# Patient Record
Sex: Female | Born: 1965 | Race: White | Hispanic: No | Marital: Single | Smoking: Current every day smoker
Health system: Southern US, Community
[De-identification: ages and names within clinical notes are randomized; demographics above are authoritative.]

## PROBLEM LIST (undated history)

## (undated) DIAGNOSIS — F419 Anxiety disorder, unspecified: Secondary | ICD-10-CM

## (undated) DIAGNOSIS — F32A Depression, unspecified: Secondary | ICD-10-CM

## (undated) DIAGNOSIS — E282 Polycystic ovarian syndrome: Secondary | ICD-10-CM

## (undated) DIAGNOSIS — G629 Polyneuropathy, unspecified: Secondary | ICD-10-CM

## (undated) DIAGNOSIS — M1990 Unspecified osteoarthritis, unspecified site: Secondary | ICD-10-CM

## (undated) DIAGNOSIS — R42 Dizziness and giddiness: Secondary | ICD-10-CM

## (undated) DIAGNOSIS — G43909 Migraine, unspecified, not intractable, without status migrainosus: Secondary | ICD-10-CM

## (undated) DIAGNOSIS — F988 Other specified behavioral and emotional disorders with onset usually occurring in childhood and adolescence: Secondary | ICD-10-CM

## (undated) DIAGNOSIS — E785 Hyperlipidemia, unspecified: Secondary | ICD-10-CM

## (undated) DIAGNOSIS — F329 Major depressive disorder, single episode, unspecified: Secondary | ICD-10-CM

## (undated) DIAGNOSIS — E119 Type 2 diabetes mellitus without complications: Secondary | ICD-10-CM

## (undated) DIAGNOSIS — H93A9 Pulsatile tinnitus, unspecified ear: Secondary | ICD-10-CM

## (undated) HISTORY — PX: WISDOM TOOTH EXTRACTION: SHX21

## (undated) HISTORY — DX: Pulsatile tinnitus, unspecified ear: H93.A9

## (undated) HISTORY — DX: Polycystic ovarian syndrome: E28.2

## (undated) HISTORY — DX: Hyperlipidemia, unspecified: E78.5

## (undated) HISTORY — DX: Dizziness and giddiness: R42

## (undated) HISTORY — DX: Major depressive disorder, single episode, unspecified: F32.9

## (undated) HISTORY — PX: BUNIONECTOMY: SHX129

## (undated) HISTORY — DX: Polyneuropathy, unspecified: G62.9

## (undated) HISTORY — DX: Unspecified osteoarthritis, unspecified site: M19.90

## (undated) HISTORY — DX: Depression, unspecified: F32.A

## (undated) HISTORY — DX: Anxiety disorder, unspecified: F41.9

## (undated) HISTORY — DX: Migraine, unspecified, not intractable, without status migrainosus: G43.909

## (undated) HISTORY — PX: OTHER SURGICAL HISTORY: SHX169

## (undated) HISTORY — DX: Type 2 diabetes mellitus without complications: E11.9

## (undated) HISTORY — DX: Other specified behavioral and emotional disorders with onset usually occurring in childhood and adolescence: F98.8

---

## 1997-08-13 ENCOUNTER — Other Ambulatory Visit: Admission: RE | Admit: 1997-08-13 | Discharge: 1997-08-13 | Payer: Self-pay | Admitting: Obstetrics and Gynecology

## 2000-02-18 ENCOUNTER — Encounter: Admission: RE | Admit: 2000-02-18 | Discharge: 2000-03-16 | Payer: Self-pay | Admitting: Family Medicine

## 2000-04-21 ENCOUNTER — Encounter: Admission: RE | Admit: 2000-04-21 | Discharge: 2000-07-20 | Payer: Self-pay | Admitting: Family Medicine

## 2001-02-28 ENCOUNTER — Encounter: Admission: RE | Admit: 2001-02-28 | Discharge: 2001-05-29 | Payer: Self-pay | Admitting: Family Medicine

## 2001-11-02 ENCOUNTER — Encounter: Admission: RE | Admit: 2001-11-02 | Discharge: 2002-01-31 | Payer: Self-pay | Admitting: Family Medicine

## 2003-02-12 ENCOUNTER — Encounter: Payer: Self-pay | Admitting: Obstetrics and Gynecology

## 2003-02-12 ENCOUNTER — Ambulatory Visit (HOSPITAL_COMMUNITY): Admission: RE | Admit: 2003-02-12 | Discharge: 2003-02-12 | Payer: Self-pay | Admitting: Obstetrics and Gynecology

## 2003-03-14 ENCOUNTER — Encounter: Admission: RE | Admit: 2003-03-14 | Discharge: 2003-06-12 | Payer: Self-pay | Admitting: Family Medicine

## 2003-09-14 ENCOUNTER — Emergency Department (HOSPITAL_COMMUNITY): Admission: EM | Admit: 2003-09-14 | Discharge: 2003-09-14 | Payer: Self-pay | Admitting: Family Medicine

## 2004-02-28 ENCOUNTER — Encounter: Admission: RE | Admit: 2004-02-28 | Discharge: 2004-04-30 | Payer: Self-pay | Admitting: Family Medicine

## 2004-04-21 ENCOUNTER — Ambulatory Visit (HOSPITAL_COMMUNITY): Admission: RE | Admit: 2004-04-21 | Discharge: 2004-04-21 | Payer: Self-pay | Admitting: Obstetrics and Gynecology

## 2005-02-12 ENCOUNTER — Encounter: Admission: RE | Admit: 2005-02-12 | Discharge: 2005-02-12 | Payer: Self-pay | Admitting: Family Medicine

## 2005-06-09 ENCOUNTER — Other Ambulatory Visit: Admission: RE | Admit: 2005-06-09 | Discharge: 2005-06-09 | Payer: Self-pay | Admitting: Obstetrics and Gynecology

## 2006-08-12 ENCOUNTER — Encounter: Admission: RE | Admit: 2006-08-12 | Discharge: 2006-08-12 | Payer: Self-pay | Admitting: Family Medicine

## 2007-01-05 ENCOUNTER — Other Ambulatory Visit: Admission: RE | Admit: 2007-01-05 | Discharge: 2007-01-05 | Payer: Self-pay | Admitting: Obstetrics and Gynecology

## 2007-01-26 ENCOUNTER — Encounter: Admission: RE | Admit: 2007-01-26 | Discharge: 2007-01-26 | Payer: Self-pay | Admitting: Obstetrics and Gynecology

## 2007-04-06 ENCOUNTER — Encounter: Admission: RE | Admit: 2007-04-06 | Discharge: 2007-05-18 | Payer: Self-pay | Admitting: Orthopaedic Surgery

## 2007-09-06 ENCOUNTER — Encounter: Admission: RE | Admit: 2007-09-06 | Discharge: 2007-09-06 | Payer: Self-pay | Admitting: Family Medicine

## 2008-01-27 ENCOUNTER — Encounter: Admission: RE | Admit: 2008-01-27 | Discharge: 2008-01-27 | Payer: Self-pay | Admitting: Family Medicine

## 2008-01-27 ENCOUNTER — Other Ambulatory Visit: Admission: RE | Admit: 2008-01-27 | Discharge: 2008-01-27 | Payer: Self-pay | Admitting: Obstetrics and Gynecology

## 2008-09-04 ENCOUNTER — Ambulatory Visit (HOSPITAL_COMMUNITY): Admission: RE | Admit: 2008-09-04 | Discharge: 2008-09-04 | Payer: Self-pay | Admitting: Family Medicine

## 2009-04-08 ENCOUNTER — Encounter: Admission: RE | Admit: 2009-04-08 | Discharge: 2009-04-08 | Payer: Self-pay | Admitting: Family Medicine

## 2009-05-03 ENCOUNTER — Other Ambulatory Visit: Admission: RE | Admit: 2009-05-03 | Discharge: 2009-05-03 | Payer: Self-pay | Admitting: Obstetrics and Gynecology

## 2009-10-19 ENCOUNTER — Encounter
Admission: RE | Admit: 2009-10-19 | Discharge: 2009-10-19 | Payer: Self-pay | Admitting: Physical Medicine and Rehabilitation

## 2009-10-23 ENCOUNTER — Encounter
Admission: RE | Admit: 2009-10-23 | Discharge: 2010-01-08 | Payer: Self-pay | Source: Home / Self Care | Admitting: Physical Medicine and Rehabilitation

## 2010-03-11 ENCOUNTER — Emergency Department (HOSPITAL_COMMUNITY): Admission: EM | Admit: 2010-03-11 | Discharge: 2010-03-11 | Payer: Self-pay | Admitting: Emergency Medicine

## 2010-06-08 ENCOUNTER — Encounter: Payer: Self-pay | Admitting: Family Medicine

## 2010-06-10 ENCOUNTER — Other Ambulatory Visit
Admission: RE | Admit: 2010-06-10 | Discharge: 2010-06-10 | Payer: Self-pay | Source: Home / Self Care | Admitting: Obstetrics and Gynecology

## 2010-06-10 ENCOUNTER — Other Ambulatory Visit: Payer: Self-pay | Admitting: Obstetrics and Gynecology

## 2010-06-10 ENCOUNTER — Encounter
Admission: RE | Admit: 2010-06-10 | Discharge: 2010-06-10 | Payer: Self-pay | Source: Home / Self Care | Attending: Family Medicine | Admitting: Family Medicine

## 2010-07-30 LAB — URINALYSIS, ROUTINE W REFLEX MICROSCOPIC
Bilirubin Urine: NEGATIVE
Glucose, UA: NEGATIVE mg/dL
Hgb urine dipstick: NEGATIVE
Ketones, ur: NEGATIVE mg/dL
Nitrite: NEGATIVE
Protein, ur: NEGATIVE mg/dL
Specific Gravity, Urine: 1.01 (ref 1.005–1.030)
Urobilinogen, UA: 0.2 mg/dL (ref 0.0–1.0)
pH: 5.5 (ref 5.0–8.0)

## 2010-07-30 LAB — DIFFERENTIAL
Basophils Absolute: 0.1 10*3/uL (ref 0.0–0.1)
Basophils Relative: 1 % (ref 0–1)
Eosinophils Absolute: 0.4 10*3/uL (ref 0.0–0.7)
Eosinophils Relative: 3 % (ref 0–5)
Lymphocytes Relative: 25 % (ref 12–46)
Lymphs Abs: 3.4 10*3/uL (ref 0.7–4.0)
Monocytes Absolute: 0.9 10*3/uL (ref 0.1–1.0)
Monocytes Relative: 7 % (ref 3–12)
Neutro Abs: 9.2 10*3/uL — ABNORMAL HIGH (ref 1.7–7.7)
Neutrophils Relative %: 66 % (ref 43–77)

## 2010-07-30 LAB — LIPASE, BLOOD: Lipase: 21 U/L (ref 11–59)

## 2010-07-30 LAB — COMPREHENSIVE METABOLIC PANEL
ALT: 20 U/L (ref 0–35)
AST: 25 U/L (ref 0–37)
Albumin: 3.3 g/dL — ABNORMAL LOW (ref 3.5–5.2)
Alkaline Phosphatase: 79 U/L (ref 39–117)
BUN: 7 mg/dL (ref 6–23)
CO2: 24 mEq/L (ref 19–32)
Calcium: 9.3 mg/dL (ref 8.4–10.5)
Chloride: 103 mEq/L (ref 96–112)
Creatinine, Ser: 0.64 mg/dL (ref 0.4–1.2)
GFR calc Af Amer: >60 mL/min (ref >60)
GFR calc non Af Amer: >60 mL/min (ref >60)
Glucose, Bld: 103 mg/dL — ABNORMAL HIGH (ref 70–99)
Potassium: 4.3 mEq/L (ref 3.5–5.1)
Sodium: 135 mEq/L (ref 135–145)
Total Bilirubin: 0.9 mg/dL (ref 0.3–1.2)
Total Protein: 7 g/dL (ref 6.0–8.3)

## 2010-07-30 LAB — GLUCOSE, CAPILLARY: Glucose-Capillary: 106 mg/dL — ABNORMAL HIGH (ref 70–99)

## 2010-07-30 LAB — CBC
HCT: 37.1 % (ref 36.0–46.0)
Hemoglobin: 12.3 g/dL (ref 12.0–15.0)
MCH: 26.8 pg (ref 26.0–34.0)
MCHC: 33.2 g/dL (ref 30.0–36.0)
MCV: 80.8 fL (ref 78.0–100.0)
Platelets: 344 10*3/uL (ref 150–400)
RBC: 4.59 MIL/uL (ref 3.87–5.11)
RDW: 12.9 % (ref 11.5–15.5)
WBC: 14 10*3/uL — ABNORMAL HIGH (ref 4.0–10.5)

## 2010-07-30 LAB — URINE MICROSCOPIC-ADD ON

## 2010-07-30 LAB — POCT PREGNANCY, URINE: Preg Test, Ur: NEGATIVE

## 2011-09-30 ENCOUNTER — Other Ambulatory Visit: Payer: Self-pay | Admitting: Family Medicine

## 2011-09-30 DIAGNOSIS — Z1231 Encounter for screening mammogram for malignant neoplasm of breast: Secondary | ICD-10-CM

## 2011-10-20 ENCOUNTER — Ambulatory Visit: Payer: Self-pay

## 2011-11-04 ENCOUNTER — Other Ambulatory Visit (HOSPITAL_COMMUNITY)
Admission: RE | Admit: 2011-11-04 | Discharge: 2011-11-04 | Disposition: A | Discharge status: Home / Self Care | Payer: Medicaid Other | Source: Ambulatory Visit | Attending: Obstetrics and Gynecology | Admitting: Obstetrics and Gynecology

## 2011-11-04 ENCOUNTER — Ambulatory Visit
Admission: RE | Admit: 2011-11-04 | Discharge: 2011-11-04 | Disposition: A | Discharge status: Home / Self Care | Payer: Medicaid Other | Source: Ambulatory Visit | Attending: Family Medicine | Admitting: Family Medicine

## 2011-11-04 ENCOUNTER — Other Ambulatory Visit: Payer: Self-pay | Admitting: Obstetrics and Gynecology

## 2011-11-04 DIAGNOSIS — Z01419 Encounter for gynecological examination (general) (routine) without abnormal findings: Secondary | ICD-10-CM | POA: Insufficient documentation

## 2011-11-04 DIAGNOSIS — Z1231 Encounter for screening mammogram for malignant neoplasm of breast: Secondary | ICD-10-CM

## 2011-11-04 DIAGNOSIS — Z1159 Encounter for screening for other viral diseases: Secondary | ICD-10-CM | POA: Insufficient documentation

## 2012-04-06 ENCOUNTER — Ambulatory Visit: Payer: Medicaid Other | Admitting: Physical Therapy

## 2012-10-03 ENCOUNTER — Other Ambulatory Visit: Payer: Self-pay

## 2012-10-03 DIAGNOSIS — Z1231 Encounter for screening mammogram for malignant neoplasm of breast: Secondary | ICD-10-CM

## 2012-11-07 ENCOUNTER — Ambulatory Visit: Payer: Medicaid Other

## 2012-11-16 ENCOUNTER — Other Ambulatory Visit: Payer: Self-pay | Admitting: Family Medicine

## 2012-12-06 ENCOUNTER — Other Ambulatory Visit (HOSPITAL_COMMUNITY)
Admission: RE | Admit: 2012-12-06 | Discharge: 2012-12-06 | Disposition: A | Discharge status: Home / Self Care | Payer: Medicaid Other | Source: Ambulatory Visit | Attending: Obstetrics and Gynecology | Admitting: Obstetrics and Gynecology

## 2012-12-06 ENCOUNTER — Ambulatory Visit
Admission: RE | Admit: 2012-12-06 | Discharge: 2012-12-06 | Disposition: A | Discharge status: Home / Self Care | Payer: Medicaid Other | Source: Ambulatory Visit

## 2012-12-06 ENCOUNTER — Other Ambulatory Visit: Payer: Self-pay | Admitting: Nurse Practitioner

## 2012-12-06 DIAGNOSIS — Z01419 Encounter for gynecological examination (general) (routine) without abnormal findings: Secondary | ICD-10-CM | POA: Insufficient documentation

## 2012-12-06 DIAGNOSIS — Z1231 Encounter for screening mammogram for malignant neoplasm of breast: Secondary | ICD-10-CM

## 2013-05-24 ENCOUNTER — Encounter: Payer: Self-pay | Admitting: *Deleted

## 2013-05-24 ENCOUNTER — Encounter: Payer: Medicaid Other | Attending: Internal Medicine | Admitting: *Deleted

## 2013-05-24 VITALS — Ht 68.0 in | Wt 378.0 lb

## 2013-05-24 DIAGNOSIS — E119 Type 2 diabetes mellitus without complications: Secondary | ICD-10-CM | POA: Insufficient documentation

## 2013-05-24 DIAGNOSIS — E282 Polycystic ovarian syndrome: Secondary | ICD-10-CM | POA: Insufficient documentation

## 2013-05-24 DIAGNOSIS — E785 Hyperlipidemia, unspecified: Secondary | ICD-10-CM | POA: Insufficient documentation

## 2013-05-24 DIAGNOSIS — F509 Eating disorder, unspecified: Secondary | ICD-10-CM

## 2013-05-24 DIAGNOSIS — E669 Obesity, unspecified: Secondary | ICD-10-CM | POA: Insufficient documentation

## 2013-05-24 DIAGNOSIS — Z713 Dietary counseling and surveillance: Secondary | ICD-10-CM | POA: Insufficient documentation

## 2013-05-24 NOTE — Progress Notes (Signed)
Patient was seen on 05/24/13 for nutrition counseling pertaining to disordered eating  Primary care MD: [name], Sadie Haber at [employer] Therapist: Cassandria Santee, [facility] for about a month Any other medical team members: Dr. Jacelyn Grip, ortho; Christena Deem, OB/GYN  Assessment Weight: 378lb  Height: 68in Expected body weight Percent expected body weight  Eating history: 1997 and 1999 children born and raised both alone.  Dated someone in 2000 and they broke up, but the kids got attached.  So she decided not to date anymore.  Food became her boyfriend.  Started talking to a guy online for years and he is her sounding board.  Son graduated hs and went off to [employer] and daughter is graduating so Camira wants to focus on herself now.  She still loves to eat, but she wants to live and wants to take care of herself.  Working on being kind to herself.  Her relationship with her son is strained.  He flunked out of college this semester and he is foundering.  She thinks he is bipolar.  There is a young woman staying with Chasta who is really appreciative of Kristy and kind.  This is a nice change for Mykael because her own children are not appreciative  Length of time: Started referring to food as her "boyfriend" is 63.  She has always been overweight and always been self-conscious about her weight.  However, she relationship with food got unhealthy when she used it to replace adult companionship.  Her online friend is her sounding board for health and weight Previous treatments: worked with RD here at [facility] prior to 2003 and that was a great relationship.  She got healthier options for her favorite foods. Saw another RD who wanted her to deal with the emotions and Jamee wasn't ready for that  Goals for RD meetings: identifying what foods cause what reactions and what emotions trigger eating those foods.  Building healthy relationships with food.     Patient had to leave early.  She will follow up 05/26/13

## 2013-05-26 ENCOUNTER — Encounter (INDEPENDENT_AMBULATORY_CARE_PROVIDER_SITE_OTHER): Payer: Self-pay

## 2013-05-26 ENCOUNTER — Encounter: Payer: Medicaid Other | Admitting: *Deleted

## 2013-05-26 DIAGNOSIS — M545 Low back pain, unspecified: Secondary | ICD-10-CM | POA: Insufficient documentation

## 2013-05-26 DIAGNOSIS — E282 Polycystic ovarian syndrome: Secondary | ICD-10-CM

## 2013-05-26 DIAGNOSIS — E119 Type 2 diabetes mellitus without complications: Secondary | ICD-10-CM

## 2013-05-26 DIAGNOSIS — F32A Depression, unspecified: Secondary | ICD-10-CM | POA: Insufficient documentation

## 2013-05-26 DIAGNOSIS — F329 Major depressive disorder, single episode, unspecified: Secondary | ICD-10-CM | POA: Insufficient documentation

## 2013-05-26 DIAGNOSIS — IMO0002 Reserved for concepts with insufficient information to code with codable children: Secondary | ICD-10-CM | POA: Insufficient documentation

## 2013-05-26 DIAGNOSIS — E785 Hyperlipidemia, unspecified: Secondary | ICD-10-CM | POA: Insufficient documentation

## 2013-05-26 DIAGNOSIS — E1165 Type 2 diabetes mellitus with hyperglycemia: Secondary | ICD-10-CM | POA: Insufficient documentation

## 2013-05-26 DIAGNOSIS — F509 Eating disorder, unspecified: Secondary | ICD-10-CM

## 2013-05-26 DIAGNOSIS — G43909 Migraine, unspecified, not intractable, without status migrainosus: Secondary | ICD-10-CM | POA: Insufficient documentation

## 2013-05-26 NOTE — Patient Instructions (Signed)
Talk with Dr. Inda Merlin about referral to Dr. [name] Aim for 3 meals each day at 4-5 hour intervals.  Try not to go more than 5 hours without eating If you binge, still get the next meal in (it can be small)

## 2013-05-26 NOTE — Progress Notes (Signed)
Patient was seen on 05/26/13 for nutrition counseling pertaining to disordered eating  Primary care MD: [name], Sadie Haber at [place]  Therapist: Cassandria Santee, [facility] for about a month  Any other medical team members: Dr. Jacelyn Grip, ortho; Christena Deem, OB/GYN   Assessment  Weight: 378lb  Height: 68in  Expected body weight  Percent expected body weight   Eating history: 1997 and 1999 children born and raised both alone. Dated someone in 2000 and they broke up, but the kids got attached. So she decided not to date anymore. Food became her boyfriend. Started talking to a guy online for years and he is her sounding board. Son graduated hs and went off to [employer] and daughter is graduating so Aleda wants to focus on herself now. She still loves to eat, but she wants to live and wants to take care of herself. Working on being kind to herself. Her relationship with her son is strained. He flunked out of college this semester and he is foundering. She thinks he is bipolar. There is a young woman staying with Jennesis who is really appreciative of Lance and kind. This is a nice change for Latonyia because her own children are not appreciative  Length of time: Started referring to food as her "boyfriend" is 30. She has always been overweight and always been self-conscious about her weight. However, she relationship with food got unhealthy when she used it to replace adult companionship. Her online friend is her sounding board for health and weight   Previous treatments: worked with RD here at [facility] prior to 2003 and that was a great relationship. She got healthier options for her favorite foods. Saw another RD who wanted her to deal with the emotions and Rosey wasn't ready for that  Goals for RD meetings: identifying what foods cause what reactions and what emotions trigger eating those foods. Building healthy relationships with food. Help with cooking and help managing chronic disease states.     Weight history:  Highest  weight: currently 378 lb   Lowest weight: 230 lb in her early 33s Most consistent weight: 230-290 in the 1990s then had kids and hovered around 300.  She quit smoking and then gained.  She is stable now What would you like to weigh:199 lb How has weight changed in the past year: stable this past year  Medical Information:  Changes in hair, skin, nails since ED started: hair feels thinner, but she has always had thin hair.  Dx PCOS 2003-2004 Chewing/swallowing difficulties: none Relux or heartburn: sometimes.  Peanut butter or lemon are triggers.  Goes untreated.  Sometimes eats celery for reflex Trouble with teeth: none LMP without the use of hormones: taking OCP for hormone replacement   Constipation, diarrhea: sometimes diarrhea.  Pasta and milk cause diarrhea she's noticed Has troubles with urination (incontenence).  Symptoms worsen with her periods.  She knows that some nerve issues can affect her bladder  Mental health diagnosis: hasn't been diagnosed.  But I suspect BED   Dietary assessment  Before kelsey it was not eating all day then eating a lot of food in evening. Then go to sleep.  Then woke back up and ate a bunch of food again.  Is currently trying to eat more structure.   Safe foods include: cheese, pasta Avoided foods include:none  24 hour recall: hasn't has anything so far today.  Kelsey recommended eating in the morning to prevent binges  Yesterday.  This is more normal since seeing kesley 11  am: Coffee with oatmeal Muffin 4: leftover panini 7: soup 12 am: grilled chicken sandwich Is trying to drink a lot of water Likes green tea.  Drinks a lot of this artificially sweetened  A normal binge is pasta, cheese, sub sandwich  She prefers vegetables steamed without cheese sauce.  She also doesn't like fruit with her meats.   Compensatory behaviors         Binge: daily.  Since starting with Merleen Nicely, she is binging less.  She is choosing not to and feels more full.    Nutrition Diagnosis: NB-1.5 Disordered eating pattern As related to binge eating disorder.  As evidenced by dietary recall.  Intervention/Goals: Discussed physiology of carbohydrate metabolism and how it is affected by PCOS.  Discussed hormonal imbalances associated with PCOS and how those imbalances present themselves with hirsutism, body acne, menstrual irregularity, obesity, and poor glycemic control.  Dicussed possible increased risk for CVD and the importance of nutrition management for overall health.   Goals: Talk with Dr. Inda Merlin about referral to Dr. [name] Aim for 3 meals each day at 4-5 hour intervals.  Try not to go more than 5 hours without eating If you binge, still get the next meal in (it can be small)   Monitoring and Evaluation: Patient will follow up in 1-2 weeks.

## 2013-06-07 ENCOUNTER — Encounter: Payer: Medicaid Other | Admitting: *Deleted

## 2013-06-07 ENCOUNTER — Ambulatory Visit: Payer: Medicaid Other | Admitting: *Deleted

## 2013-06-07 DIAGNOSIS — E119 Type 2 diabetes mellitus without complications: Secondary | ICD-10-CM

## 2013-06-07 DIAGNOSIS — E785 Hyperlipidemia, unspecified: Secondary | ICD-10-CM

## 2013-06-07 DIAGNOSIS — E669 Obesity, unspecified: Secondary | ICD-10-CM

## 2013-06-07 DIAGNOSIS — E282 Polycystic ovarian syndrome: Secondary | ICD-10-CM

## 2013-06-07 DIAGNOSIS — F509 Eating disorder, unspecified: Secondary | ICD-10-CM

## 2013-06-07 NOTE — Progress Notes (Signed)
Patient was seen on 06/07/13 for nutrition counseling pertaining to disordered eating  Primary care MD: Wayne Sever at [place]  Therapist: Cassandria Santee, [facility] for about a month  Any other medical team members: Dr. Jacelyn Grip, ortho; Christena Deem, OB/GYN; Philemon Kingdom, endo  Assessment  Weight: 378lb  Height: 68in   Gargi has been trying to eat more on a schedule (every 3-5 hours).  She has been trying to be more mindful of her internal hunger and fullness cues and honor them.  She still binges, but in smaller quantities.  She did well with mindfulness about a week and this past week has been hard because her children have been causing stress and taking Hollace away from her goals.  She has 4 other people living with her and she sleeps in the living room.  She has no private time or space.  She sleeps poorly, which affects her energy level, mood, glucose, hormones...etc and etc.  She knows she needs to improve her sleep, but she feels kinda hopeless.  Her overactive bladder causes issues during the day and night.  She has not set up Recovery Record yet.  It seems she isn't able to make time to eat mindfully during the day, then her kids come home and stress her out, then she binges at night. In order to reduce the binging, we must get more calories in during the day, and develop coping skills for her stress in the evenings.       Eating history: 1997 and 1999 children born and raised both alone. Dated someone in 2000 and they broke up, but the kids got attached. So she decided not to date anymore. Food became her boyfriend. Started talking to a guy online for years and he is her sounding board. Son graduated hs and went off to [employer] and daughter is graduating so Abbygayle wants to focus on herself now. She still loves to eat, but she wants to live and wants to take care of herself. Working on being kind to herself. Her relationship with her son is strained. He flunked out of college this semester and he  is foundering. She thinks he is bipolar. There is a young woman staying with Katrese who is really appreciative of Macaila and kind. This is a nice change for Estella because her own children are not appreciative  Length of time: Started referring to food as her "boyfriend" is 35. She has always been overweight and always been self-conscious about her weight. However, she relationship with food got unhealthy when she used it to replace adult companionship. Her online friend is her sounding board for health and weight   Previous treatments: worked with RD here at [facility] prior to 2003 and that was a great relationship. She got healthier options for her favorite foods. Saw another RD who wanted her to deal with the emotions and Zariana wasn't ready for that  Goals for RD meetings: identifying what foods cause what reactions and what emotions trigger eating those foods. Building healthy relationships with food. Help with cooking and help managing chronic disease states.     Weight history:  Highest weight: currently 378 lb   Lowest weight: 230 lb in her early 51s Most consistent weight: 230-290 in the 1990s then had kids and hovered around 300.  She quit smoking and then gained.  She is stable now What would you like to weigh:199 lb How has weight changed in the past year: stable this past year  Medical Information:  Changes in hair, skin, nails since ED started: hair feels thinner, but she has always had thin hair.  Dx PCOS 2003-2004 Chewing/swallowing difficulties: none Relux or heartburn: sometimes.  Peanut butter or lemon are triggers.  Goes untreated.  Sometimes eats celery for reflex Trouble with teeth: none LMP without the use of hormones: taking OCP for hormone replacement   Constipation, diarrhea: sometimes diarrhea.  Pasta and milk cause diarrhea she's noticed Has troubles with urination (incontenence).  Symptoms worsen with her periods.  She knows that some nerve issues can affect her  bladder  Mental health diagnosis: hasn't been diagnosed.  But I suspect BED   Dietary assessment  Before kelsey it was not eating all day then eating a lot of food in evening. Then go to sleep.  Then woke back up and ate a bunch of food again.  Is currently trying to eat more structure.   Safe foods include: cheese, pasta Avoided foods include:none  24 hour recall: hasn't has anything so far today.  Kelsey recommended eating in the morning to prevent binges  Yesterday.  This is more normal since seeing kesley 11 am: Coffee with oatmeal Muffin 4: leftover panini 7: soup 12 am: grilled chicken sandwich Is trying to drink a lot of water Likes green tea.  Drinks a lot of this artificially sweetened  A normal binge is pasta, cheese, sub sandwich  She prefers vegetables steamed without cheese sauce.  She also doesn't like fruit with her meats.   Compensatory behaviors         Binge: daily.  Since starting with Merleen Nicely, she is binging less.  She is choosing not to and feels more full.   Nutrition Diagnosis: NB-1.5 Disordered eating pattern As related to binge eating disorder.  As evidenced by dietary recall.  Intervention/Goals: Discussed effects of different foods on her energy level.  Recommended she monitor the pain in her abdomen.  Suggested OTC sleep aides   Goals: Take time for self every day, even just 20 minutes Aim for 3 meals each day at 4-5 hour intervals.  Try not to go more than 5 hours without eating If you binge, still get the next meal in (it can be small)   Monitoring and Evaluation: Patient will follow up in 1-2 weeks.

## 2013-06-09 ENCOUNTER — Ambulatory Visit (INDEPENDENT_AMBULATORY_CARE_PROVIDER_SITE_OTHER): Payer: Medicaid Other | Admitting: Internal Medicine

## 2013-06-09 ENCOUNTER — Encounter: Payer: Self-pay | Admitting: Internal Medicine

## 2013-06-09 VITALS — BP 128/68 | HR 114 | Temp 98.5°F | Resp 12 | Ht 67.5 in | Wt 379.7 lb

## 2013-06-09 DIAGNOSIS — E119 Type 2 diabetes mellitus without complications: Secondary | ICD-10-CM

## 2013-06-09 LAB — HEMOGLOBIN A1C: HEMOGLOBIN A1C: 7.7 % — AB (ref 4.6–6.5)

## 2013-06-09 MED ORDER — NORETHINDRONE 0.35 MG PO TABS: 1.0000 | ORAL_TABLET | Freq: Every day | ORAL | Status: DC

## 2013-06-09 NOTE — Progress Notes (Signed)
Patient ID:  COHICK, female   DOB: [birth year], 48 y.o.   MRN: [id number]  HPI: Alice Simpson is a 48 y.o.-year-old female, referred by her PCP, Dr. Inda Merlin, for management of DM2, non-insulin-dependent, uncontrolled, without complications and also for PCOS.  Patient has been diagnosed with diabetes in ~2007; she has not been on insulin before.  Last hemoglobin A1c was: 7.1% in 01/25/2013.  She had steroid inj in back: last in 02/2013.  Pt is on a regimen of: - Metformin XR 500 mg in am and 1500 mg at dinner - Januvia 100 mg - Glipizide XL10 mg   Pt checks her sugars 4x a week and they are: - am: 130-160 (212 this am: sick, late snack) - 2h after b'fast: n/c - before lunch: n/c - 2h after lunch: n/c - before dinner: n/c - 2h after dinner: n/c  - bedtime: n/c  No lows. Lowest sugar was 110; she has hypoglycemia awareness at 70.  Highest sugar was 300s after steroid inj in back.  She is seen by Ozzie Hoyle (nutrition) and also by Cassandria Santee (food therapist). Working on Medical illustrator portions. - Breakfast: oatmeal - Lunch: ? - Dinner: meat, vegetables, starch - Snacks: 4-5 x day  - no CKD, last BUN/creatinine: 11/0.61 (01/2013). She is on Losartan. - no lipid results available for review. She is on Zocor. - last eye exam was in 09/2012. No DR.  - + numbness and tingling in her  L 2nd toe.   Pt has FH of DM in mother, father.  She was dx with PCOS in 2007. She is on Ortho-micronor (norethindrone) and has withdrawal bleedings every month. She is on Metformin. She is close to menopause.  ROS: Constitutional: + weight gain, + fatigue, + hot flushes, + poor sleep, + nocturia Eyes: no blurry vision, no xerophthalmia ENT: no sore throat, no nodules palpated in throat, no dysphagia/odynophagia, no hoarseness Cardiovascular: no CP/SOB/palpitations/leg swelling Respiratory: no cough/SOB Gastrointestinal: no N/V/+ D/no C Musculoskeletal: + both: muscle/joint aches Skin:  no rashes, + hair loss Neurological: no tremors/numbness/tingling/dizziness, + HA Psychiatric: + depression/+ anxiety  Past Medical History  Diagnosis Date  . Diabetes mellitus without complication   . Hyperlipidemia   . Depression   . Anxiety   . Migraine   . Osteoarthritis   . Neuropathy   . PCOS (polycystic ovarian syndrome)    Past Surgical History  Procedure Laterality Date  . Cesarean section    . Wisdom    . Wisdom tooth extraction    . Bunionectomy     History   Social History  . Marital Status: Single    Spouse Name: N/A    Number of Children: 2   Occupational History  . unemployed   Social History Main Topics  . Smoking status: Former Smoker    Types: Cigarettes    Quit date: 2007  . Smokeless tobacco: Never Used  . Alcohol Use: No  . Drug Use: No   Current Outpatient Prescriptions on File Prior to Visit  Medication Sig Dispense Refill  . ALPRAZolam (XANAX) 0.5 MG tablet Take 0.5 mg by mouth as needed.       Marland Kitchen buPROPion (WELLBUTRIN XL) 150 MG 24 hr tablet 150 mg.      . FLUoxetine (PROZAC) 40 MG capsule Take 40 mg by mouth daily.       . Gabapentin, PHN, 300 MG TABS Increase as instructed up to 1 tablet (300 mg total) by mouth 3 times daily.      Marland Kitchen  glipiZIDE (GLUCOTROL) 10 MG tablet Take 10 mg by mouth daily before breakfast.       . losartan (COZAAR) 25 MG tablet Take 25 mg by mouth daily.       . Multiple Vitamin (MULTI-VITAMINS) TABS 1 tablet.      . simvastatin (ZOCOR) 40 MG tablet Take 40 mg by mouth daily at 6 PM.       . traZODone (DESYREL) 50 MG tablet Take 50 mg by mouth at bedtime.       Marland Kitchen HYDROcodone-acetaminophen (VICODIN) 5-500 MG per tablet Take 1 tablet by mouth every 6 (six) hours as needed.       . Meclizine HCl (TRAVEL SICKNESS) 25 MG CHEW 25 mg.      . metaxalone (SKELAXIN) 800 MG tablet Take 800 mg by mouth as needed.       . metFORMIN (GLUCOPHAGE) 500 MG tablet 500 mg 4 (four) times daily.       . ondansetron (ZOFRAN) 4 MG tablet  Take 4 mg by mouth as needed.        No current facility-administered medications on file prior to visit.   Allergies  Allergen Reactions  . Penicillin G Hives    hives  . Prochlorperazine Itching   Family History  Problem Relation Age of Onset  . Diabetes Other   . Depression Other   . Anxiety disorder Other    PE: BP 128/68  Pulse 114  Temp(Src) 98.5 F (36.9 C) (Oral)  Resp 12  Ht 5' 7.5" (1.715 m)  Wt 379 lb 11.2 oz (172.231 kg)  BMI 58.56 kg/m2  SpO2 97%  LMP 05/21/2013 Wt Readings from Last 3 Encounters:  06/09/13 379 lb 11.2 oz (172.231 kg)  05/24/13 378 lb (171.46 kg)   Constitutional: obese, in NAD Eyes: PERRLA, EOMI, no exophthalmos ENT: moist mucous membranes, no thyromegaly, no cervical lymphadenopathy Cardiovascular: RRR, No MRG, + periankle edema Respiratory: CTA B Gastrointestinal: abdomen soft, NT, ND, BS+ Musculoskeletal: no deformities, strength intact in all 4 Skin: moist, warm, no rashes Neurological: no tremor with outstretched hands, DTR normal in all 4  ASSESSMENT: 1. DM2, non-insulin-dependent, uncontrolled, without complications  2. PCOS - on Norethindrone - on metformin  PLAN:  1. Patient with long-standing, recently more uncontrolled diabetes, on oral antidiabetic regimen, which became insufficient - We discussed about options for treatment, and I suggested to:  Patient Instructions  Please continue current regimen for diabetes, but start document the sugars throughout the day as discussed.  Please return in 1.5 months with your sugar log.  Please stop at the lab.  - Strongly advised her to start checking sugars at different times of the day - check 1-2 times a day, rotating checks - given sugar log and advised how to fill it and to bring it at next appt  - given foot care handout and explained the principles  - given instructions for hypoglycemia management "15-15 rule"  - advised for yearly eye exams - she is up to date - Return  to clinic in 1.5 mo with sugar log   2. PCOS I discussed with the patient about the fact that the PCOS is a misnomer, a patient does not necessarily have to have polycystic ovaries to be diagnosed with the disorder. This is of sum of several conditions, including:  weight gain  insulin resistance (and therefore a higher risk of developing diabetes)  acne  hirsutism  irregular menstrual cycles  decreased fertility. - We also discussed about the  fact that the treatment is usually targeted to addressing the problem that concerns the patient the most: acne/hirsutism, weight gain, or fertility, but there is no single treatment for PCOS.  - The first-line therapy are oral contraceptives (she is on Orthomicronor). If weight concerns, + metformin (which she is already on, for her DM); she does not have major concerns about acne/hirsutism and she is not interested in fertility.  - We discussed that the features of PCOS dissipate around time of menopause, and she is not far from this - does better on the OCP without the estrogen; advised to stop OCP at 48 y/o  Office Visit on 06/09/2013  Component Date Value Range Status  . Hemoglobin A1C 06/09/2013 7.7* 4.6 - 6.5 % Final   Glycemic Control Guidelines for People with Diabetes:Non Diabetic:  <6%Goal of Therapy: <7%Additional Action Suggested:  >8%    HbA1c higher, but she started to make healthy changes in her diet. I will not change regimen for now, but will wait to see her sugar log in 1.5 mo when she comes back. I think we need to change Januvia to Victoza then, depending on the sugars.

## 2013-06-09 NOTE — Patient Instructions (Signed)
Please continue current regimen for diabetes, but start document the sugars throughout the day as discussed.  Please return in 1.5 months with your sugar log.  Please stop at the lab.   PATIENT INSTRUCTIONS FOR TYPE 2 DIABETES:  **Please join MyChart!** - see attached instructions about how to join   DIET AND EXERCISE Diet and exercise is an important part of diabetic treatment.  We recommended aerobic exercise in the form of brisk walking (working between 40-60% of maximal aerobic capacity, similar to brisk walking) for 150 minutes per week (such as 30 minutes five days per week) along with 3 times per week performing 'resistance' training (using various gauge rubber tubes with handles) 5-10 exercises involving the major muscle groups (upper body, lower body and core) performing 10-15 repetitions (or near fatigue) each exercise. Start at half the above goal but build slowly to reach the above goals. If limited by weight, joint pain, or disability, we recommend daily walking in a swimming pool with water up to waist to reduce pressure from joints while allow for adequate exercise.    BLOOD GLUCOSES Monitoring your blood glucoses is important for continued management of your diabetes. Please check your blood glucoses 2-4 times a day: fasting, before meals and at bedtime (you can rotate these measurements - e.g. one day check before the 3 meals, the next day check before 2 of the meals and before bedtime, etc.   HYPOGLYCEMIA (low blood sugar) Hypoglycemia is usually a reaction to not eating, exercising, or taking too much insulin/ other diabetes drugs.  Symptoms include tremors, sweating, hunger, confusion, headache, etc. Treat IMMEDIATELY with 15 grams of Carbs:   4 glucose tablets    cup regular juice/soda   2 tablespoons raisins   4 teaspoons sugar   1 tablespoon honey Recheck blood glucose in 15 mins and repeat above if still symptomatic/blood glucose <100. Please contact our office at  [phone] if you have questions about how to next handle your insulin.  RECOMMENDATIONS TO REDUCE YOUR RISK OF DIABETIC COMPLICATIONS: * Take your prescribed MEDICATION(S). * Follow a DIABETIC diet: Complex carbs, fiber rich foods, heart healthy fish twice weekly, (monounsaturated and polyunsaturated) fats * AVOID saturated/trans fats, high fat foods, >2,300 mg salt per day. * EXERCISE at least 5 times a week for 30 minutes or preferably daily.  * DO NOT SMOKE OR DRINK more than 1 drink a day. * Check your FEET every day. Do not wear tightfitting shoes. Contact us if you develop an ulcer * See your EYE doctor once a year or more if needed * Get a FLU shot once a year * Get a PNEUMONIA vaccine once before and once after age 20 years  GOALS:  * Your Hemoglobin A1c of <7%  * fasting sugars need to be <130 * after meals sugars need to be <180 (2h after you start eating) * Your Systolic BP should be 932 or lower  * Your Diastolic BP should be 80 or lower  * Your HDL (Good Cholesterol) should be 40 or higher  * Your LDL (Bad Cholesterol) should be 100 or lower  * Your Triglycerides should be 150 or lower  * Your Urine microalbumin (kidney function) should be <30 * Your Body Mass Index should be 25 or lower   We will be glad to help you achieve these goals. Our telephone number is: [phone].

## 2013-06-15 ENCOUNTER — Ambulatory Visit: Payer: Medicaid Other | Admitting: *Deleted

## 2013-06-22 ENCOUNTER — Ambulatory Visit: Payer: Medicaid Other | Admitting: *Deleted

## 2013-06-29 ENCOUNTER — Encounter: Payer: Medicaid Other | Attending: Internal Medicine | Admitting: *Deleted

## 2013-06-29 DIAGNOSIS — E119 Type 2 diabetes mellitus without complications: Secondary | ICD-10-CM | POA: Insufficient documentation

## 2013-06-29 DIAGNOSIS — Z713 Dietary counseling and surveillance: Secondary | ICD-10-CM | POA: Insufficient documentation

## 2013-06-29 DIAGNOSIS — E669 Obesity, unspecified: Secondary | ICD-10-CM

## 2013-06-29 DIAGNOSIS — E282 Polycystic ovarian syndrome: Secondary | ICD-10-CM | POA: Insufficient documentation

## 2013-06-29 DIAGNOSIS — F509 Eating disorder, unspecified: Secondary | ICD-10-CM | POA: Insufficient documentation

## 2013-06-29 DIAGNOSIS — E785 Hyperlipidemia, unspecified: Secondary | ICD-10-CM | POA: Insufficient documentation

## 2013-06-29 NOTE — Progress Notes (Signed)
Patient was seen on 06/29/13 for nutrition counseling pertaining to disordered eating  Primary care MD: [name] Therapist: Cassandria Santee Any other medical team members: Philemon Kingdom, endocrinology  Assessment: 3 days of overeating, but didn't feel stuffed.  Dealing with feelings of betrayal of son form argument and didn't realize it.   Her glucose readings have been elevated.  If she uses smaller plate she eats less. Being able to figure out her emotions were enabled her to stop overeating because she knew what was going on.  She wants to be healthier. Short-term goal of regularly scheduled meals.  She has bought some healthier cereal and snack options  B: instant oatmeal or cream of wheat with fruit (applesauce) and dairy.  Yogurt or cottage cheese and coffee with sugar and cream.  Eats most days 4 out of 7 days  Weight 379 lb Height 67 in Expected body weight:160 Percent expected body weight: 237%  Mental health diagnosis: BED  Dietary assessment: A typical day consists of 2-3 meals and sometimes snacks  Safe foods include: all Avoided foods include:none  24 hour recall:  Ate footlong sub 3 days in a row   Compensatory behaviors: none currently           Binged 3 days in a row last week, but that was not normal. She's been binging less    Estimated energy needs: 2400 kcal 270 g CHO 180 g pro 67 g fat  Nutrition Diagnosis: NB-1.5 Disordered eating pattern As related to binge eating disorder. As evidenced by dietary recall.   Intervention/Goals: Aim for 4 carb choices/meal plus 1-3 protein exchanges /meal.   Monitor blood glucose more regularly per Dr. Chrissie Noa instructions   Meal plan:    3 meals    0 snacks To provide 2400 kcal    270 g CHO    180 g pro   67 g fat    Monitoring and Evaluation: Patient will follow up in 2 weeks.

## 2013-07-13 ENCOUNTER — Ambulatory Visit: Payer: Medicaid Other | Admitting: *Deleted

## 2013-07-18 ENCOUNTER — Ambulatory Visit: Payer: Medicaid Other | Admitting: Internal Medicine

## 2013-08-01 ENCOUNTER — Encounter: Payer: Self-pay | Admitting: Internal Medicine

## 2013-08-01 ENCOUNTER — Ambulatory Visit (INDEPENDENT_AMBULATORY_CARE_PROVIDER_SITE_OTHER): Payer: Medicaid Other | Admitting: Internal Medicine

## 2013-08-01 VITALS — BP 128/64 | HR 127 | Temp 99.1°F | Resp 16 | Wt 370.0 lb

## 2013-08-01 DIAGNOSIS — E119 Type 2 diabetes mellitus without complications: Secondary | ICD-10-CM

## 2013-08-01 MED ORDER — GLIPIZIDE 10 MG PO TABS: 10.0000 mg | ORAL_TABLET | Freq: Every day | ORAL | Status: DC

## 2013-08-01 MED ORDER — METFORMIN HCL ER 500 MG PO TB24: 1000.0000 mg | ORAL_TABLET | Freq: Two times a day (BID) | ORAL | Status: DC

## 2013-08-01 MED ORDER — SITAGLIPTIN PHOSPHATE 100 MG PO TABS: 100.0000 mg | ORAL_TABLET | Freq: Every day | ORAL | Status: DC

## 2013-08-01 NOTE — Progress Notes (Signed)
Patient ID: Alice Simpson, female   DOB: 07-11-1965, 48 y.o.   MRN: [id number]  HPI: Alice Simpson is a 48 y.o.-year-old female, initially referred by her PCP, Dr. Inda Merlin, for management of DM2, non-insulin-dependent, dx ~ 2007, uncontrolled, without complications. She also has PCOS, which was not addressed at this visit. Last visit 2 mo ago.  Last hemoglobin A1c was:  Lab Results  Component Value Date   HGBA1C 7.7* 06/09/2013  HbA1c 7.1% in 01/25/2013. She had steroid inj in back: last in 02/2013.  Pt is on a regimen of: - Metformin XR 500 mg in am and 1500 mg at dinner - Januvia 100 mg - Glipizide XL10 mg   Pt checks her sugars 1-3x a day and they are: - am: 130-160 (212 this am: sick, late snack) >> 109-201 (130-170) - 2h after b'fast: n/c - before lunch: n/c >> 129, 171 - 2h after lunch: n/c >> 145 - before dinner: n/c >> 102-135 (227) - 2h after dinner: n/c >> 113, 118, 167, 179 - bedtime: n/c >> 136, 151, 152, 174 - nighttime: 131, 204 No lows. Lowest sugar was 105; she has hypoglycemia awareness at 70.  Highest sugar was 227.   She is seen by Ozzie Hoyle (nutrition) and also by Cassandria Santee (food therapist). Working on Medical illustrator portions and improving diet. Her high CBGs are usually when she overeats or eats concentrated sweets. She lost 9 lbs since last visit!  - no CKD, last BUN/creatinine: 11/0.61 (01/2013). She is on Losartan. - no lipid results available for review. She is on Zocor. - last eye exam was in 09/2012. No DR.  - + numbness and tingling in her  L 2nd toe.   She was dx with PCOS in 2007. She is on Ortho-micronor (norethindrone) and has withdrawal bleedings every month. She is on Metformin. She is close to menopause.  ROS: Constitutional: + weight loss, + fatigue, + hot flushes, + poor sleep, + nocturia Eyes: no blurry vision, no xerophthalmia ENT: no sore throat, no nodules palpated in throat, no dysphagia/odynophagia, no  hoarseness Cardiovascular: no CP/SOB/palpitations/leg swelling Respiratory: no cough/SOB Gastrointestinal: no N/V/+ D/no C Musculoskeletal: + both: muscle/joint aches Skin: no rashes Neurological: no tremors/numbness/tingling/dizziness  I reviewed pt's medications, allergies, PMH, social hx, family hx and no changes required, except as mentioned above.  PE: BP 128/64  Pulse 127  Temp(Src) 99.1 F (37.3 C) (Oral)  Resp 16  Wt 370 lb (167.831 kg)  SpO2 96% Wt Readings from Last 3 Encounters:  08/01/13 370 lb (167.831 kg)  06/09/13 379 lb 11.2 oz (172.231 kg)  05/24/13 378 lb (171.46 kg)   Constitutional: obese - gynoid distribution, in NAD Eyes: PERRLA, EOMI, no exophthalmos ENT: moist mucous membranes, no thyromegaly, no cervical lymphadenopathy Cardiovascular: RRR, No MRG, + periankle edema Respiratory: CTA B Gastrointestinal: abdomen soft, NT, ND, BS+ Musculoskeletal: no deformities, strength intact in all 4 Skin: moist, warm, no rashes  ASSESSMENT: 1. DM2, non-insulin-dependent, uncontrolled, without complications  2. PCOS - on Norethindrone - on metformin  PLAN:  1. Patient with long-standing, recently more uncontrolled diabetes, on oral antidiabetic regimen. She is instituting healthy changes in her diet and lost 9 lbs since last visit. Her sugars are not all at goal, but she is doing a great job working with nutrition.  - We discussed about options for treatment, and I suggested to stay on the same regimen for now. She is still learning what she can and cannot eat and how foods  influence sugars.   Patient Instructions  Keep up the great work! Continue current regimen. Please come back for a follow-up appointment in 3 months with your sugar meds. - continue checking sugars at different times of the day - check 1-2 times a day, rotating checks - advised for yearly eye exams - she is up to date - Return to clinic in 3 mo with sugar log

## 2013-08-01 NOTE — Patient Instructions (Signed)
Keep up the great work! Continue current regimen. Please come back for a follow-up appointment in 3 months with your sugar meds.

## 2013-08-02 DIAGNOSIS — E282 Polycystic ovarian syndrome: Secondary | ICD-10-CM | POA: Insufficient documentation

## 2013-08-10 ENCOUNTER — Encounter: Payer: Medicaid Other | Attending: Internal Medicine | Admitting: *Deleted

## 2013-08-10 DIAGNOSIS — E669 Obesity, unspecified: Secondary | ICD-10-CM | POA: Insufficient documentation

## 2013-08-10 DIAGNOSIS — F509 Eating disorder, unspecified: Secondary | ICD-10-CM

## 2013-08-10 DIAGNOSIS — Z713 Dietary counseling and surveillance: Secondary | ICD-10-CM | POA: Insufficient documentation

## 2013-08-10 DIAGNOSIS — E785 Hyperlipidemia, unspecified: Secondary | ICD-10-CM

## 2013-08-10 DIAGNOSIS — E282 Polycystic ovarian syndrome: Secondary | ICD-10-CM | POA: Insufficient documentation

## 2013-08-10 DIAGNOSIS — E119 Type 2 diabetes mellitus without complications: Secondary | ICD-10-CM | POA: Insufficient documentation

## 2013-08-10 NOTE — Progress Notes (Signed)
Patient was seen on 08/10/13 for nutrition counseling pertaining to disordered eating  Primary care MD: [name] Therapist: Cassandria Santee Any other medical team members: Philemon Kingdom, endocrinology  Assessment: Has been to see Dr. [name] and Kristin Bruins reports that her glucose readings have improved.  Shawndra also reports losing 8 pounds in between endo visits.  She has been trying to eat healthier and teaching her daughter to be healthier. She has been anxious about her daughter's oral surgery and she was stress eating.  So far the surgery has gone fine.  She continues to follow a disordered eating pattern of skipping meals (sometime unintentionally because she forgets to eat or she's not at home).  She is very anxious about honoring her body through nutrition.  She got physically anxious in session when we talked about setting regularly scheduled meals.    Weight 370 lb Height 67 in Expected body weight:160 Percent expected body weight: 231%  Mental health diagnosis: BED  Dietary assessment: A typical day consists of 2-3 meals and sometimes snacks  Safe foods include: all Avoided foods include:none   Compensatory behaviors: none currently           She's been binging less   Estimated energy needs: 2400 kcal 270 g CHO 180 g pro 67 g fat  Nutrition Diagnosis: NB-1.5 Disordered eating pattern As related to binge eating disorder. As evidenced by dietary recall.   Intervention/Goals: Aim for 4 carb choices/meal plus 1-3 protein exchanges /meal.   Monitor blood glucose more regularly per Dr. Chrissie Noa instructions   Meal plan:    3 meals    0 snacks To provide 2400 kcal    270 g CHO    180 g pro   67 g fat  Goals: Set alarm on phone to go off: 9:30 am; 1:30 pm, and 7 pm When it goes off EAT  Keep snack in purse/car in case you're not at home: peanut butter cracker or protein bar  If at home sit at the table without distractions: no tv, book, phone, magazine, computer,  etc  Monitoring and Evaluation: Patient will follow up in 2 weeks.

## 2013-08-10 NOTE — Patient Instructions (Signed)
mindfullness bell is the app for that  Set alarm on phone to go off: 9:30 am; 1:30 pm, and 7 pm When it goes off EAT  Keep snack in purse/car in case you're not at home: peanut butter cracker or protein bar  If at home sit at the table without distractions: no tv, book, phone, magazine, computer, etc  Eat whatever you want as long as it's 2 cups worth

## 2013-08-24 ENCOUNTER — Encounter: Payer: Medicaid Other | Attending: Internal Medicine | Admitting: *Deleted

## 2013-08-24 ENCOUNTER — Telehealth: Payer: Self-pay | Admitting: *Deleted

## 2013-08-24 DIAGNOSIS — E785 Hyperlipidemia, unspecified: Secondary | ICD-10-CM

## 2013-08-24 DIAGNOSIS — E282 Polycystic ovarian syndrome: Secondary | ICD-10-CM | POA: Insufficient documentation

## 2013-08-24 DIAGNOSIS — F509 Eating disorder, unspecified: Secondary | ICD-10-CM

## 2013-08-24 DIAGNOSIS — Z713 Dietary counseling and surveillance: Secondary | ICD-10-CM | POA: Insufficient documentation

## 2013-08-24 DIAGNOSIS — E669 Obesity, unspecified: Secondary | ICD-10-CM | POA: Insufficient documentation

## 2013-08-24 DIAGNOSIS — E119 Type 2 diabetes mellitus without complications: Secondary | ICD-10-CM | POA: Insufficient documentation

## 2013-08-24 NOTE — Progress Notes (Signed)
Patient was seen on 08/24/13 for nutrition counseling pertaining to disordered eating  Primary care MD: [name] Therapist: Cassandria Santee Any other medical team members: Philemon Kingdom, endocrinology  Assessment: has been logging glucose, meals, and hunger/fullness.  Finds it helpful.  Saw kelsey earlier.  was very anxious this weekend.  dealign with her daughter and the girl who lives with her.  Didn't eat through anxiety!!! Let herself feel.    She needs a purpose.  She has been getting up and eating regularly scheduled meals.  She has been busy taking care of 2 young girls and has been more active with them.  Her glucose readings are much improved and she feels better.  She has better energy and she has less pain.  She is "trying to lose for this Luvenia Heller" but she knows she needs to get healthy for herself.     Weight 370 lb Height 67 in Expected body weight:160 Percent expected body weight: 231%  Mental health diagnosis: BED  Dietary assessment: A typical day consists of 2-3 meals and sometimes snacks  Safe foods include: all Avoided foods include:none   Compensatory behaviors: none currently           She's been binging less   Estimated energy needs: 2400 kcal 270 g CHO 180 g pro 67 g fat  Nutrition Diagnosis: NB-1.5 Disordered eating pattern As related to binge eating disorder. As evidenced by dietary recall.   Intervention/Goals: Aim for 4 carb choices/meal plus 1-3 protein exchanges /meal.   Monitor blood glucose more regularly per Dr. Chrissie Noa instructions   Meal plan:    3 meals    0 snacks To provide 2400 kcal    270 g CHO    180 g pro   67 g fat  Continue previous Goals: Set alarm on phone to go off: 9:30 am; 1:30 pm, and 7 pm When it goes off EAT  Keep snack in purse/car in case you're not at home: peanut butter cracker or protein bar  If at home sit at the table without distractions: no tv, book, phone, magazine, computer, etc  Monitoring and  Evaluation: Patient will follow up in 2 weeks.

## 2013-09-07 ENCOUNTER — Ambulatory Visit: Payer: Medicaid Other | Attending: Physical Medicine and Rehabilitation

## 2013-09-07 ENCOUNTER — Encounter: Payer: Medicaid Other | Admitting: *Deleted

## 2013-09-07 VITALS — Wt 364.0 lb

## 2013-09-07 DIAGNOSIS — R5381 Other malaise: Secondary | ICD-10-CM | POA: Insufficient documentation

## 2013-09-07 DIAGNOSIS — E119 Type 2 diabetes mellitus without complications: Secondary | ICD-10-CM

## 2013-09-07 DIAGNOSIS — E785 Hyperlipidemia, unspecified: Secondary | ICD-10-CM

## 2013-09-07 DIAGNOSIS — M545 Low back pain, unspecified: Secondary | ICD-10-CM | POA: Insufficient documentation

## 2013-09-07 DIAGNOSIS — IMO0001 Reserved for inherently not codable concepts without codable children: Secondary | ICD-10-CM | POA: Insufficient documentation

## 2013-09-07 DIAGNOSIS — F509 Eating disorder, unspecified: Secondary | ICD-10-CM

## 2013-09-07 DIAGNOSIS — E669 Obesity, unspecified: Secondary | ICD-10-CM

## 2013-09-07 DIAGNOSIS — E282 Polycystic ovarian syndrome: Secondary | ICD-10-CM

## 2013-09-07 NOTE — Progress Notes (Signed)
Patient was seen on 09/07/13 for nutrition counseling pertaining to disordered eating  Primary care MD: [name] Therapist: Cassandria Santee Any other medical team members: Philemon Kingdom, endocrinology  Assessment: Her glucose readings have been much better!!!!  She is changing her birth control temporarily Her son isn't graduating soon, but she still wants to go see her guy. She was really lonely this weekend, but food didn't help.  She smoked a cigarette and drank a little.  She talked to [employer] for 4 hours.   Starting feeling hungry in the am.  Has been more tired lately and sometimes ignores her hunger cues in the morning.  She hasn't cooked dinner in several weeks.  Today she ate breakfast and has chicken in the crockpot.  She's eating consistent meals much better now and she's lost weight and her glucose readings are down.  She feels much better.  Binge free for several weeks!  Breakfast: belvita or breakfast crackers with cottage cheese Lunch: sandwich with more meat.  Going to PT today and will start doing exercises at home   She needs a purpose.  She has been getting up and eating regularly scheduled meals.  She has been busy taking care of 2 young girls and has been more active with them.  Her glucose readings are much improved and she feels better.  She has better energy and she has less pain.  She is "trying to lose for this Luvenia Heller" but she knows she needs to get healthy for herself.     Weight 364 lb Height 67 in Expected body weight:160 Percent expected body weight: 228%  Mental health diagnosis: BED   Estimated energy needs: 2400 kcal 270 g CHO 180 g pro 67 g fat  Nutrition Diagnosis: NB-1.5 Disordered eating pattern As related to binge eating disorder. As evidenced by dietary recall.   Intervention/Goals: Aim for 4 carb choices/meal plus 1-3 protein exchanges /meal.   Monitor blood glucose more regularly per Dr. Chrissie Noa instructions   Meal plan:    3  meals    0 snacks To provide 2000 kcal    225 g CHO    150 g pro   56 g fat  Continue previous Goals: Set alarm on phone to go off: 9:30 am; 1:30 pm, and 7 pm When it goes off EAT  Keep snack in purse/car in case you're not at home: peanut butter cracker or protein bar  If at home sit at the table without distractions: no tv, book, phone, magazine, computer, etc  Monitoring and Evaluation: Patient will follow up in 2 weeks.   I know what I feel; I'm kind to my feelings; I can self-heal

## 2013-09-15 ENCOUNTER — Encounter: Payer: Medicaid Other | Attending: Internal Medicine | Admitting: *Deleted

## 2013-09-15 VITALS — Ht 68.0 in | Wt 364.0 lb

## 2013-09-15 DIAGNOSIS — E282 Polycystic ovarian syndrome: Secondary | ICD-10-CM | POA: Insufficient documentation

## 2013-09-15 DIAGNOSIS — E119 Type 2 diabetes mellitus without complications: Secondary | ICD-10-CM | POA: Insufficient documentation

## 2013-09-15 DIAGNOSIS — E669 Obesity, unspecified: Secondary | ICD-10-CM

## 2013-09-15 DIAGNOSIS — Z713 Dietary counseling and surveillance: Secondary | ICD-10-CM | POA: Insufficient documentation

## 2013-09-15 DIAGNOSIS — E785 Hyperlipidemia, unspecified: Secondary | ICD-10-CM | POA: Insufficient documentation

## 2013-09-15 DIAGNOSIS — F509 Eating disorder, unspecified: Secondary | ICD-10-CM

## 2013-09-15 NOTE — Patient Instructions (Signed)
Check out [email or web address]

## 2013-09-18 NOTE — Progress Notes (Signed)
Assessment: talked mostly about Alice Simpson and her social life.  She had a rough couple of days, but is back on her meal plan now  Diagnosis: Excessive energy intake as related to history of binging as evidenced by BMI>30  Intervention: continue 3 meals each day: protein, starch, fruit or vegetable.  Look into [email or web address] for increased socialization  Monitoring: 1-2 weeks

## 2013-09-29 ENCOUNTER — Encounter: Payer: Medicaid Other | Admitting: *Deleted

## 2013-09-29 VITALS — Wt 367.0 lb

## 2013-09-29 DIAGNOSIS — F509 Eating disorder, unspecified: Secondary | ICD-10-CM

## 2013-09-29 DIAGNOSIS — E785 Hyperlipidemia, unspecified: Secondary | ICD-10-CM

## 2013-09-29 DIAGNOSIS — E282 Polycystic ovarian syndrome: Secondary | ICD-10-CM

## 2013-09-29 DIAGNOSIS — E119 Type 2 diabetes mellitus without complications: Secondary | ICD-10-CM

## 2013-09-29 NOTE — Progress Notes (Signed)
Assessment:  Went to meetups, but didn't like any of the groups.  [place] is on shut down due to weirdos Started new birth control pills and they didn't do well- affected her mood and her eating and she was miserable.  She stopped taking them.  Talked about the men in her life Alice Simpson) and how she deserve better and how he's served his purpose, but now she needs to move forward and have healthy, meaningful relationships.    Ran out of time to talk about foods, but she did mention she went to [facility] and got more produce and is trying new things.    Nutrition Diagnosis: Excessive energy intake as related to history of binging as evidenced by BMI>30   Intervention:  [employer] yoga or water aerobics; farmers' market.  Meal plan card (3 carb choices/meal)  Monitoring: will follow up in 1 week

## 2013-10-06 ENCOUNTER — Encounter: Payer: Medicaid Other | Admitting: *Deleted

## 2013-10-06 VITALS — Wt 365.0 lb

## 2013-10-06 DIAGNOSIS — E669 Obesity, unspecified: Secondary | ICD-10-CM

## 2013-10-06 DIAGNOSIS — F509 Eating disorder, unspecified: Secondary | ICD-10-CM

## 2013-10-06 DIAGNOSIS — E119 Type 2 diabetes mellitus without complications: Secondary | ICD-10-CM

## 2013-10-06 DIAGNOSIS — E282 Polycystic ovarian syndrome: Secondary | ICD-10-CM

## 2013-10-06 DIAGNOSIS — E785 Hyperlipidemia, unspecified: Secondary | ICD-10-CM

## 2013-10-06 NOTE — Progress Notes (Signed)
Assessment:   B: granola bar or Belvita crackers.  Yesterday 3 eggs and 3 sausage and glass of OJ L: tomato sandwich with miracle whip S: chocolate ice cream with pretzel sticks.  1/2 doughnut and banana  D: [place] breast tenderloins with peas S: 2 jello cups and applesauce and apple Diet tea or water  Hasn't been checking her glucose as much lately.  A couple of weeks ago they were in the 130s  Diagnosis: Excessive energy intake as related to history of binging as evidenced by BMI>30   Intervention: Increase protein and calories during the day to avoid late night binges B: 2 eggs, 2 sausage, 1 whole fruit L: meat sandwich with yogurt S: peanut butter crackers D: meat and vegetables  Monitoring: 2 weeks

## 2013-10-20 ENCOUNTER — Encounter: Payer: Medicaid Other | Attending: Internal Medicine | Admitting: *Deleted

## 2013-10-20 VITALS — Wt 365.8 lb

## 2013-10-20 DIAGNOSIS — E669 Obesity, unspecified: Secondary | ICD-10-CM

## 2013-10-20 DIAGNOSIS — E119 Type 2 diabetes mellitus without complications: Secondary | ICD-10-CM

## 2013-10-20 DIAGNOSIS — Z713 Dietary counseling and surveillance: Secondary | ICD-10-CM | POA: Insufficient documentation

## 2013-10-20 DIAGNOSIS — E785 Hyperlipidemia, unspecified: Secondary | ICD-10-CM | POA: Insufficient documentation

## 2013-10-20 DIAGNOSIS — E282 Polycystic ovarian syndrome: Secondary | ICD-10-CM

## 2013-10-20 DIAGNOSIS — F509 Eating disorder, unspecified: Secondary | ICD-10-CM

## 2013-10-20 NOTE — Progress Notes (Signed)
Assessment:   Has been snacking on cheese and nuts.  Likes ham and swiss roll ups.  Hasn't been checking her glucose as much lately.  Her eating hasn't gotten back on schedule since returning to town    Diagnosis: Excessive energy intake as related to history of binging as evidenced by BMI>30   Intervention: Diabetes Support Group (this coming Monday ) Second Monday of each month from 6-7 pm.  However the doors lock at 6 so try to get here early.   Come here.    Let's try to get back in a normal routine: B: Greek yogurt and hard boiled egg.  Meat and cheese roll up with fruit.  Peanut butter toast.  Scrambled eggs with fruit.  Really any kind of eggs and fruit L: meat and cheese rollup with salad.  Big salad with grilled chicken.  Or sandwich would be ok too D: meat and veggies Snack on cheese, nuts, yogurt, veggies, cottage cheese  Used low-carb snack handout and try arm-chair exercises.  Aim for daily movement  for 20 minutes  Monitoring: 2 weeks

## 2013-11-02 ENCOUNTER — Encounter: Payer: Self-pay | Admitting: Internal Medicine

## 2013-11-02 ENCOUNTER — Ambulatory Visit (INDEPENDENT_AMBULATORY_CARE_PROVIDER_SITE_OTHER): Payer: Medicaid Other | Admitting: Internal Medicine

## 2013-11-02 VITALS — BP 120/74 | HR 132 | Temp 98.0°F | Resp 16 | Wt 363.0 lb

## 2013-11-02 DIAGNOSIS — IMO0001 Reserved for inherently not codable concepts without codable children: Secondary | ICD-10-CM

## 2013-11-02 DIAGNOSIS — E1165 Type 2 diabetes mellitus with hyperglycemia: Principal | ICD-10-CM

## 2013-11-02 LAB — HEMOGLOBIN A1C: HEMOGLOBIN A1C: 7.6 % — AB (ref 4.6–6.5)

## 2013-11-02 MED ORDER — METFORMIN HCL ER 500 MG PO TB24: 2000.0000 mg | ORAL_TABLET | Freq: Every day | ORAL | Status: DC

## 2013-11-02 NOTE — Patient Instructions (Addendum)
-   continue Metformin XR 500 mg in am and 1500 mg at dinner  - continue Januvia 100 mg  - continue Glipizide XL10 mg   Please stop at the lab.  Please come back for a follow-up appointment in 3 months with the sugar log.

## 2013-11-02 NOTE — Progress Notes (Signed)
Patient ID: Alice Simpson, female   DOB: [birth year], 48 y.o.   MRN: [id number]  HPI: Alice Simpson is a 48 y.o.-year-old female, initially referred by her PCP, Dr. Inda Merlin, for management of DM2, non-insulin-dependent, dx ~ 2007, uncontrolled, without complications and PCOS. Last visit 3 mo ago.  Last hemoglobin A1c was:  Lab Results  Component Value Date   HGBA1C 7.7* 06/09/2013  HbA1c 7.1% in 01/25/2013. She had steroid inj in back: last in 02/2013.  Pt is on a regimen of: - Metformin XR 500 mg in am and 1500 mg at dinner - Januvia 100 mg - Glipizide XL10 mg   Pt checks her sugars 2x a day and they are (no log): - am: 130-160 (212 this am: sick, late snack) >> 109-201 (130-170) >> 130-140 lately - 2h after b'fast: n/c >> 1h after: 160-165 - before lunch: n/c >> 129, 171  >> n/c - 2h after lunch: n/c >> 145 >> n/c - before dinner: n/c >> 102-135 (227) >> n/c - 2h after dinner: n/c >> 113, 118, 167, 179 >> n/c - bedtime: n/c >> 136, 151, 152, 174 <<140  - nighttime: 131, 204 No lows. Lowest sugar was 90; she has hypoglycemia awareness at 70.  Highest sugar was 180.Marland Kitchen   She is seen by Alice Simpson (nutrition) and also by Alice Simpson (food therapist). Working on Medical illustrator portions and improving diet. Her high CBGs are usually when she overeats or eats concentrated sweets.   She drinks a diet tea ([place]) >> tries to stop.   - no CKD, last BUN/creatinine: 12/0.63 (08/08/2013). She is on Losartan. - Lipids (08/08/2013): 185/151/69/86 She is on Zocor. - last eye exam was in 09/2012. No DR. Has appt in July.  - + numbness and tingling in her  L 2nd toe.   She was dx with PCOS in 2007. She is on Ortho-micronor (norethindrone) and has withdrawal bleedings every month.  Tried another OCP >> did not feel well on it  ROS: Constitutional: no weight loss/gain, + fatigue, + hot flushes, + poor sleep, + nocturia Eyes: no blurry vision, no xerophthalmia ENT: no sore throat, no  nodules palpated in throat, no dysphagia/odynophagia, no hoarseness Cardiovascular: no CP/SOB/palpitations/+ leg swelling Respiratory: no cough/SOB Gastrointestinal: no N/V/D/C Musculoskeletal: + both: muscle/joint aches Skin: no rashes Neurological: no tremors/numbness/tingling/dizziness  I reviewed pt's medications, allergies, PMH, social hx, family hx and no changes required, except as mentioned above.  PE: BP 120/74  Pulse 132  Temp(Src) 98 F (36.7 C) (Oral)  Resp 16  Wt 363 lb (164.656 kg)  SpO2 98% Wt Readings from Last 3 Encounters:  11/02/13 363 lb (164.656 kg)  10/20/13 365 lb 12.8 oz (165.926 kg)  10/06/13 365 lb (165.563 kg)   Constitutional: obese - gynoid distribution, in NAD Eyes: PERRLA, EOMI, no exophthalmos ENT: moist mucous membranes, no thyromegaly, no cervical lymphadenopathy Cardiovascular: RRR, No MRG, + periankle edema Respiratory: CTA B Gastrointestinal: abdomen soft, NT, ND, BS+ Musculoskeletal: no deformities, strength intact in all 4 Skin: moist, warm, no rashes  ASSESSMENT: 1. DM2, non-insulin-dependent, uncontrolled, without complications  2. PCOS - on Norethindrone - on metformin  PLAN:  1. Patient with long-standing, recently more uncontrolled diabetes, on oral antidiabetic regimen. She is instituting healthy changes in her diet and works with laura Reavis in the Nutrition Dept on this. Last month she has been traveling and did not follow the diet >> sugars higher, but she will start back. - We discussed about options  for treatment, and I suggested to stay on the same regimen for now. She is still learning what she can and cannot eat and how foods influence sugars.   Patient Instructions  - continue Metformin XR 500 mg in am and 1500 mg at dinner  - continue Januvia 100 mg  - continue Glipizide XL10 mg  Please stop at the lab. Please come back for a follow-up appointment in 3 months with the sugar log. - continue checking sugars at  different times of the day - check 2 times a day, rotating checks - advised for yearly eye exams - she needs one >> next mo - check a HbA1c - Return to clinic in 3 mo with sugar log   2. PCOS - tried a different OCP, but returned to [facility] now - continue Metformin 2000 mg daily - continue dietary changes  Office Visit on 11/02/2013  Component Date Value Ref Range Status  . Hemoglobin A1C 11/02/2013 7.6* 4.6 - 6.5 % Final   Glycemic Control Guidelines for People with Diabetes:Non Diabetic:  <6%Goal of Therapy: <7%Additional Action Suggested:  >8%    HbA1c stable.

## 2013-11-03 ENCOUNTER — Ambulatory Visit: Payer: Medicaid Other | Admitting: *Deleted

## 2013-11-17 ENCOUNTER — Encounter: Payer: Medicaid Other | Attending: Family Medicine | Admitting: *Deleted

## 2013-11-17 VITALS — Wt 362.0 lb

## 2013-11-17 DIAGNOSIS — Z713 Dietary counseling and surveillance: Secondary | ICD-10-CM | POA: Insufficient documentation

## 2013-11-17 DIAGNOSIS — E669 Obesity, unspecified: Secondary | ICD-10-CM

## 2013-11-17 DIAGNOSIS — E1165 Type 2 diabetes mellitus with hyperglycemia: Secondary | ICD-10-CM

## 2013-11-17 DIAGNOSIS — IMO0001 Reserved for inherently not codable concepts without codable children: Secondary | ICD-10-CM

## 2013-11-17 DIAGNOSIS — F509 Eating disorder, unspecified: Secondary | ICD-10-CM | POA: Insufficient documentation

## 2013-11-17 DIAGNOSIS — E282 Polycystic ovarian syndrome: Secondary | ICD-10-CM

## 2013-11-17 NOTE — Progress Notes (Signed)
Assessment: smoked 1 cigarette this weekend and now feels sick.   Hasn't been eating regularly since getting back from [place] a month ago.  Her fasting blood glucose readings vary from 145-224 mg/dl.  Her diet is still low in fruits and veggies and higher than I would like in processed carbohydrates.  She's struggling with her love life, social life, finances, etc. And still hasn't quite made herself a priority.  typical dietary recall Yogurt or juice and bar Ham sandwich or rolled in cheese without  cheese-filled pretzel balls  Nutrition Diagnosis: Excessive energy intake as related to history of binging as evidenced by BMI>30   Nutrition intervention: Aim for 3 meals each day and avoid meal skipping Limit carbs to 30 g per meal and snack.  Always have protein with meals and snacks Increase non-starchy vegetables  B: eggs or [place] sausage or [place] bacon or greek yogurt or peanut butter or nuts or cheese or sandwich meat     With fruit or 2 slices toast or 1/2 belvita packet or english muffin or  L: sandwich piled with veggies and fruit.  Or meat and cheese roll ups with fruit and side salad. Or soup with cheese stick   D: meat, veggies, small starch  Used snack handout  Each protein and veggies first, then eat your carbs so your sugar doesn't spike  Look into PCOS meetup group for socialization  Monitoring: will follow up in 2 weeks

## 2013-11-17 NOTE — Progress Notes (Signed)
[facility]AN[facility]  BODY COMP RESULTS  11/17/13   BMI (kg/m^2) 55   Fat Mass (lbs) 206.5   Fat Free Mass (lbs) 155.5   Total Body Water (lbs) 114  BMR (kcal) 2300

## 2013-11-17 NOTE — Patient Instructions (Signed)
Aim for 3 meals each day and avoid meal skipping Limit carbs to 30 g per meal and snack.  Always have protein with meals and snacks Increase non-starchy vegetables  B: eggs or [place] sausage or [place] bacon or greek yogurt or peanut butter or nuts or cheese or sandwich meat     With fruit or 2 slices toast or 1/2 belvita packet or english muffin or  L: sandwich piled with veggies and fruit.  Or meat and cheese roll ups with fruit and side salad. Or soup with cheese stick   D: meat, veggies, small starch  Used snack handout  Each protein and veggies first, then eat your carbs so your sugar doesn't spike  Look into PCOS meetup group for socialization

## 2013-12-01 ENCOUNTER — Ambulatory Visit: Payer: Medicaid Other | Admitting: *Deleted

## 2013-12-01 ENCOUNTER — Encounter: Payer: Medicaid Other | Admitting: *Deleted

## 2013-12-01 VITALS — Wt 371.0 lb

## 2013-12-01 DIAGNOSIS — F509 Eating disorder, unspecified: Secondary | ICD-10-CM

## 2013-12-01 DIAGNOSIS — E1165 Type 2 diabetes mellitus with hyperglycemia: Secondary | ICD-10-CM

## 2013-12-01 DIAGNOSIS — E282 Polycystic ovarian syndrome: Secondary | ICD-10-CM | POA: Diagnosis not present

## 2013-12-01 DIAGNOSIS — IMO0001 Reserved for inherently not codable concepts without codable children: Secondary | ICD-10-CM

## 2013-12-01 DIAGNOSIS — E669 Obesity, unspecified: Secondary | ICD-10-CM

## 2013-12-01 NOTE — Progress Notes (Signed)
Assessment:  Alice Simpson is here for follow up nutrition counseling pertaining to her Binge Eating Disorder, a well as diabetes and PCOS. She has been trying to keep a log of her glucose readings.  Her fasting glucose values range from 148 mg/dl to 261 mg mg/dl.  Her normal fasting is between 150-170.  She has 2 readings above 200 mg/dl this past 2 weeks and she wasn't sure why.  One night she did eat more carbohydrates late and she attributes that to her high fasting glucose the next day. One night she wasn't sure what happened:  She ate chicken and then immediately fell asleep for 9 hours.  I hypothesized that her blood sugars actually dropped during the night and due to the Dawn Phenomenon, glucagon caused an increase in glucose release from her liver.  I instructed her to keep regular records of her blood sugars so we can tell if this is a pattern or a random outlier.    She feels like she been eating more carbohydrates this week and she has gained some weight as a result.  She also admits to binging 2-3 times. She noticed that when she stuffs herself with food she feels numb, she isn't sexually or emotionally vulnerable.  She is under a considerable amount of stress.  Her son, [name], will most likely be discharged from the army.  He has been diagnosed with ADHD and mood disorder for several years, but he didn't tell the army.  If he is discharged, he will most likely move back home with Bettyjean and that would not be ideal for either mother or son.  Her "foster " daughter, Niger gets her liscense soon and her biological daughter, Heath Lark for school in a month and Jovita was really looking forward to being without kids.  She doesn't want to have jesse back.   Mayetta is also having some housing struggles and she is uncertain of where she might have to live in the future.  She also wants to finish her college degree.  She has 8 credit hours left to finish.  She has a lot going on financially and college is not  really in her budget.  She is also concerned because she can't physically fit in the desks at [employer], nor can she physically walk form the parking lot to the classroom.    On top of all this she Is still processing her relationship with Luvenia Heller.  She likes having him in her life and she likes having him in her corner.  She is pushing aside relationships with other men, using Luvenia Heller as her security blanket.   Dietary recall: Cottage cheese, cashews, pie, milk, waffle cone, cherry limeade, 1/2 and 1/2 tea from [employer], protein bars, hot dogs, pretzels, cereal    Nutrition Diagnosis: NB-1.5 Disordered eating pattern As related to binge eating disorder. As evidenced by dietary recall.   Nutrition Intervention:  Discussed MyPlate recommendations for meal planning: increasing non-starchy vegetables.  Ronnica tries to limit carbohydrates and consume adequate protein, but her fruit and vegetable consumption are very low.  Asked her to focus on increasing her vegetables each day.  Also discussed importance of physical activity.  She is limited due to back pain.   Water exercises would be ideal, but she has limited access to a pool.  Suggested chair exercises at the bare minimum.  Handouts given: Armchair exercises MyPlate for Diabetes  Monitoring/Evaluation:  Patient will follow up in 2 weeks

## 2013-12-01 NOTE — Patient Instructions (Signed)
Goals:  Eat 3 meals/day, Avoid meal skipping   Increase protein rich foods  Follow "Plate Method" for portion control  Limit carbohydrate1-2 servings/meal   Choose more whole grains, lean protein, low-fat dairy, and fruits/non-starchy vegetables.   Aim for >10 min of physical activity daily  Limit sugar-sweetened beverages and concentrated sweets

## 2013-12-07 ENCOUNTER — Other Ambulatory Visit: Payer: Self-pay | Admitting: Obstetrics and Gynecology

## 2013-12-07 ENCOUNTER — Other Ambulatory Visit (HOSPITAL_COMMUNITY)
Admission: RE | Admit: 2013-12-07 | Discharge: 2013-12-07 | Disposition: A | Discharge status: Home / Self Care | Payer: Medicaid Other | Source: Ambulatory Visit | Attending: Obstetrics and Gynecology | Admitting: Obstetrics and Gynecology

## 2013-12-07 DIAGNOSIS — Z01419 Encounter for gynecological examination (general) (routine) without abnormal findings: Secondary | ICD-10-CM | POA: Diagnosis present

## 2013-12-11 LAB — CYTOLOGY - PAP

## 2013-12-12 ENCOUNTER — Encounter: Payer: Medicaid Other | Admitting: *Deleted

## 2013-12-12 DIAGNOSIS — E1165 Type 2 diabetes mellitus with hyperglycemia: Secondary | ICD-10-CM

## 2013-12-12 DIAGNOSIS — E282 Polycystic ovarian syndrome: Secondary | ICD-10-CM | POA: Diagnosis not present

## 2013-12-12 DIAGNOSIS — IMO0001 Reserved for inherently not codable concepts without codable children: Secondary | ICD-10-CM

## 2013-12-12 DIAGNOSIS — F509 Eating disorder, unspecified: Secondary | ICD-10-CM

## 2013-12-12 DIAGNOSIS — E785 Hyperlipidemia, unspecified: Secondary | ICD-10-CM

## 2013-12-12 DIAGNOSIS — E669 Obesity, unspecified: Secondary | ICD-10-CM

## 2013-12-12 NOTE — Progress Notes (Signed)
Assessment:  Alice Simpson is here for follow up nutrition counseling pertaining to Binge Eating Disorder, morbid obesity, type 2 diabetes, and hyperlipidemia.  Alice Simpson reports binging more often.  (she thinks 3-4 days).  She calls these episodes binges, but really she just ate too much.  She ate a foot-long sub.  She was hungry for half of it, but the rest would have gotten soggy in the fridge so she ate it all.  Her eating is disordered and erratic, but she has made considerable progress with reducing the number of binges.  Since January of this year she has lost almost 15 pounds.  She's disappointed in her progress, but I am quite pleased.  She is not able to exercise.  She has considerable back, hip, knee, and leg pain.  She also has diabetic neuropathy.  We have discussed the need for physical activity, but she feels she just can not.  I have given her information on armchair exercises.      She is under a considerable amount of stress.  Her son, Alice Simpson, will most likely be discharged from the army.  He has been diagnosed with ADHD and mood disorder for several years, but he didn't tell the army.  If he is discharged, he will most likely move back home with Alice Simpson and that would not be ideal for either mother or son.  Her "foster " daughter, Alice Simpson gets her liscense soon and her biological daughter, Alice Simpson for school in a month and Alice Simpson was really looking forward to being without kids.  She doesn't want to have Alice Simpson back.   Alice Simpson is also having some housing struggles and she is uncertain of where she might have to live in the future.  She also wants to finish her college degree.  She has 8 credit hours left to finish.  She has a lot going on financially and college is not really in her budget.  She is also concerned because she can't physically fit in the desks at [employer], nor can she physically walk from the parking lot to the classroom.    On top of all this she Is still processing her relationship with  Alice Simpson.  She likes having him in her life and she likes having him in her corner.  She is pushing aside relationships with other men, using Alice Simpson as her security blanket.   Dietary recall: Cottage cheese, cashews, pie, milk, waffle cone, cherry limeade, 1/2 and 1/2 tea from [employer], protein bars, hot dogs, pretzels, cereal   Nutrition Diagnosis: NB-1.5 Disordered eating pattern As related to binge eating disorder. As evidenced by dietary recall.   Nutrition Intervention:  We talked a great deal about the possibility of bariatric surgery.   Alice Simpson is concerned her insurance won't cover surgery.  I told her I would speak with Alice Simpson, manager, about insurance coverage.  I talked with Alice Simpson about the implications of surgery and what it could and could not do to improve her health and quality of life.  The underlying issue is a dependance on food to self-medicate/numb her feelings.  That won't go away with surgery so we still need to be in treatment for her BED. However, the surgery could greatly improve her physical health.  We will continue to discuss this in the future.  In the mean time, there are dietary choices she could easily fix like limiting her sugary beverages: tea, Sonic Slushie, etc.  She agreed to limit her sugary beverages   Monitoring/Evaluation:  Patient will follow up in 2 weeks

## 2013-12-22 ENCOUNTER — Telehealth (INDEPENDENT_AMBULATORY_CARE_PROVIDER_SITE_OTHER): Payer: Self-pay

## 2013-12-22 NOTE — Telephone Encounter (Signed)
Called and spoke to patient with Consultation appointment scheduled for 01/24/14 @ 1:30 pm w/Dr. Hassell Done.  Patient attending seminar 12/27/13.  Appointment scheduled per Dr. Carlye Grippe request.

## 2013-12-29 ENCOUNTER — Encounter: Payer: Medicaid Other | Attending: Internal Medicine | Admitting: *Deleted

## 2013-12-29 DIAGNOSIS — E282 Polycystic ovarian syndrome: Secondary | ICD-10-CM | POA: Diagnosis not present

## 2013-12-29 DIAGNOSIS — Z713 Dietary counseling and surveillance: Secondary | ICD-10-CM | POA: Diagnosis not present

## 2013-12-29 DIAGNOSIS — E119 Type 2 diabetes mellitus without complications: Secondary | ICD-10-CM | POA: Insufficient documentation

## 2013-12-29 DIAGNOSIS — IMO0001 Reserved for inherently not codable concepts without codable children: Secondary | ICD-10-CM

## 2013-12-29 DIAGNOSIS — E1165 Type 2 diabetes mellitus with hyperglycemia: Secondary | ICD-10-CM

## 2013-12-29 DIAGNOSIS — F509 Eating disorder, unspecified: Secondary | ICD-10-CM | POA: Diagnosis not present

## 2013-12-29 DIAGNOSIS — E785 Hyperlipidemia, unspecified: Secondary | ICD-10-CM | POA: Insufficient documentation

## 2013-12-29 DIAGNOSIS — E669 Obesity, unspecified: Secondary | ICD-10-CM | POA: Insufficient documentation

## 2013-12-29 NOTE — Patient Instructions (Signed)
Goals:  Eat 3 meals/day, Avoid meal skipping   Increase protein rich foods  Follow "Plate Method" for portion control  Limit carbohydrate1-2 servings/meal   Choose more whole grains, lean protein, low-fat dairy, and fruits/non-starchy vegetables.   Aim for >30 min of physical activity daily  Limit sugar-sweetened beverages and concentrated sweets   

## 2013-12-29 NOTE — Progress Notes (Signed)
Assessment:  Patient was seen on 12/29/13 for follow up nutrition counseling pertaining to her disordered eating, uncontrolled diabetes, and desire to lose weight.  Saw orthopedist and found out that she needs a total hip replacement.  She doesn't think they will do the surgery unless she loses weight She has been to the bariatric surgery seminar to move forward on that process.  She has an appointment with Dr. Kaylyn Lim on 01/25/14 for an initial consultation.  Her PCP, Dr. Inda Merlin, is in favor of bariatric surgery.  She realizes that if she has the surgery she will have "to break up with food,"  She wants to attend one of the bariatric surgery support groups to see how people are dealing with the changes in their lives  She is transitioning to Alice Simpson at [employer] for Psychotherapy as Alice Simpson is changing careers   Dietary assessment:  She feels like she has been eating better this past week and is disappointed that she has actually gained weight per the scale.   Cut back on her sugary beverages: less tea, less coke slushies,  Has been drinking more water Had potatoes and ice cream last night, but is trying to cut back on starches in general Has had boneless, skinless chicken with peas; ribs with broccoli She says she has a starch with dinner maybe once a week.  Might might have bread if she's eating a sandwich, but she isn't cooking starches much anymore.  However she realizes that she does have starches with breakfast and occasionally has root beer float so she hasn't cut back as much as she thought  Switched to lactaid milk with added calcium  Recall: B: skipped L: 6 tacos from [employer] D: chicken with peas, potatoes S: strawberry shortcake  Nutrition Diagnosis: NB-1.5 Disordered eating pattern As related to binge eating disorder. As evidenced by dietary recall.  Intervention:  Nutrition counseling provided.  Pointed out "hidden" carbs in her diet and reiterated importance of  regularly scheduled meals to avoid over-consumption later on.  Also stressed importance of daily movement, whatever she can do  Try glucosamine for your knees  Eat 3 meals/day, Avoid meal skipping   Increase protein rich foods- each meal and snack  Limit carbohydrate1-2 servings/meal   Choose more whole grains, lean protein, low-fat dairy, and fruits/non-starchy vegetables.   Aim for physical activity daily- whatever you can do, chair exercises, pushing shopping cart, etc  Limit sugar-sweetened beverages and concentrated sweets   Monitoring:  Patient will follow up in 2 weeks.

## 2014-01-11 ENCOUNTER — Other Ambulatory Visit: Payer: Self-pay

## 2014-01-11 DIAGNOSIS — Z1231 Encounter for screening mammogram for malignant neoplasm of breast: Secondary | ICD-10-CM

## 2014-01-12 ENCOUNTER — Ambulatory Visit: Payer: Medicaid Other | Admitting: *Deleted

## 2014-01-19 ENCOUNTER — Ambulatory Visit: Payer: Medicaid Other | Admitting: *Deleted

## 2014-01-24 ENCOUNTER — Ambulatory Visit (INDEPENDENT_AMBULATORY_CARE_PROVIDER_SITE_OTHER): Payer: Self-pay | Admitting: Surgery

## 2014-01-25 ENCOUNTER — Ambulatory Visit
Admission: RE | Admit: 2014-01-25 | Discharge: 2014-01-25 | Disposition: A | Discharge status: Home / Self Care | Payer: Medicaid Other | Source: Ambulatory Visit

## 2014-01-25 ENCOUNTER — Ambulatory Visit (INDEPENDENT_AMBULATORY_CARE_PROVIDER_SITE_OTHER): Payer: Self-pay | Admitting: Surgery

## 2014-01-25 ENCOUNTER — Other Ambulatory Visit (INDEPENDENT_AMBULATORY_CARE_PROVIDER_SITE_OTHER): Payer: Self-pay

## 2014-01-25 DIAGNOSIS — E785 Hyperlipidemia, unspecified: Secondary | ICD-10-CM

## 2014-01-25 DIAGNOSIS — E119 Type 2 diabetes mellitus without complications: Secondary | ICD-10-CM

## 2014-01-25 DIAGNOSIS — I10 Essential (primary) hypertension: Secondary | ICD-10-CM

## 2014-01-25 DIAGNOSIS — Z1231 Encounter for screening mammogram for malignant neoplasm of breast: Secondary | ICD-10-CM

## 2014-02-01 ENCOUNTER — Encounter: Payer: Medicaid Other | Attending: Internal Medicine | Admitting: *Deleted

## 2014-02-01 VITALS — Wt 365.0 lb

## 2014-02-01 DIAGNOSIS — Z713 Dietary counseling and surveillance: Secondary | ICD-10-CM | POA: Diagnosis not present

## 2014-02-01 DIAGNOSIS — E282 Polycystic ovarian syndrome: Secondary | ICD-10-CM | POA: Insufficient documentation

## 2014-02-01 DIAGNOSIS — E785 Hyperlipidemia, unspecified: Secondary | ICD-10-CM | POA: Insufficient documentation

## 2014-02-01 DIAGNOSIS — E119 Type 2 diabetes mellitus without complications: Secondary | ICD-10-CM | POA: Insufficient documentation

## 2014-02-01 DIAGNOSIS — F509 Eating disorder, unspecified: Secondary | ICD-10-CM | POA: Diagnosis not present

## 2014-02-01 DIAGNOSIS — E669 Obesity, unspecified: Secondary | ICD-10-CM | POA: Diagnosis not present

## 2014-02-01 NOTE — Progress Notes (Signed)
Appointment start time: 1400  Appointment end time: 1500   Assessment: Alice Simpson is here for follow up nutrition counseling pertaining to her binge eating disorder and subsequent health problems: obesity and diabetes.  She had her consultation with Dr. Hassell Done for bariatric surgery last week and she has an appointment set up with [name], RD for bariatric nutrition assessment.  She has asked her current therapist, [name], to write her psych evaluation, but has not heard back yet.  She would like to continue working with this RD for management of her BED, in addition to working with the bariatric RD team as she prepares for surgery.    She brought a log with her today of her dietary recall and blood glucose readings for the past week.  Her fasting glucose values are all elevated.  Her post prandial readings are WNL for the most part.  This is in spite of continued poor dietary habits.  She has an appointment with Dr. [name], endocrinology, in a few week.  It is possible that medication adjustments could be made.  Her glucose readings were in the 300s and 400s a few weeks ago when she was taking steroid injections for the pain in her hip.  Her glucose ranges are not that high anymore and stay in the mid to high 100s  Her diet log reveals erratic meals and snacks.  We have been trying to implement 3 structured meals/day, but that hasn't happened.  She states she is not hungry and forget to eat until late in the day.  She tries to choose protein-rich foods, but when she is out and about running errands and all of a sudden gets hungry, she stops at the convenience store and gets sugary beverages and processed foods.  Her financial situation is worsening and her food stamp benefits have decreased.  Her Medicaid is set to run out next year and she isn't sure what she's going to do.  She is thinking about applying for jobs.   TANITA  BODY COMP RESULTS  02/01/14   BMI (kg/m^2) 55.7   Fat Mass (lbs) 208   Fat Free Mass (lbs) 158.5   Total Body Water (lbs) 116    24 hr Dietary recall: 12:30 pm- chicken tortilla, milk 2:30 pm- coke 10:30 pm- chicken tortilla, [place]  Nutrition diagnosis: Disordered eating pattern as related to meal skipping and history of binge eating.  As evidenced by binge eating disorder and dietary recall  Intervention: Discussed cost-effective shopping tips like buying frozen vegetables instead of fresh, cheap proteins like peanut butter, eggs, and tuna.  Recommended planning meals and snacks ahead of time so she wont' have to go to convenience stores.  Strongly recommended not buying the sugary beverages throughout the day to save money and carbs.  Suggested cooking at home as much as possible and not buying convenience items out.  Reinforced importance of eating 3 meals/day and to avoid meal skipping.  Stressed protein   Monitoring:  Patient will follow up with this RD in 1 week

## 2014-02-02 ENCOUNTER — Ambulatory Visit: Payer: Medicaid Other | Admitting: Internal Medicine

## 2014-02-09 ENCOUNTER — Encounter: Payer: Medicaid Other | Admitting: *Deleted

## 2014-02-09 VITALS — Wt 363.0 lb

## 2014-02-09 DIAGNOSIS — E282 Polycystic ovarian syndrome: Secondary | ICD-10-CM

## 2014-02-09 DIAGNOSIS — F509 Eating disorder, unspecified: Secondary | ICD-10-CM

## 2014-02-09 DIAGNOSIS — E1165 Type 2 diabetes mellitus with hyperglycemia: Secondary | ICD-10-CM

## 2014-02-09 DIAGNOSIS — E119 Type 2 diabetes mellitus without complications: Secondary | ICD-10-CM | POA: Diagnosis not present

## 2014-02-09 DIAGNOSIS — E785 Hyperlipidemia, unspecified: Secondary | ICD-10-CM

## 2014-02-09 DIAGNOSIS — IMO0001 Reserved for inherently not codable concepts without codable children: Secondary | ICD-10-CM

## 2014-02-09 NOTE — Progress Notes (Signed)
Appointment start time: 0900  Appointment end time: 1000   Assessment: Alice Simpson is here for follow up nutrition counseling pertaining to her binge eating disorder and subsequent health problems: obesity and diabetes.  She continues her preparation for bariatric surgery: she has an appointment set up with [name], RD for bariatric nutrition assessment.  She has asked her current therapist, [name], to write her psych evaluation, but [name] doesn't feel comfortable with that.  [name] is asking who else can write one for her.  She would like to continue working with this RD for management of her BED, in addition to working with the bariatric RD team as she prepares for surgery.    She brought a log with her today of her dietary recall and blood glucose readings for the past week.  Her fasting glucose values are mostly elevated.  Her post prandial readings are WNL for the most part.  This is in part of continued poor dietary habits: she continues to follow a disordered meal patterna nd skip or delays meals, then eats late into the ngiht.  SHe is trying to have more protein, but she does eat a lot of carbohydrates/sweets and she eats them late.  Her sleep is poor.  She doesn't like to take prescription sleep aids and Zquil leaves her feeling groggy.   She has an appointment with Dr. [name], endocrinology, in a few weeks.  It is possible that medication adjustments could be made.  Her glucose readings were in the 300s and 400s a few weeks ago when she was taking steroid injections for the pain in her hip.  Her glucose ranges are not that high anymore and stay in the mid to high 100s  Her diet log reveals erratic meals and snacks.  We have been trying to implement 3 structured meals/day, but that hasn't happened.  She states she is not hungry and forget to eat until late in the day.  She tries to choose protein-rich foods, but when she is out and about running errands and all of a sudden gets hungry, she  stops at the convenience store and gets sugary beverages and processed foods.  Her financial situation is worsening and her food stamp benefits have decreased.  Her Medicaid is set to run out next year and she isn't sure what she's going to do.  She is thinking about applying for jobs.   TANITA  BODY COMP RESULTS  02/01/14   BMI (kg/m^2) 55.7   Fat Mass (lbs) 208   Fat Free Mass (lbs) 158.5   Total Body Water (lbs) 116    24 hr Dietary recall: 12:30 pm: salad, corn kernels, water 8:30 pm: small cheesesteak, stromboli, sugary-free koolaid  Nutrition diagnosis: Disordered eating pattern as related to meal skipping and history of binge eating.  As evidenced by binge eating disorder and dietary recall  Intervention: Reinforced importance of eating 3 meals/day and to avoid meal skipping: suggested setting alarm on her phone to go off and remind her to eat every 4 hours.  She has downloaded some apps to help her with logging her food and glucose.   Referred to [name] for her psych evaluation for her surgery.   Encouraged movement wherever possible: stretching, twisting, arm raises, etc.    Monitoring:  Patient will follow up with [name], RD in 2 weeks and will follow up with this RD prn

## 2014-02-13 ENCOUNTER — Ambulatory Visit (HOSPITAL_COMMUNITY)
Admission: RE | Admit: 2014-02-13 | Discharge: 2014-02-13 | Disposition: A | Discharge status: Home / Self Care | Payer: Medicaid Other | Source: Ambulatory Visit | Attending: Surgery | Admitting: Surgery

## 2014-02-13 DIAGNOSIS — I10 Essential (primary) hypertension: Secondary | ICD-10-CM

## 2014-02-13 DIAGNOSIS — Z87891 Personal history of nicotine dependence: Secondary | ICD-10-CM | POA: Diagnosis not present

## 2014-02-13 DIAGNOSIS — K769 Liver disease, unspecified: Secondary | ICD-10-CM | POA: Diagnosis not present

## 2014-02-13 DIAGNOSIS — Z6841 Body Mass Index (BMI) 40.0 and over, adult: Secondary | ICD-10-CM | POA: Diagnosis not present

## 2014-02-13 DIAGNOSIS — E785 Hyperlipidemia, unspecified: Secondary | ICD-10-CM

## 2014-02-13 DIAGNOSIS — K7689 Other specified diseases of liver: Secondary | ICD-10-CM | POA: Insufficient documentation

## 2014-02-13 DIAGNOSIS — E119 Type 2 diabetes mellitus without complications: Secondary | ICD-10-CM | POA: Diagnosis not present

## 2014-02-13 DIAGNOSIS — E282 Polycystic ovarian syndrome: Secondary | ICD-10-CM | POA: Diagnosis not present

## 2014-02-16 ENCOUNTER — Ambulatory Visit: Payer: Medicaid Other | Admitting: Internal Medicine

## 2014-02-20 ENCOUNTER — Encounter: Payer: Medicaid Other | Attending: Internal Medicine | Admitting: Dietician

## 2014-02-20 ENCOUNTER — Encounter: Payer: Self-pay | Admitting: Dietician

## 2014-02-20 DIAGNOSIS — Z713 Dietary counseling and surveillance: Secondary | ICD-10-CM | POA: Diagnosis not present

## 2014-02-20 DIAGNOSIS — Z6841 Body Mass Index (BMI) 40.0 and over, adult: Secondary | ICD-10-CM | POA: Diagnosis not present

## 2014-02-20 NOTE — Patient Instructions (Signed)
Follow Pre-Op Goals Try Protein Shakes Call [facility] at [phone] when surgery is scheduled to enroll in Pre-Op Class 

## 2014-02-20 NOTE — Progress Notes (Signed)
  Pre-Op Assessment Visit:  Pre-Operative Sleeve or RYGB Surgery  Medical Nutrition Therapy:  Appt start time: 1400   End time:  1440.  Patient was seen on 02/20/2014 for Pre-Operative RYGB or Sleeve Gastrectomy Nutrition Assessment. Assessment and letter of approval faxed to [facility] Bariatric Surgery Program coordinator on 02/20/2014. Harvey is also a patient of Ozzie Hoyle RD, who is providing nutrition counseling for binge eating disorder.  Preferred Learning Style:   No preference indicated   Learning Readiness:   Ready  Handouts given during visit include:  Pre-Op Goals Bariatric Surgery Protein Shakes  Teaching Method Utilized:  Visual Auditory Hands on  Barriers to learning/adherence to lifestyle change: limited financial resources  Demonstrated degree of understanding via:  Teach Back   Patient to call [facility] to enroll in Pre-Op and Post-Op Nutrition Education when surgery date is scheduled.

## 2014-02-22 ENCOUNTER — Ambulatory Visit (INDEPENDENT_AMBULATORY_CARE_PROVIDER_SITE_OTHER): Payer: Medicaid Other | Admitting: Internal Medicine

## 2014-02-22 ENCOUNTER — Encounter: Payer: Self-pay | Admitting: Internal Medicine

## 2014-02-22 VITALS — BP 122/68 | HR 116 | Temp 98.8°F | Resp 12 | Wt 364.0 lb

## 2014-02-22 DIAGNOSIS — E119 Type 2 diabetes mellitus without complications: Secondary | ICD-10-CM

## 2014-02-22 LAB — HEMOGLOBIN A1C: Hgb A1c MFr Bld: 7.5 % — ABNORMAL HIGH (ref 4.6–6.5)

## 2014-02-22 NOTE — Patient Instructions (Signed)
Please continue: - Metformin XR 500 mg in am and 1500 mg at dinner  - Januvia 100 mg but move this to before dinner - Glipizide XL10 mg in am  Please return in 3 months with your sugar log.   Please stop at the lab.

## 2014-02-22 NOTE — Progress Notes (Signed)
Patient ID: Alice Simpson, female   DOB: [birth year], 48 y.o.   MRN: [id number]  HPI: Alice Simpson is a 48 y.o.-year-old female, initially referred by her PCP, Dr. Inda Merlin, for management of DM2, non-insulin-dependent, dx ~ 2007, uncontrolled, without complications. Last visit 3 mo ago.  She got a cortisone inj. In hip at beginning of 01/2014. She will have a hip replacement. This will happen after she has the GBP - RenY or sleeve gastrectomy (Dr. Hassell Done). The Sx may be done in 05/2014 or before (she is on the waiting list).  Last hemoglobin A1c was:  Lab Results  Component Value Date   HGBA1C 7.6* 11/02/2013   HGBA1C 7.7* 06/09/2013  HbA1c 7.1% in 01/25/2013. She had steroid inj in back: last in 02/2013.  Pt is on a regimen of: - Metformin XR 500 mg in am and 1500 mg at dinner - Januvia 100 mg - Glipizide XL10 mg   Pt checks her sugars 2x a day and they are (no log) higher in am as she overeats at night: - am: 130-160 (212 this am: sick, late snack) >> 109-201 (130-170) >> 130-140 lately >> 144-176, 194 (snacks at night) - 2h after b'fast: n/c >> 1h after: 160-165 >> n/c  - before lunch: n/c >> 129, 171 >> n/c >> 156 - 2h after lunch: n/c >> 145 >> n/c >> 82, 239 after donuts - before dinner: n/c >> 102-135 (227) >> n/c >> 87 - 2h after dinner: n/c >> 113, 118, 167, 179 >> n/c - bedtime: n/c >> 136, 151, 152, 174 <<140  >> 122, 183 - nighttime: 131, 204 >> 141 No lows. Lowest sugar was 82; she has hypoglycemia awareness at 70.  Highest sugar was 194  She is seen by Ozzie Hoyle (nutrition) and also by Cassandria Santee (food therapist). Working on Medical illustrator portions and improving diet. Her high CBGs are usually when she overeats or eats concentrated sweets at night.   - no CKD, last BUN/creatinine: 12/0.63 (08/08/2013). She is on Losartan. - Lipids (08/08/2013): 185/151/69/86 She is on Zocor. - last eye exam was in 12/2013 >> No DR (Dr Prudencio Burly) - + numbness and tingling in her  L  2nd toe.   She was dx with PCOS in 2007. She is on Ortho-micronor (norethindrone) and has withdrawal bleedings every month.  Tried another OCP >> did not feel well on it  ROS: Constitutional: no weight loss/gain, + fatigue, + hot flushes, + poor sleep, + nocturia Eyes: no blurry vision, no xerophthalmia ENT: no sore throat, no nodules palpated in throat, no dysphagia/odynophagia, no hoarseness Cardiovascular: no CP/SOB/palpitations/+ leg swelling Respiratory: no cough/SOB Gastrointestinal: no N/V/D/C Musculoskeletal: + both: muscle/joint aches Skin: no rashes Neurological: no tremors/numbness/tingling/dizziness  I reviewed pt's medications, allergies, PMH, social hx, family hx and no changes required, except as mentioned above.  PE: BP 122/68  Pulse 116  Temp(Src) 98.8 F (37.1 C) (Oral)  Resp 12  Wt 364 lb (165.109 kg)  SpO2 95%  LMP 02/06/2014 Wt Readings from Last 3 Encounters:  02/22/14 364 lb (165.109 kg)  02/20/14 363 lb 8 oz (164.883 kg)  02/09/14 363 lb (164.656 kg)   Constitutional: obese - gynoid distribution, in NAD Eyes: PERRLA, EOMI, no exophthalmos ENT: moist mucous membranes, no thyromegaly, no cervical lymphadenopathy Cardiovascular: RRR, No MRG, + periankle edema Respiratory: CTA B Gastrointestinal: abdomen soft, NT, ND, BS+ Musculoskeletal: no deformities, strength intact in all 4 Skin: moist, warm, no rashes  ASSESSMENT: 1. DM2, non-insulin-dependent, uncontrolled,  without complications  2. PCOS - on Norethindrone - on metformin  PLAN:  1. Patient with long-standing, uncontrolled diabetes, on oral antidiabetic regimen. She is instituting healthier changes in her diet and works with Ozzie Hoyle in the Nutrition Dept on this. She still overeats at night >> sugars high in am. She will have GBP in next 3 months! Patient Instructions  Please continue: - Metformin XR 500 mg in am and 1500 mg at dinner  - Januvia 100 mg but move this to before  dinner - Glipizide XL10 mg in am  Please return in 3 months with your sugar log.   Please stop at the lab.  - continue checking sugars at different times of the day - check 2 times a day, rotating checks - advised for yearly eye exams - she is UTD - check a HbA1c today - refuses flu vaccine - Return to clinic in 3 mo with sugar log   2. Obesity - we discussed about gastric Bypass types and the fact that her DM will improve afterwards - we will likely need to stop Glipizide (and maybe Januvia too) right after the Sx  Office Visit on 02/22/2014  Component Date Value Ref Range Status  . Hemoglobin A1C 02/22/2014 7.5* 4.6 - 6.5 % Final   Glycemic Control Guidelines for People with Diabetes:Non Diabetic:  <6%Goal of Therapy: <7%Additional Action Suggested:  >8%    : HbA1c a little better.

## 2014-03-01 ENCOUNTER — Encounter: Payer: Medicaid Other | Admitting: *Deleted

## 2014-03-01 VITALS — Wt 365.8 lb

## 2014-03-01 DIAGNOSIS — F5081 Binge eating disorder: Secondary | ICD-10-CM

## 2014-03-01 DIAGNOSIS — IMO0002 Reserved for concepts with insufficient information to code with codable children: Secondary | ICD-10-CM

## 2014-03-01 DIAGNOSIS — E1165 Type 2 diabetes mellitus with hyperglycemia: Secondary | ICD-10-CM

## 2014-03-01 DIAGNOSIS — E118 Type 2 diabetes mellitus with unspecified complications: Principal | ICD-10-CM

## 2014-03-01 NOTE — Progress Notes (Signed)
Appointment start time: 1400  Appointment end time: 1500   Assessment: Alice Simpson is here for follow up nutrition counseling pertaining to her binge eating disorder and subsequent health problems: obesity and diabetes.  She continues her preparation for bariatric surgery: she had an appointment with [name], RD for bariatric nutrition assessment.   She has been instructed to cut out caffeine, which she has done; she is to discontinue fluids with meals and chew her food 30 times.  She went on a trip to see family.  On the trip she took zero calorie sport beverages, nuts, protein bars.  While there, they went out for dinner.  She stopped when full and drank sugar-free beverages.  The had a family reunion day and there was a lot of food, but she didn't overdo it too badly and ate smaller portions.  She ate a lot of types of food, but ate smaller portions that she normally would have.  She went out and got soup and salad.  All in all she did pretty well for a potentially extravagant eating expreience  Her son has moved back home and that is a huge source of stress, mentally and financially.   HPI: Her diet log reveals erratic meals and snacks.  We have been trying to implement 3 structured meals/day, but that hasn't happened.  She states she is not hungry and forget to eat until late in the day.  She tries to choose protein-rich foods, but when she is out and about running errands and all of a sudden gets hungry, she stops at the convenience store and gets sugary beverages and processed foods.  Her financial situation is worsening and her food stamp benefits have decreased.  Her Medicaid is set to run out next year and she isn't sure what she's going to do.  She is thinking about applying for jobs.   TANITA  BODY COMP RESULTS  02/01/14   BMI (kg/m^2) 55.7   Fat Mass (lbs) 208   Fat Free Mass (lbs) 158.5   Total Body Water (lbs) 116    24 hr Dietary recall: 12:30 pm: salad, corn kernels, water 8:30  pm: small cheesesteak, stromboli, sugary-free koolaid  Nutrition diagnosis: Disordered eating pattern as related to meal skipping and history of binge eating.  As evidenced by binge eating disorder and dietary recall  Intervention: Reinforced importance of eating 3 meals/day and to avoid meal skipping: suggested setting alarm on her phone to go off and remind her to eat every 3 hours.  She has downloaded some apps to help her with logging her food and glucose.    Goals: Each time you eat have 2-3 oz protein and up to 1 starch (if you eat 2 starches, skip one later) 2 times a day have 1-2 vegetable servings or more 1-2 times a day have 1 fruit serving 1 fat/eating occasion (cheese and nuts/peanut butter count as protein and fat) 1-2 times a day have 1 dairy (greek yogurt counts as protein)  Monitoring:  Patient will follow up with bariatric team prn for surgery and will follow up with this RD in 2 weeks for disordered eating

## 2014-03-01 NOTE — Patient Instructions (Signed)
Whatever time you get up, eat.  Then set alarms to go off every 3 hours thereafter  Each time you eat have 2-3 oz protein and up to 1 starch (if you eat 2 starches, skip one later) 2 times a day have 1-2 vegetable servings or more 1-2 times a day have 1 fruit serving 1 fat/eating occasion (cheese and nuts/peanut butter count as protein and fat) 1-2 times a day have 1 dairy (greek yogurt counts as protein)

## 2014-03-15 ENCOUNTER — Encounter: Payer: Medicaid Other | Admitting: *Deleted

## 2014-03-15 VITALS — Wt 362.0 lb

## 2014-03-15 DIAGNOSIS — F509 Eating disorder, unspecified: Secondary | ICD-10-CM
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
## 2014-03-29 ENCOUNTER — Encounter: Payer: Medicaid Other | Attending: Internal Medicine | Admitting: *Deleted

## 2014-03-29 VITALS — Wt 358.1 lb

## 2014-03-29 DIAGNOSIS — Z6841 Body Mass Index (BMI) 40.0 and over, adult: Secondary | ICD-10-CM | POA: Diagnosis not present

## 2014-03-29 DIAGNOSIS — Z713 Dietary counseling and surveillance: Secondary | ICD-10-CM | POA: Diagnosis not present

## 2014-03-29 DIAGNOSIS — F509 Eating disorder, unspecified: Secondary | ICD-10-CM

## 2014-03-29 NOTE — Progress Notes (Signed)
Appointment start time: 1315  Appointment end time: 1400  Patient was seen on 03/29/14 for nutrition counseling pertaining to disordered eating, PCOS, and diabetes  Assessment: patient has lost some weight since last visit.  Is currently at lowest weight in very long time.  Was in [place] this past weekend and didn't prepare for meals.  She didn't eat very much.  Her stomach has been upset and she has been having diarrhea for a couple days.  She says this has happened before, but she was started on Tramadol for her hip pain on 03/14/14 and she's been having GI issues since. She's also change her diabetes medication to taking at night and doesn't take it with food.  She had a hypoglycemic event this past week.      She still struggles with eating on a regular basis.  She's had little luck setting alarms because her wake/sleep schedule is so erratic.  She is asking questions about juicing...  Nutrition diagnosis: Disordered eating pattern as related to meal skipping and history of binge eating. As evidenced by binge eating disorder and dietary recall  Intervention:  Drink low calorie gatorade while having diarrhea.  Take diabetes medication with food to see if that helps.  Practice self- care by getting pedicure from friend, take care of your body, dress to make yourself feel attractive.  Set up boundaries with online dating   Monitoring:  Patient will follow up in 2 weeks or prn

## 2014-04-17 ENCOUNTER — Ambulatory Visit: Payer: Medicaid Other | Admitting: *Deleted

## 2014-04-30 ENCOUNTER — Encounter (INDEPENDENT_AMBULATORY_CARE_PROVIDER_SITE_OTHER): Payer: Self-pay

## 2014-04-30 ENCOUNTER — Ambulatory Visit (HOSPITAL_COMMUNITY): Payer: Medicaid Other | Admitting: Licensed Clinical Social Worker
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
## 2014-05-01 ENCOUNTER — Encounter: Payer: Medicaid Other | Attending: Internal Medicine | Admitting: *Deleted

## 2014-05-01 VITALS — Wt 351.9 lb

## 2014-05-01 DIAGNOSIS — Z6841 Body Mass Index (BMI) 40.0 and over, adult: Secondary | ICD-10-CM | POA: Insufficient documentation

## 2014-05-01 DIAGNOSIS — Z713 Dietary counseling and surveillance: Secondary | ICD-10-CM | POA: Insufficient documentation

## 2014-05-01 DIAGNOSIS — E282 Polycystic ovarian syndrome: Secondary | ICD-10-CM

## 2014-05-01 DIAGNOSIS — E669 Obesity, unspecified: Secondary | ICD-10-CM
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
## 2014-05-01 NOTE — Patient Instructions (Addendum)
Aim to eat every 3-5 hours This will equate to eating 3-5 times/day Aim to have protein with all eating occasions  protein shake, protein bar, cheese, nuts, eggs, tuna, jerky, meat, peanut butter Limit sugars to 0-9 g/serving No beverages with meals, or 15 minutes before or after Try to increase non-starchy vegetables

## 2014-05-22 ENCOUNTER — Encounter: Payer: Medicaid Other | Attending: Internal Medicine | Admitting: *Deleted

## 2014-05-22 VITALS — Wt 353.4 lb

## 2014-05-22 DIAGNOSIS — Z713 Dietary counseling and surveillance: Secondary | ICD-10-CM | POA: Insufficient documentation

## 2014-05-22 DIAGNOSIS — F509 Eating disorder, unspecified: Secondary | ICD-10-CM

## 2014-05-22 DIAGNOSIS — Z6841 Body Mass Index (BMI) 40.0 and over, adult: Secondary | ICD-10-CM | POA: Insufficient documentation

## 2014-05-22 NOTE — Patient Instructions (Signed)
Cheaper proteins Cheese Eggs- unlimited  Dried beans Hotdogs Canned tuna or chicken Peanut butter Deli meats Cottage cheese sometimes Slim jim/jerky  Continue to chew slowly.  Continue to separate liquids from solids Try to get 3 eating occasions/day Limit caffeine and alcohol  Use some canned vegetables or gallons of water as weights while you wait for exercise

## 2014-05-22 NOTE — Progress Notes (Signed)
Appointment start time: 27  Appointment end time 1700  Assessment:  Alice Simpson is here for follow up nutrition counseling pertaining to her eating disorder (BED), obesity, type 2 diabetes, PCOS.  She is still waiting for bariatric surgery.  She is making progress there, heard from [facility] yesterday, Got some instructions from [employer].  She's been trying to chew more thoroughly in preparation.  She hs appointment wtith Dr. Ardath Sax the 19 for her psych assessment, the final assessment she needs for approval.  There are surgery openings at the end of the month and early February so she hopes to get the surgery soon.  She will have issues post op affording the supplies.  The shakes and the vitamins are not cheap and she can't afford food now.  I've tried to talk to her about this, also she still owes the $100 program fee.  Just because her surgery is covered, doesn't mean she isn't going to have several out of pocket expenses and she isn't good with reality.  She is having issues still with eating on regular schedule.  Her medications make her feel bloated and full She is eating more vegetables and trying to get protein, but she can't afford protein foods   Intervention: recommended cheaper proteins: Cheese Eggs- unlimited  Dried beans Hotdogs Canned tuna or chicken Peanut butter Deli meats Cottage cheese sometimes Slim jim/jerky  Continue to chew slowly.  Continue to separate liquids from solids Try to get 3 eating occasions/day Limit caffeine and alcohol  Use some canned vegetables or gallons of water as weights while you wait for exercise  Monitoring: patient will follow up in 2 weeks

## 2014-05-25 ENCOUNTER — Ambulatory Visit: Payer: Medicaid Other | Admitting: Internal Medicine

## 2014-06-05 ENCOUNTER — Encounter: Payer: Medicaid Other | Admitting: *Deleted

## 2014-06-05 DIAGNOSIS — F509 Eating disorder, unspecified: Secondary | ICD-10-CM

## 2014-06-05 DIAGNOSIS — E119 Type 2 diabetes mellitus without complications: Secondary | ICD-10-CM

## 2014-06-05 NOTE — Progress Notes (Signed)
Appointment start time: 39  Appointment end time 1700  Assessment:  Alice Simpson is here for follow up nutrition counseling pertaining to her eating disorder (BED), obesity, type 2 diabetes, PCOS.   She saw Dr. Ardath Sax.  She responeded above average for "generalized emotional eating, specific emotional eating," and she scored high for "cognitive restraint".  She thinks Dr. Ardath Sax will approve her for surgery.  Her therapist, [name], and I have many concerns with her going ahead with bariatric surgery: her binge-eating disorder is not under control; this assessment form Dr. Ardath Sax makes that clear.  She could have major complications from surgery if she is not able to comply with the diet.  Also, she has very limited financial resources.  She can not afford food currently.  I am very concerned she will not be able to afford the necessary vitamins, protein shakes, and bars that will be required after surgery.  She continues to engage in fantasy thinking and is not able to make plans for the future.  She keeps pushing off applying for assistance and when I ask her, each session, "what are you going to do about your finances?" she keeps making excuses and pushing things off.    An excerpt form an email with her therapist: "She is going to have more difficulty with the procedure/process than what I think she is willing to believe.  I have been urging her, here of late, to consider that she idealizes people and situations based on what she believes she needs/wants while choosing to ignore the warning signs.  My impression is that her eating choices don't really reflect someone able to prioritize ie telling me about ordering a DQ blizzard with hot fudge last evening...Marland KitchenMarland Kitchenhowever here we go anyway...Marland KitchenMarland Kitchen"    she attempted to kick her son out and while he was gone, she was at peace and ate on a schedule.  But when she's even remotely stressed out, she can't manage her emotions in a healthy way.  And she's frequently  stressed out because of her son, and her finances, and her "all or nothing" lifestyle.      Intervention: reiterated food bank/food pantries; gave scholarship application to [facility] so she can use the pool; recommended cheaper proteins: Cheese Eggs- unlimited  Dried beans Hotdogs Canned tuna or chicken Peanut butter Deli meats Cottage cheese sometimes Slim jim/jerky  Continue to chew slowly.  Continue to separate liquids from solids Try to get 3 eating occasions/day Limit caffeine and alcohol  Use some canned vegetables or gallons of water as weights while you wait for exercise  Monitoring: patient will follow up in 2 weeks

## 2014-06-05 NOTE — Patient Instructions (Signed)
Check out those food banks/food pantries for the vegetables as the very least Get your nuts/peanut butter with your oatmeal Hard boil your eggs Beans Cheese Tuna etc

## 2014-06-19 ENCOUNTER — Ambulatory Visit: Payer: Self-pay | Admitting: *Deleted

## 2014-07-26 ENCOUNTER — Encounter: Payer: Medicaid Other | Attending: Internal Medicine | Admitting: *Deleted

## 2014-07-26 VITALS — Wt 363.0 lb

## 2014-07-26 DIAGNOSIS — Z713 Dietary counseling and surveillance: Secondary | ICD-10-CM | POA: Diagnosis not present

## 2014-07-26 DIAGNOSIS — Z6841 Body Mass Index (BMI) 40.0 and over, adult: Secondary | ICD-10-CM | POA: Diagnosis not present

## 2014-07-26 DIAGNOSIS — F509 Eating disorder, unspecified: Secondary | ICD-10-CM

## 2014-07-26 NOTE — Progress Notes (Signed)
Appointment start time: 1500  Appointment end time 1600  Primary care provider: [name] Therapist: [name] Other providers: Philemon Kingdom, endocrinology  Patient was seen on 07/26/14 for nutrition counseling pertaining to disordered eating.  It has been 2 months since her last nutrition visit.  Assessment:  These past 2 months have been very difficult for her.   She engaged in frequent binge behavior.  She continued to struggle with her son, [name], but recently had jesse removed.  He is now banned from the property.  Her finances continue to be a struggle. She's applied for disabilty and for a job, but she is in a holding pattern.  She continues to struggle with food insecurity.  She doesn't have much food because she's been waiting to buy protein shakes in preparation for surgery.  She thought she would be approved for surgery by now and she's very frustrated.  She doesn't have much money for food so she hasn't really bought anything since she knows she needs to plan ahead for protein shakes and vitamins.  However, she is trying to buy low-fat dairy and has been taking some vitamins.  She bought some protein shakes, Oh Yea! Brand which meet the bariatric criteria.    She's been waiting to hear back from [facility] about her surgery.  [facility] hasn't submitted her paperwork yet because another patient with Medicaid wasn't approved.  [facility] is waiting to fix that other patient's paperwork before filing Adalida's.  Howeve,r Romanita's Medicaid will run out soon and she is still hoping to get hip surgery as well.    Had a hypoglycemic event last week.  Didn't have anything to fix it, or wasn't sure how to fix it.  Binged until she felt better  Nutrition diagnosis:  Disordered eating pattern as related to inconsistent meals and subsequent binge eating.  As evidenced by BED  Intervention: reiterated food bank/food pantries and farmers' market.  Discussed proper treatment for hypoglycemia: rule of 15.  I will  follow up with bariatric team to find out what is the hold up.  Gave pre-op diet handout to use a guide for meal and snack planning  Continue to chew slowly.  Continue to separate liquids from solids Try to get 3 eating occasions/day Limit caffeine and alcohol  Use some canned vegetables or gallons of water as weights while you wait for exercise  Monitoring: patient will follow up in 2 weeks

## 2014-08-07 ENCOUNTER — Encounter: Payer: Medicaid Other | Admitting: *Deleted

## 2014-08-07 VITALS — Wt 361.0 lb

## 2014-08-07 DIAGNOSIS — F5081 Binge eating disorder: Secondary | ICD-10-CM

## 2014-08-07 DIAGNOSIS — E119 Type 2 diabetes mellitus without complications: Secondary | ICD-10-CM

## 2014-08-07 NOTE — Progress Notes (Signed)
Appointment start time: 1700  Appointment end time: 1800  Patient was seen on 08/07/14 for nutrition counseling pertaining to disordered eating  Primary care provider: Darcus Simpson Therapist: Jeremy Simpson   Assessment: Has started keeping food logs again, but did not bring them to her visit today.  She's not eating on a schedule, but she is documenting what she's eating.  She made some hardboiled eggs to have on hand.  Today she felt hypoglycemic so got ice cream, cookies, and tea.  I asked why she didn't have glucose tablets and she reports not being able to afford them She states her blood sugars have been running higher lately, maybe because of the steroid shot she got in her hip a couple weeks ago.  Her fasting glucose is elevated, but she also is eating later at night.  She has started taking back her psychotropic medications that she had stopped taking.  She's in a lot of pain and that could affect her glucose levels as well.  She has not been seen by endocrinology since October of last year and does not have any upcoming appointments.  She cancelled her last visits due to financial constraints.  If she ever gets approved for gastric bypass surgery, her glucose levels will most likely stabilize as is often the case.   Per bariatric nurse coordinator, Alice Simpson, Alice Simpson's paperwork for surgery had not been filed because staff was not aware of the time constraints.  Alice Simpson's insurance is going to expire in a few months and she would like to have hip replacement surgery before then.  She is not eligible for hip surgery until she loses weight.   She is going back next week for another shot in her hip, but the next option is to severe the nerves.  She has asked about a referral for pain management   Her finances continue to be a source of stress.  She was denied social security disability, but she has a phone interview with [employer] next week   Mental health diagnosis: BED  Dietary  assessment: Breakfast/lunch is Grapes, orange, hardboiled eggs or cheese and oatmeal If she eats sweets, she will have protein with it She bought cookies, but hasn't eaten many of them.  Now that Alice Simpson is gone, she doesn't feel like she has to eat it all at once.   She still does not eat regularly scheduled meals or snacks.  She continues to eat only at her desk while on the Internet.  The other night she wanted [employer], but instead went to [name].  She then did go to [employer] (just for a drink) but got a drink, fries, and burger.  She says she felt like she could not make it home without eating.  She also had not eaten anything else that day prior.  Her eating habits continue to be irregular  Has binged some, but it's not emotional she says- it's because she's didn't eat during the day and/or because she's in so much pain.   Compensatory behaviors: none          Binge: often  Estimated energy intake: [phone] kcal  Estimated energy needs: 2300-2800 kcal 270 g CHO 180 g pro 67 g fat  Nutrition Diagnosis: NB-1.5 Disordered eating pattern As related to meal skipping and binge eating.  As evidenced by dietary recall.  Intervention/Goals: please complete 7 day food record.  Gave packet of glucose tablets and instructed her to take 3-4 for hypoglycemia.  Ice cream, cookies, and  tea are not an appropriate treatment for hypoglycemia.  Alice Simpson felt low in session today and checked her glucose.  It was 126 mg/dl.      Monitoring and Evaluation: Patient will follow up in 2 weeks.

## 2014-08-17 ENCOUNTER — Other Ambulatory Visit: Payer: Self-pay | Admitting: *Deleted

## 2014-08-17 ENCOUNTER — Telehealth: Payer: Self-pay | Admitting: Internal Medicine

## 2014-08-17 MED ORDER — GLIPIZIDE 10 MG PO TABS: 10.0000 mg | ORAL_TABLET | Freq: Every day | ORAL | Status: DC

## 2014-08-17 MED ORDER — SITAGLIPTIN PHOSPHATE 100 MG PO TABS
100.0000 mg | ORAL_TABLET | Freq: Every day | ORAL | Status: DC
Start: 2014-08-17 — End: 2018-07-06

## 2014-08-17 MED ORDER — METFORMIN HCL ER 500 MG PO TB24: 2000.0000 mg | ORAL_TABLET | Freq: Every day | ORAL | Status: DC

## 2014-08-17 NOTE — Telephone Encounter (Signed)
Called pt and advised her per Dr Ronnie Derby message below. Advised pt to call on Monday to let us know what her blood sugar readings are. Pt voiced understanding.

## 2014-08-17 NOTE — Telephone Encounter (Signed)
What kind of steroid shots and for how long? Would recommend that she increase her glipizide to 1 tablet before each meal and one at bedtime.  If blood sugars are not improved by Monday she will need to take some insulin also

## 2014-08-17 NOTE — Telephone Encounter (Signed)
Patient stated that she is taking steroid shots and her b/s reading has been high, last night  7:10 am - 305,  10:00 pm - 246, this morning 9:43 am 2: 46.  Please advise

## 2014-08-17 NOTE — Telephone Encounter (Signed)
Please read message below and advise in Dr Gherghe's absence.  

## 2014-08-21 ENCOUNTER — Ambulatory Visit: Payer: Self-pay | Admitting: *Deleted

## 2014-08-30 ENCOUNTER — Ambulatory Visit: Payer: Self-pay | Admitting: *Deleted

## 2014-09-03 ENCOUNTER — Encounter: Payer: Medicaid Other | Attending: Internal Medicine

## 2014-09-03 DIAGNOSIS — Z6841 Body Mass Index (BMI) 40.0 and over, adult: Secondary | ICD-10-CM | POA: Insufficient documentation

## 2014-09-03 DIAGNOSIS — Z713 Dietary counseling and surveillance: Secondary | ICD-10-CM | POA: Diagnosis not present

## 2014-09-04 ENCOUNTER — Encounter: Payer: Medicaid Other | Admitting: *Deleted

## 2014-09-04 DIAGNOSIS — E119 Type 2 diabetes mellitus without complications: Secondary | ICD-10-CM

## 2014-09-04 DIAGNOSIS — F509 Eating disorder, unspecified: Secondary | ICD-10-CM

## 2014-09-04 NOTE — Progress Notes (Signed)
Appointment start time: 0800  Appointment end time: 0900  Assessment:  Alice Simpson is here for follow up nutrition counseling pertaining to binge eating disorder.  She was approved for bariatric surgery, scheduled May 9.  She attended pre-op class yesterday and will be starting a pre-op diet 2 weeks prior to surgery.  She will be working with the bariatric dietitians going forward and this is her last scheduled visit with this dietitian.    She is trying to prepare for surgery and the dietary changes she will have to make.  She has bought several protein shakes and investigated which types of frozen meals are permitted on the bariatric diet.  She is also planning on eating her favorite foods this next week "while she can" and had burger and fries last night, plans on getting a banana split later this week, and [employer].   She is struggling with her relationship with her son, [name].  He needs help and she wants to help him, but he is not receptive to getting help.    Intervention:  The surgery will not be a quick fix.  The surgery will not take away her desire to binge.  It will also not make it impossible to binge: there will be physical discomfort if she binges, but she can still do it.  This procedure will not cure her eating disorder.  She will still need to work very hard with her therapist to help manage her urges and the "all or nothing" thinking.  Discussed risk for hypoglycemia on restrictive high protein diet.  If symptoms exist, check glucose first.  If glucose <70 mg/dl, treat with 3-4 glucose tablets.     Discussed prioritizing herself and her needs.  [name] needs to want to help himself and until he does, she can't help him.  Getting involved in his life is destructive to her own.  Focus on what she can control (herself).  Monitoring:  Patient will follow up with therapist, [name] for mental health concerns and with bariatric dietitians for her nutrition concerns.    Samples  given: Premier protein shakes Chocolate (2)  Lot # [id number]; exp: 04/03/15 Vanilla (2)  Lot # [id number], exp: 05/10/15 Strawberry (4)   Lot # [id number], exp: 01/06/15

## 2014-09-04 NOTE — Progress Notes (Signed)
  Pre-Operative Nutrition Class:  Appt start time: 3358   End time:  1830.  Patient was seen on 09/03/14 for Pre-Operative Bariatric Surgery Education at [facility].   Surgery date:  Surgery type: RYGB Start weight at [facility]: 363.5 on 02/20/14 Weight today: 360 lbs  TANITA  BODY COMP RESULTS  09/03/14   BMI (kg/m^2) 54.7   Fat Mass (lbs) 190.5   Fat Free Mass (lbs) 169.5   Total Body Water (lbs) 124   Samples given per MNT protocol. Patient educated on appropriate usage: PB2 (chocolate - qty 1) Lot #: None Exp: 11/16  Premier protein shake (strawberry - qty 1) Lot #: [id number] Exp: 9/16  Bariatric Advantage calcium citrate chew (caramel - qty 1) Lot #: [id number] Exp: 5/16   The following the learning objectives were met by the patient during this course:  Identify Pre-Op Dietary Goals and will begin 2 weeks pre-operatively  Identify appropriate sources of fluids and proteins   State protein recommendations and appropriate sources pre and post-operatively  Identify Post-Operative Dietary Goals and will follow for 2 weeks post-operatively  Identify appropriate multivitamin and calcium sources  Describe the need for physical activity post-operatively and will follow MD recommendations  State when to call healthcare provider regarding medication questions or post-operative complications  Handouts given during class include:  Pre-Op Bariatric Surgery Diet Handout  Protein Shake Handout  Post-Op Bariatric Surgery Nutrition Handout  BELT Program Information Flyer  Support Group Information Flyer  [facility] Bariatric Supplements Price List  Follow-Up Plan: Patient will follow-up at [facility] 2 weeks post operatively for diet advancement per MD.

## 2014-09-05 ENCOUNTER — Ambulatory Visit: Payer: Self-pay | Admitting: Surgery

## 2014-09-05 NOTE — H&P (Signed)
Chief Complaint:  For sleeve gastrectomy   History of Present Illness:  Alice Simpson is an 49 y.o. female who presents on April 20th for her preop for sleeve gastrectomy.  Informed consent and questions answered.  Her primary is [name], MD.  She has type II DM and a lifelong history of obesity.  She has severe hip pain and will be receiving an injection next week.  Her dependence on NSAIDS is a main reason to have a sleeve instead of a bypass.  She takes a low dose BCP for PCOS and I told her to stay on this since she feels bad when off of it.    Past Medical History  Diagnosis Date  . Diabetes mellitus without complication   . Hyperlipidemia   . Depression   . Anxiety   . Migraine   . Osteoarthritis   . Neuropathy   . PCOS (polycystic ovarian syndrome)     Past Surgical History  Procedure Laterality Date  . Cesarean section    . Wisdom    . Wisdom tooth extraction    . Bunionectomy      Current Outpatient Prescriptions  Medication Sig Dispense Refill  . ALPRAZolam (XANAX) 0.5 MG tablet Take 0.5 mg by mouth as needed.     Marland Kitchen buPROPion (WELLBUTRIN XL) 150 MG 24 hr tablet 150 mg.    . calcium carbonate (TUMS - DOSED IN MG ELEMENTAL CALCIUM) 500 MG chewable tablet Chew 1 tablet by mouth daily.    . cyanocobalamin 500 MCG tablet Take 500 mcg by mouth daily.    Marland Kitchen FLUoxetine (PROZAC) 40 MG capsule Take 40 mg by mouth daily.     . Gabapentin, PHN, 300 MG TABS Increase as instructed up to 1 tablet (300 mg total) by mouth 3 times daily.    Marland Kitchen glipiZIDE (GLUCOTROL) 10 MG tablet Take 1 tablet (10 mg total) by mouth daily before breakfast. 30 tablet 1  . HYDROcodone-acetaminophen (VICODIN) 5-500 MG per tablet Take 1 tablet by mouth every 6 (six) hours as needed.     Marland Kitchen losartan (COZAAR) 25 MG tablet Take 25 mg by mouth daily.     . Meclizine HCl (TRAVEL SICKNESS) 25 MG CHEW 25 mg.    . metaxalone (SKELAXIN) 800 MG tablet Take 800 mg by mouth as needed.     . metFORMIN (GLUCOPHAGE-XR)  500 MG 24 hr tablet Take 4 tablets (2,000 mg total) by mouth daily. 120 tablet 1  . Multiple Vitamin (MULTI-VITAMINS) TABS 1 tablet.    . norethindrone (MICRONOR,CAMILA,ERRIN) 0.35 MG tablet Take 1 tablet (0.35 mg total) by mouth daily. 1 Package 11  . ondansetron (ZOFRAN) 4 MG tablet Take 4 mg by mouth as needed.     . simvastatin (ZOCOR) 40 MG tablet Take 40 mg by mouth daily at 6 PM.     . sitaGLIPtin (JANUVIA) 100 MG tablet Take 1 tablet (100 mg total) by mouth daily. 30 tablet 1  . traMADol (ULTRAM) 50 MG tablet Take by mouth every 6 (six) hours as needed.    . traZODone (DESYREL) 50 MG tablet Take 50 mg by mouth at bedtime.      No current facility-administered medications for this visit.   Penicillin g and Prochlorperazine Family History  Problem Relation Age of Onset  . Diabetes Other   . Depression Other   . Anxiety disorder Other    Social History:   reports that she quit smoking about 8 years ago. Her smoking use included  Cigarettes. She has never used smokeless tobacco. Her alcohol and drug histories are not on file.   REVIEW OF SYSTEMS : Negative except for see problem list  Physical Exam:   There were no vitals taken for this visit. There is no weight on file to calculate BMI.  Gen:  WDWN WF NAD  Neurological: Alert and oriented to person, place, and time. Motor and sensory function is grossly intact  Head: Normocephalic and atraumatic.  Eyes: Conjunctivae are normal. Pupils are equal, round, and reactive to light. No scleral icterus.  Neck: Normal range of motion. Neck supple. No tracheal deviation or thyromegaly present.  Cardiovascular:  SR without murmurs or gallops.  No carotid bruits Breast:  Not examined Respiratory: Effort normal.  No respiratory distress. No chest wall tenderness. Breath sounds normal.  No wheezes, rales or rhonchi.  Abdomen:  Obese and nontender GU:  Not examined today Musculoskeletal: Normal range of motion. Extremities are nontender. No  cyanosis, edema or clubbing noted Lymphadenopathy: No cervical, preauricular, postauricular or axillary adenopathy is present Skin: Skin is warm and dry. No rash noted. No diaphoresis. No erythema. No pallor. Pscyh: Normal mood and affect. Behavior is normal. Judgment and thought content normal.   LABORATORY RESULTS: No results found for this or any previous visit (from the past 48 hour(s)).   RADIOLOGY RESULTS: No results found.  Problem List: Patient Active Problem List   Diagnosis Date Noted  . PCOS (polycystic ovarian syndrome) 08/02/2013  . Depressive disorder 05/26/2013  . Type II or unspecified type diabetes mellitus without mention of complication, uncontrolled 05/26/2013  . Hyperlipidemia 05/26/2013  . Low back pain 05/26/2013  . Migraine 05/26/2013    Assessment & Plan: Morbid obesity with type II DM and ostearthritis of the hip for lap sleeve gastrectomy    Matt B. Hassell Done, MD, [facility] [phone] beeper [phone]  09/05/2014 11:33 AM

## 2014-09-17 ENCOUNTER — Encounter (HOSPITAL_COMMUNITY): Payer: Self-pay

## 2014-09-17 ENCOUNTER — Encounter (HOSPITAL_COMMUNITY)
Admission: RE | Admit: 2014-09-17 | Discharge: 2014-09-17 | Disposition: A | Discharge status: Home / Self Care | Payer: Medicaid Other | Source: Ambulatory Visit | Attending: Surgery | Admitting: Surgery

## 2014-09-17 DIAGNOSIS — Z01812 Encounter for preprocedural laboratory examination: Secondary | ICD-10-CM | POA: Insufficient documentation

## 2014-09-17 LAB — CBC WITH DIFFERENTIAL/PLATELET
BASOS ABS: 0.1 10*3/uL (ref 0.0–0.1)
Basophils Relative: 0 % (ref 0–1)
EOS ABS: 0.3 10*3/uL (ref 0.0–0.7)
EOS PCT: 2 % (ref 0–5)
HEMATOCRIT: 42 % (ref 36.0–46.0)
Hemoglobin: 13.6 g/dL (ref 12.0–15.0)
Lymphocytes Relative: 24 % (ref 12–46)
Lymphs Abs: 3.9 10*3/uL (ref 0.7–4.0)
MCH: 26.6 pg (ref 26.0–34.0)
MCHC: 32.4 g/dL (ref 30.0–36.0)
MCV: 82 fL (ref 78.0–100.0)
Monocytes Absolute: 1.3 10*3/uL — ABNORMAL HIGH (ref 0.1–1.0)
Monocytes Relative: 8 % (ref 3–12)
NEUTROS ABS: 10.5 10*3/uL — AB (ref 1.7–7.7)
Neutrophils Relative %: 66 % (ref 43–77)
Platelets: 390 10*3/uL (ref 150–400)
RBC: 5.12 MIL/uL — ABNORMAL HIGH (ref 3.87–5.11)
RDW: 13.2 % (ref 11.5–15.5)
WBC: 15.9 10*3/uL — AB (ref 4.0–10.5)

## 2014-09-17 LAB — COMPREHENSIVE METABOLIC PANEL
ALK PHOS: 77 U/L (ref 38–126)
ALT: 28 U/L (ref 14–54)
ANION GAP: 13 (ref 5–15)
AST: 27 U/L (ref 15–41)
Albumin: 4.3 g/dL (ref 3.5–5.0)
BILIRUBIN TOTAL: 0.8 mg/dL (ref 0.3–1.2)
BUN: 21 mg/dL — AB (ref 6–20)
CO2: 27 mmol/L (ref 22–32)
Calcium: 10 mg/dL (ref 8.9–10.3)
Chloride: 100 mmol/L — ABNORMAL LOW (ref 101–111)
Creatinine, Ser: 0.69 mg/dL (ref 0.44–1.00)
GFR calc Af Amer: >60 mL/min (ref >60)
GLUCOSE: 84 mg/dL (ref 70–99)
Potassium: 4 mmol/L (ref 3.5–5.1)
Sodium: 140 mmol/L (ref 135–145)
Total Protein: 7.9 g/dL (ref 6.5–8.1)

## 2014-09-17 LAB — HCG, SERUM, QUALITATIVE: PREG SERUM: NEGATIVE

## 2014-09-17 NOTE — Patient Instructions (Signed)
[place]  09/17/2014   Your procedure is scheduled on:      Monday Sep 24, 2014   Report to [facility] Main Entrance and follow signs to  [place] arrive at [place] AM.   Call this number if you have problems the morning of surgery [phone] or Presurgical Testing [phone].   Remember:  Do not eat food or drink liquids :After Midnight. EAT A HEALTHY SNACK NIGHT PRIOR TO SURGERY.  For Living Will and/or Health Care Power Attorney Forms: please provide copy for your medical record, may bring AM of surgery (forms should be already notarized-we do not provide this service).     Take these medicines the morning of surgery with A SIP OF WATER: Alprazolam (Xanax) if needed; Bupropion (Wellbutrin); Hydrocodone-Acetaminophen if needed                               You may not have any metal on your body including hair pins and piercings  Do not wear jewelry, make-up, lotions, powders, perfumes, nail polish or deodorant.  Do not shave body hair  48 hours(2 days) of CHG soap use.                Do not bring valuables to the hospital. [place].  Contacts, dentures or bridgework may not be worn into surgery.  Leave suitcase in the car. After surgery it may be brought to your room.  For patients admitted to the hospital, checkout time is 11:00 AM the day of discharge.   ________________________________________________________________________  [facility] - Preparing for Surgery Before surgery, you can play an important role.  Because skin is not sterile, your skin needs to be as free of germs as possible.  You can reduce the number of germs on your skin by washing with CHG (chlorahexidine gluconate) soap before surgery.  CHG is an antiseptic cleaner which kills germs and bonds with the skin to continue killing germs even after washing. Please DO NOT use if you have an allergy to CHG or antibacterial soaps.  If your skin becomes  reddened/irritated stop using the CHG and inform your nurse when you arrive at Short Stay. Do not shave (including legs and underarms) for at least 48 hours prior to the first CHG shower.  You may shave your face/neck. Please follow these instructions carefully:  1.  Shower with CHG Soap the night before surgery and the  morning of Surgery.  2.  If you choose to wash your hair, wash your hair first as usual with your  normal  shampoo.  3.  After you shampoo, rinse your hair and body thoroughly to remove the  shampoo.                           4.  Use CHG as you would any other liquid soap.  You can apply chg directly  to the skin and wash                       Gently with a scrungie or clean washcloth.  5.  Apply the CHG Soap to your body ONLY FROM THE NECK DOWN.   Do not use on face/ open  Wound or open sores. Avoid contact with eyes, ears mouth and genitals (private parts).                       Wash face,  Genitals (private parts) with your normal soap.             6.  Wash thoroughly, paying special attention to the area where your surgery  will be performed.  7.  Thoroughly rinse your body with warm water from the neck down.  8.  DO NOT shower/wash with your normal soap after using and rinsing off  the CHG Soap.                9.  Pat yourself dry with a clean towel.            10.  Wear clean pajamas.            11.  Place clean sheets on your bed the night of your first shower and do not  sleep with pets. Day of Surgery : Do not apply any lotions/deodorants the morning of surgery.  Please wear clean clothes to the hospital/surgery center.  FAILURE TO FOLLOW THESE INSTRUCTIONS MAY RESULT IN THE CANCELLATION OF YOUR SURGERY PATIENT SIGNATURE_________________________________  NURSE SIGNATURE__________________________________  ________________________________________________________________________

## 2014-09-17 NOTE — Progress Notes (Signed)
CXR per epic 02/13/2014 EKG per epic 02/13/2014

## 2014-09-17 NOTE — Progress Notes (Signed)
CBCD results per epic per PAT visit 09/17/2014 sent to Dr Rockne Coons

## 2014-09-21 ENCOUNTER — Ambulatory Visit (INDEPENDENT_AMBULATORY_CARE_PROVIDER_SITE_OTHER): Payer: Medicaid Other | Admitting: Internal Medicine

## 2014-09-21 VITALS — BP 118/66 | HR 96 | Temp 97.9°F | Resp 14 | Wt 353.0 lb

## 2014-09-21 DIAGNOSIS — IMO0002 Reserved for concepts with insufficient information to code with codable children: Secondary | ICD-10-CM

## 2014-09-21 DIAGNOSIS — E1165 Type 2 diabetes mellitus with hyperglycemia: Secondary | ICD-10-CM | POA: Diagnosis not present

## 2014-09-21 LAB — HEMOGLOBIN A1C: Hgb A1c MFr Bld: 6.9 % — ABNORMAL HIGH (ref 4.6–6.5)

## 2014-09-21 MED ORDER — METFORMIN HCL 500 MG/5ML PO SOLN: 7.5000 mL | Freq: Three times a day (TID) | ORAL | Status: DC

## 2014-09-21 NOTE — Patient Instructions (Addendum)
Please stop Glipizide XL. Continue Januvia 100 mg daily. Stop Metformin XR for now and start Metformin liquid 7.5 mL 3x a day, with meals.   Please stop at the lab.  Please come back for a follow-up appointment in 2 months.

## 2014-09-21 NOTE — Progress Notes (Signed)
Patient ID: Alice Simpson, female   DOB: [birth year], 49 y.o.   MRN: [id number]  HPI: Alice Simpson is a 49 y.o.-year-old female, returning for follow-up for DM2, non-insulin-dependent, dx ~ 2007, uncontrolled, without complications. Last visit 7 mo ago! She is late 10 minutes for her 49 minute appointment.  Last hemoglobin A1c was:  Lab Results  Component Value Date   HGBA1C 7.5* 02/22/2014   HGBA1C 7.6* 11/02/2013   HGBA1C 7.7* 06/09/2013  HbA1c 7.1% in 01/25/2013. She had steroid inj's in back. Most recent: 2 weeks ago. She will need to have a hip replacement.  Pt is on a regimen of: - Metformin XR 500 mg in am and 1500 mg at dinner - Januvia 100 mg - Glipizide XL10 mg   She is on a presurgical diet (for GBP - gastric sleeve).  Pt checks her sugars 2x a day and they are (no log): - am: 109-201 (130-170) >> 130-140 lately >> 144-176, 194 (snacks at night) >> 130s (higher with steroid inj) - 2h after b'fast: n/c >> 1h after: 160-165 >> 80s (checks only when jittery!) - before lunch: n/c >> 129, 171 >> n/c >> 156 >> n/c - 2h after lunch: n/c >> 145 >> n/c >> 82, 239 after donuts >> n/c - before dinner: n/c >> 102-135 (227) >> n/c >> 87 >> 127 - 2h after dinner: n/c >> 113, 118, 167, 179 >> n/c - bedtime: n/c >> 136, 151, 152, 174 <<140  >> 122, 183 >> n/c - nighttime: 131, 204 >> 141 No lows. Lowest sugar was 80s; she has hypoglycemia awareness at 70.  Highest sugar was 194 >> 420 after the inj.  She is seen by Ozzie Hoyle (nutrition) and also by Cassandria Santee (food therapist).   She is having gastric bypass scheduled for 09/24/2014.  - no CKD, last BUN/creatinine: Lab Results  Component Value Date   BUN 21* 09/17/2014   BUN 7 03/11/2010   CREATININE 0.69 09/17/2014   CREATININE 0.64 03/11/2010  She is on Losartan. - Lipids: 08/08/2013: 185/151/69/86 She is on Zocor. - last eye exam was in 12/2013 >> No DR (Dr Prudencio Burly) - no numbness and tingling   She was dx  with PCOS in 2007. She is on Ortho-micronor (norethindrone) and has withdrawal bleedings every month.  Tried another OCP >> did not feel well on it  ROS: Constitutional: no weight gain/loss, no fatigue, no subjective hyperthermia/hypothermia Eyes: no blurry vision, no xerophthalmia ENT: no sore throat, no nodules palpated in throat, no dysphagia/odynophagia, no hoarseness Cardiovascular: no CP/SOB/palpitations/leg swelling Respiratory: no cough/SOB Gastrointestinal: no N/V/D/C Musculoskeletal: no muscle/+ joint aches: back, hip, SI joint pain Skin: no rashes Neurological: no tremors/numbness/tingling/dizziness  I reviewed pt's medications, allergies, PMH, social hx, family hx, and changes were documented in the history of present illness. Otherwise, unchanged from my initial visit note.  PE: BP 118/66 mmHg  Pulse 96  Temp(Src) 97.9 F (36.6 C) (Oral)  Resp 14  Wt 353 lb (160.12 kg)  SpO2 95%  LMP 09/03/2014 (Exact Date) Body mass index is 53.69 kg/(m^2).  Wt Readings from Last 3 Encounters:  09/21/14 353 lb (160.12 kg)  09/04/14 360 lb (163.295 kg)  08/07/14 361 lb (163.749 kg)   Constitutional: obesein NAD Eyes: PERRLA, EOMI, no exophthalmos ENT: moist mucous membranes, no thyromegaly, no cervical lymphadenopathy Cardiovascular: RRR, No MRG Respiratory: CTA B Gastrointestinal: abdomen soft, NT, ND, BS+ Musculoskeletal: no deformities, strength intact in all 4 Skin: moist, warm, no rashes  ASSESSMENT: 1. DM2, non-insulin-dependent, uncontrolled, without complications  2. Obesity  PLAN:  1. Patient with long-standing, uncontrolled diabetes, on oral antidiabetic regimen. She is on a presurgical diet (lost 7 lbs in <1 mo) before her gastric sleeve surgery. She had high sugars after her steroid injections, but otherwise she has lows on the new diet. Will stop her Glipizide XL. With her upcoming gastric sleeve surgery >> she will need to be off XR meds >> will switch to  Metformin liquid. She may need to decrease the dose after the surgery. Continue Januvia for now >> may need to stop this, also, after the Sx. Patient Instructions  Please stop Glipizide XL. Continue Januvia 100 mg daily. Stop Metformin XR for now and start Metformin liquid 7.5 mL 3x a day, with meals.   Please stop at the lab.  Please come back for a follow-up appointment in 2 months.   - continue checking sugars at different times of the day - check 2 times a day, rotating checks - advised for yearly eye exams - she is UTD - given more sugar logs - check a HbA1c today - Return to clinic in 2 mo with sugar log   2. Obesity - she has upcoming gastric bypass types >> her DM will improve afterwards - we will likely need to stop Glipizide (and maybe Januvia too) right after the Sx  Office Visit on 09/21/2014  Component Date Value Ref Range Status  . Hgb A1c MFr Bld 09/21/2014 6.9* 4.6 - 6.5 % Final   Glycemic Control Guidelines for People with Diabetes:Non Diabetic:  <6%Goal of Therapy: <7%Additional Action Suggested:  >8%   HbA1c improved.

## 2014-09-24 ENCOUNTER — Encounter (HOSPITAL_COMMUNITY)
Admission: RE | Disposition: A | Discharge status: Home / Self Care | Payer: Self-pay | Source: Ambulatory Visit | Attending: Surgery

## 2014-09-24 ENCOUNTER — Inpatient Hospital Stay (HOSPITAL_COMMUNITY): Payer: Medicaid Other | Admitting: Registered Nurse

## 2014-09-24 ENCOUNTER — Encounter (HOSPITAL_COMMUNITY): Payer: Self-pay | Admitting: Registered Nurse

## 2014-09-24 ENCOUNTER — Inpatient Hospital Stay (HOSPITAL_COMMUNITY)
Admission: RE | Admit: 2014-09-24 | Discharge: 2014-09-26 | DRG: 621 | Disposition: A | Discharge status: Home / Self Care | Payer: Medicaid Other | Source: Ambulatory Visit | Attending: Surgery | Admitting: Surgery

## 2014-09-24 ENCOUNTER — Encounter: Payer: Self-pay | Admitting: Internal Medicine

## 2014-09-24 DIAGNOSIS — F419 Anxiety disorder, unspecified: Secondary | ICD-10-CM | POA: Diagnosis present

## 2014-09-24 DIAGNOSIS — F329 Major depressive disorder, single episode, unspecified: Secondary | ICD-10-CM | POA: Diagnosis present

## 2014-09-24 DIAGNOSIS — M1610 Unilateral primary osteoarthritis, unspecified hip: Secondary | ICD-10-CM | POA: Diagnosis present

## 2014-09-24 DIAGNOSIS — E282 Polycystic ovarian syndrome: Secondary | ICD-10-CM | POA: Diagnosis present

## 2014-09-24 DIAGNOSIS — E119 Type 2 diabetes mellitus without complications: Secondary | ICD-10-CM | POA: Diagnosis present

## 2014-09-24 DIAGNOSIS — Z6841 Body Mass Index (BMI) 40.0 and over, adult: Secondary | ICD-10-CM | POA: Diagnosis not present

## 2014-09-24 DIAGNOSIS — E785 Hyperlipidemia, unspecified: Secondary | ICD-10-CM | POA: Diagnosis present

## 2014-09-24 DIAGNOSIS — Z87891 Personal history of nicotine dependence: Secondary | ICD-10-CM | POA: Diagnosis not present

## 2014-09-24 DIAGNOSIS — G43909 Migraine, unspecified, not intractable, without status migrainosus: Secondary | ICD-10-CM | POA: Diagnosis present

## 2014-09-24 DIAGNOSIS — Z9884 Bariatric surgery status: Secondary | ICD-10-CM

## 2014-09-24 DIAGNOSIS — G629 Polyneuropathy, unspecified: Secondary | ICD-10-CM | POA: Diagnosis present

## 2014-09-24 HISTORY — PX: LAPAROSCOPIC GASTRIC SLEEVE RESECTION: SHX5895

## 2014-09-24 LAB — CREATININE, SERUM
CREATININE: 0.95 mg/dL (ref 0.44–1.00)
GFR calc Af Amer: >60 mL/min (ref >60)
GFR calc non Af Amer: >60 mL/min (ref >60)

## 2014-09-24 LAB — CBC
HCT: 39.2 % (ref 36.0–46.0)
Hemoglobin: 12.7 g/dL (ref 12.0–15.0)
MCH: 26.6 pg (ref 26.0–34.0)
MCHC: 32.4 g/dL (ref 30.0–36.0)
MCV: 82 fL (ref 78.0–100.0)
Platelets: 335 10*3/uL (ref 150–400)
RBC: 4.78 MIL/uL (ref 3.87–5.11)
RDW: 13 % (ref 11.5–15.5)
WBC: 16.5 10*3/uL — AB (ref 4.0–10.5)

## 2014-09-24 LAB — PREGNANCY, URINE: Preg Test, Ur: NEGATIVE

## 2014-09-24 LAB — GLUCOSE, CAPILLARY
GLUCOSE-CAPILLARY: 207 mg/dL — AB (ref 70–99)
Glucose-Capillary: 124 mg/dL — ABNORMAL HIGH (ref 70–99)
Glucose-Capillary: 158 mg/dL — ABNORMAL HIGH (ref 70–99)
Glucose-Capillary: 203 mg/dL — ABNORMAL HIGH (ref 70–99)
Glucose-Capillary: 218 mg/dL — ABNORMAL HIGH (ref 70–99)
Glucose-Capillary: 222 mg/dL — ABNORMAL HIGH (ref 70–99)

## 2014-09-24 SURGERY — GASTRECTOMY, SLEEVE, LAPAROSCOPIC
Anesthesia: General

## 2014-09-24 MED ORDER — PROPOFOL 10 MG/ML IV BOLUS
INTRAVENOUS | Status: DC | PRN
  Administered 2014-09-24: 250 mg via INTRAVENOUS

## 2014-09-24 MED ORDER — GLYCOPYRROLATE 0.2 MG/ML IJ SOLN
INTRAMUSCULAR | Status: DC | PRN
  Administered 2014-09-24: .8 mg via INTRAVENOUS

## 2014-09-24 MED ORDER — DEXAMETHASONE SODIUM PHOSPHATE 10 MG/ML IJ SOLN
INTRAMUSCULAR | Status: AC
  Filled 2014-09-24: qty 1

## 2014-09-24 MED ORDER — ACETAMINOPHEN 10 MG/ML IV SOLN
1000.0000 mg | Freq: Once | INTRAVENOUS | Status: AC
  Administered 2014-09-24: 1000 mg via INTRAVENOUS
  Filled 2014-09-24: qty 100

## 2014-09-24 MED ORDER — KETOROLAC TROMETHAMINE 30 MG/ML IJ SOLN
30.0000 mg | Freq: Once | INTRAMUSCULAR | Status: DC | PRN
Start: 2014-09-24 — End: 2014-09-24

## 2014-09-24 MED ORDER — GLYCOPYRROLATE 0.2 MG/ML IJ SOLN
INTRAMUSCULAR | Status: AC
  Filled 2014-09-24: qty 4

## 2014-09-24 MED ORDER — UNJURY CHICKEN SOUP POWDER: 2.0000 [oz_av] | Freq: Four times a day (QID) | ORAL | Status: DC

## 2014-09-24 MED ORDER — EPHEDRINE SULFATE 50 MG/ML IJ SOLN
INTRAMUSCULAR | Status: DC | PRN
  Administered 2014-09-24: 5 mg via INTRAVENOUS
  Administered 2014-09-24 (×2): 10 mg via INTRAVENOUS

## 2014-09-24 MED ORDER — MORPHINE SULFATE 2 MG/ML IJ SOLN
2.0000 mg | INTRAMUSCULAR | Status: DC | PRN
  Administered 2014-09-24 (×3): 6 mg via INTRAVENOUS
  Administered 2014-09-24: 4 mg via INTRAVENOUS
  Administered 2014-09-24 – 2014-09-25 (×9): 6 mg via INTRAVENOUS
  Filled 2014-09-24 (×11): qty 3
  Filled 2014-09-24: qty 2
  Filled 2014-09-24: qty 3

## 2014-09-24 MED ORDER — CIPROFLOXACIN IN D5W 400 MG/200ML IV SOLN
INTRAVENOUS | Status: DC | PRN
  Administered 2014-09-24: 400 mg via INTRAVENOUS

## 2014-09-24 MED ORDER — HYDROMORPHONE HCL 1 MG/ML IJ SOLN
0.2500 mg | INTRAMUSCULAR | Status: DC | PRN
  Administered 2014-09-24 (×4): 0.5 mg via INTRAVENOUS

## 2014-09-24 MED ORDER — CIPROFLOXACIN IN D5W 400 MG/200ML IV SOLN
INTRAVENOUS | Status: AC
  Filled 2014-09-24: qty 200

## 2014-09-24 MED ORDER — HEPARIN SODIUM (PORCINE) 5000 UNIT/ML IJ SOLN
5000.0000 [IU] | INTRAMUSCULAR | Status: AC
  Administered 2014-09-24: 5000 [IU] via SUBCUTANEOUS
  Filled 2014-09-24: qty 1

## 2014-09-24 MED ORDER — PROPOFOL 10 MG/ML IV BOLUS
INTRAVENOUS | Status: AC
  Filled 2014-09-24: qty 20

## 2014-09-24 MED ORDER — LACTATED RINGERS IV SOLN
INTRAVENOUS | Status: DC | PRN
  Administered 2014-09-24: 07:00:00 via INTRAVENOUS

## 2014-09-24 MED ORDER — MIDAZOLAM HCL 5 MG/5ML IJ SOLN
INTRAMUSCULAR | Status: DC | PRN
  Administered 2014-09-24: 2 mg via INTRAVENOUS

## 2014-09-24 MED ORDER — BUPIVACAINE LIPOSOME 1.3 % IJ SUSP
20.0000 mL | Freq: Once | INTRAMUSCULAR | Status: AC
  Administered 2014-09-24: 20 mL
  Filled 2014-09-24: qty 20

## 2014-09-24 MED ORDER — KCL IN DEXTROSE-NACL 20-5-0.45 MEQ/L-%-% IV SOLN
INTRAVENOUS | Status: DC
  Administered 2014-09-24 – 2014-09-25 (×3): via INTRAVENOUS
  Administered 2014-09-26: 1000 mL via INTRAVENOUS
  Filled 2014-09-24 (×6): qty 1000

## 2014-09-24 MED ORDER — NEOSTIGMINE METHYLSULFATE 10 MG/10ML IV SOLN
INTRAVENOUS | Status: DC | PRN
  Administered 2014-09-24: 5 mg via INTRAVENOUS

## 2014-09-24 MED ORDER — DEXAMETHASONE SODIUM PHOSPHATE 10 MG/ML IJ SOLN
INTRAMUSCULAR | Status: DC | PRN
  Administered 2014-09-24: 10 mg via INTRAVENOUS

## 2014-09-24 MED ORDER — ONDANSETRON HCL 4 MG/2ML IJ SOLN
4.0000 mg | INTRAMUSCULAR | Status: DC | PRN
  Administered 2014-09-25 (×4): 4 mg via INTRAVENOUS
  Filled 2014-09-24 (×4): qty 2

## 2014-09-24 MED ORDER — ACETAMINOPHEN 160 MG/5ML PO SOLN: 325.0000 mg | ORAL | Status: DC | PRN

## 2014-09-24 MED ORDER — ONDANSETRON HCL 4 MG/2ML IJ SOLN
INTRAMUSCULAR | Status: AC
  Filled 2014-09-24: qty 2

## 2014-09-24 MED ORDER — FENTANYL CITRATE (PF) 250 MCG/5ML IJ SOLN
INTRAMUSCULAR | Status: AC
  Filled 2014-09-24: qty 5

## 2014-09-24 MED ORDER — PROMETHAZINE HCL 25 MG/ML IJ SOLN
INTRAMUSCULAR | Status: AC
  Filled 2014-09-24: qty 1

## 2014-09-24 MED ORDER — CEFOXITIN SODIUM 2 G IV SOLR: 2.0000 g | INTRAVENOUS | Status: DC

## 2014-09-24 MED ORDER — SODIUM CHLORIDE 0.9 % IJ SOLN
INTRAMUSCULAR | Status: DC | PRN
  Administered 2014-09-24: 10 mL

## 2014-09-24 MED ORDER — HEPARIN SODIUM (PORCINE) 5000 UNIT/ML IJ SOLN
5000.0000 [IU] | Freq: Three times a day (TID) | INTRAMUSCULAR | Status: DC
Start: 2014-09-24 — End: 2014-09-26
  Administered 2014-09-24 – 2014-09-26 (×6): 5000 [IU] via SUBCUTANEOUS
  Filled 2014-09-24 (×9): qty 1

## 2014-09-24 MED ORDER — MEPERIDINE HCL 50 MG/ML IJ SOLN: 6.2500 mg | INTRAMUSCULAR | Status: DC | PRN

## 2014-09-24 MED ORDER — INSULIN ASPART 100 UNIT/ML ~~LOC~~ SOLN
0.0000 [IU] | SUBCUTANEOUS | Status: DC
  Administered 2014-09-24: 4 [IU] via SUBCUTANEOUS
  Administered 2014-09-24 (×3): 7 [IU] via SUBCUTANEOUS
  Administered 2014-09-25 (×2): 4 [IU] via SUBCUTANEOUS
  Administered 2014-09-25 (×2): 3 [IU] via SUBCUTANEOUS

## 2014-09-24 MED ORDER — EPHEDRINE SULFATE 50 MG/ML IJ SOLN
INTRAMUSCULAR | Status: AC
  Filled 2014-09-24: qty 1

## 2014-09-24 MED ORDER — SODIUM CHLORIDE 0.9 % IJ SOLN
INTRAMUSCULAR | Status: AC
  Filled 2014-09-24: qty 10

## 2014-09-24 MED ORDER — UNJURY VANILLA POWDER
2.0000 [oz_av] | Freq: Four times a day (QID) | ORAL | Status: DC
  Administered 2014-09-25: 2 [oz_av] via ORAL

## 2014-09-24 MED ORDER — PHENYLEPHRINE 40 MCG/ML (10ML) SYRINGE FOR IV PUSH (FOR BLOOD PRESSURE SUPPORT)
PREFILLED_SYRINGE | INTRAVENOUS | Status: AC
  Filled 2014-09-24: qty 10

## 2014-09-24 MED ORDER — UNJURY CHOCOLATE CLASSIC POWDER
2.0000 [oz_av] | Freq: Four times a day (QID) | ORAL | Status: DC
  Administered 2014-09-26: 2 [oz_av] via ORAL

## 2014-09-24 MED ORDER — ONDANSETRON HCL 4 MG/2ML IJ SOLN
INTRAMUSCULAR | Status: DC | PRN
  Administered 2014-09-24: 4 mg via INTRAVENOUS

## 2014-09-24 MED ORDER — MIDAZOLAM HCL 2 MG/2ML IJ SOLN
INTRAMUSCULAR | Status: AC
  Filled 2014-09-24: qty 2

## 2014-09-24 MED ORDER — LACTATED RINGERS IR SOLN
Status: DC | PRN
  Administered 2014-09-24: 1

## 2014-09-24 MED ORDER — FENTANYL CITRATE (PF) 100 MCG/2ML IJ SOLN
INTRAMUSCULAR | Status: DC | PRN
  Administered 2014-09-24 (×5): 50 ug via INTRAVENOUS

## 2014-09-24 MED ORDER — ROCURONIUM BROMIDE 100 MG/10ML IV SOLN
INTRAVENOUS | Status: DC | PRN
  Administered 2014-09-24: 40 mg via INTRAVENOUS
  Administered 2014-09-24: 20 mg via INTRAVENOUS
  Administered 2014-09-24 (×2): 10 mg via INTRAVENOUS

## 2014-09-24 MED ORDER — LIDOCAINE HCL (CARDIAC) 20 MG/ML IV SOLN
INTRAVENOUS | Status: DC | PRN
  Administered 2014-09-24: 100 mg via INTRAVENOUS

## 2014-09-24 MED ORDER — ROCURONIUM BROMIDE 100 MG/10ML IV SOLN
INTRAVENOUS | Status: AC
  Filled 2014-09-24: qty 1

## 2014-09-24 MED ORDER — ACETAMINOPHEN 160 MG/5ML PO SOLN
650.0000 mg | ORAL | Status: DC | PRN
  Administered 2014-09-26: 650 mg via ORAL
  Filled 2014-09-24: qty 20.3

## 2014-09-24 MED ORDER — PHENYLEPHRINE HCL 10 MG/ML IJ SOLN
INTRAMUSCULAR | Status: DC | PRN
  Administered 2014-09-24: 40 ug via INTRAVENOUS
  Administered 2014-09-24: 80 ug via INTRAVENOUS

## 2014-09-24 MED ORDER — HYDROMORPHONE HCL 1 MG/ML IJ SOLN
INTRAMUSCULAR | Status: AC
  Filled 2014-09-24: qty 1

## 2014-09-24 MED ORDER — SUCCINYLCHOLINE CHLORIDE 20 MG/ML IJ SOLN
INTRAMUSCULAR | Status: DC | PRN
  Administered 2014-09-24: 120 mg via INTRAVENOUS

## 2014-09-24 MED ORDER — OXYCODONE HCL 5 MG/5ML PO SOLN
5.0000 mg | ORAL | Status: DC | PRN
  Administered 2014-09-25 – 2014-09-26 (×2): 5 mg via ORAL
  Filled 2014-09-24 (×3): qty 5

## 2014-09-24 MED ORDER — 0.9 % SODIUM CHLORIDE (POUR BTL) OPTIME
TOPICAL | Status: DC | PRN
  Administered 2014-09-24: 1000 mL

## 2014-09-24 MED ORDER — LIDOCAINE HCL (CARDIAC) 20 MG/ML IV SOLN
INTRAVENOUS | Status: AC
  Filled 2014-09-24: qty 5

## 2014-09-24 MED ORDER — PROMETHAZINE HCL 25 MG/ML IJ SOLN
6.2500 mg | INTRAMUSCULAR | Status: DC | PRN
  Administered 2014-09-24: 6.25 mg via INTRAVENOUS

## 2014-09-24 SURGICAL SUPPLY — 64 items
APL SRG 32X5 SNPLK LF DISP (MISCELLANEOUS)
APPLICATOR COTTON TIP 6IN STRL (MISCELLANEOUS) IMPLANT
APPLIER CLIP 5 13 M/L LIGAMAX5 (MISCELLANEOUS)
APPLIER CLIP ROT 10 11.4 M/L (STAPLE)
APPLIER CLIP ROT 13.4 12 LRG (CLIP)
APR CLP LRG 13.4X12 ROT 20 MLT (CLIP)
APR CLP MED LRG 11.4X10 (STAPLE)
APR CLP MED LRG 5 ANG JAW (MISCELLANEOUS)
BLADE SURG 15 STRL LF DISP TIS (BLADE) ×1 IMPLANT
BLADE SURG 15 STRL SS (BLADE) ×2
CABLE HIGH FREQUENCY MONO STRZ (ELECTRODE) ×1 IMPLANT
CLIP APPLIE 5 13 M/L LIGAMAX5 (MISCELLANEOUS) IMPLANT
CLIP APPLIE ROT 10 11.4 M/L (STAPLE) IMPLANT
CLIP APPLIE ROT 13.4 12 LRG (CLIP) IMPLANT
DEVICE SUT QUICK LOAD TK 5 (STAPLE) IMPLANT
DEVICE SUT TI-KNOT TK 5X26 (MISCELLANEOUS) IMPLANT
DEVICE SUTURE ENDOST 10MM (ENDOMECHANICALS) IMPLANT
DEVICE TROCAR PUNCTURE CLOSURE (ENDOMECHANICALS) ×2 IMPLANT
DISSECTOR BLUNT TIP ENDO 5MM (MISCELLANEOUS) ×2 IMPLANT
DRAPE CAMERA CLOSED 9X96 (DRAPES) ×2 IMPLANT
ELECT REM PT RETURN 9FT ADLT (ELECTROSURGICAL) ×2
ELECTRODE REM PT RTRN 9FT ADLT (ELECTROSURGICAL) ×1 IMPLANT
GAUZE SPONGE 4X4 12PLY STRL (GAUZE/BANDAGES/DRESSINGS) IMPLANT
GLOVE BIOGEL M 8.0 STRL (GLOVE) ×2 IMPLANT
GOWN STRL REUS W/TWL XL LVL3 (GOWN DISPOSABLE) ×8 IMPLANT
HANDLE STAPLE EGIA 4 XL (STAPLE) ×2 IMPLANT
HOVERMATT SINGLE USE (MISCELLANEOUS) ×2 IMPLANT
KIT BASIN OR (CUSTOM PROCEDURE TRAY) ×2 IMPLANT
LIQUID BAND (GAUZE/BANDAGES/DRESSINGS) IMPLANT
NDL SPNL 22GX3.5 QUINCKE BK (NEEDLE) ×1 IMPLANT
NEEDLE SPNL 22GX3.5 QUINCKE BK (NEEDLE) ×2 IMPLANT
PACK UNIVERSAL I (CUSTOM PROCEDURE TRAY) ×2 IMPLANT
PEN SKIN MARKING BROAD (MISCELLANEOUS) ×2 IMPLANT
RELOAD STAPLE 45 PURP MED/THCK (STAPLE) IMPLANT
RELOAD TRI 45 ART MED THCK BLK (STAPLE) ×2 IMPLANT
RELOAD TRI 45 ART MED THCK PUR (STAPLE) IMPLANT
RELOAD TRI 60 ART MED THCK BLK (STAPLE) ×2 IMPLANT
RELOAD TRI 60 ART MED THCK PUR (STAPLE) ×4 IMPLANT
SCISSORS LAP 5X45 EPIX DISP (ENDOMECHANICALS) ×1 IMPLANT
SCRUB PCMX 4 OZ (MISCELLANEOUS) ×3 IMPLANT
SEALANT SURGICAL APPL DUAL CAN (MISCELLANEOUS) IMPLANT
SET IRRIG TUBING LAPAROSCOPIC (IRRIGATION / IRRIGATOR) ×2 IMPLANT
SHEARS CURVED HARMONIC AC 45CM (MISCELLANEOUS) ×2 IMPLANT
SLEEVE ADV FIXATION 5X100MM (TROCAR) ×4 IMPLANT
SLEEVE GASTRECTOMY 36FR VISIGI (MISCELLANEOUS) ×2 IMPLANT
SOLUTION ANTI FOG 6CC (MISCELLANEOUS) ×1 IMPLANT
SPONGE LAP 18X18 X RAY DECT (DISPOSABLE) ×2 IMPLANT
STAPLER VISISTAT 35W (STAPLE) ×2 IMPLANT
SUT SURGIDAC NAB ES-9 0 48 120 (SUTURE) IMPLANT
SUT VIC AB 4-0 SH 18 (SUTURE) ×2 IMPLANT
SUT VICRYL 0 TIES 12 18 (SUTURE) ×2 IMPLANT
SYR 20CC LL (SYRINGE) ×2 IMPLANT
SYR 50ML LL SCALE MARK (SYRINGE) ×2 IMPLANT
TOWEL OR 17X26 10 PK STRL BLUE (TOWEL DISPOSABLE) ×4 IMPLANT
TOWEL OR NON WOVEN STRL DISP B (DISPOSABLE) ×2 IMPLANT
TRAY FOLEY W/METER SILVER 14FR (SET/KITS/TRAYS/PACK) IMPLANT
TROCAR ADV FIXATION 12X100MM (TROCAR) ×2 IMPLANT
TROCAR ADV FIXATION 5X100MM (TROCAR) ×2 IMPLANT
TROCAR BLADELESS 15MM (ENDOMECHANICALS) ×2 IMPLANT
TROCAR BLADELESS OPT 5 100 (ENDOMECHANICALS) ×2 IMPLANT
TUBE CALIBRATION LAPBAND (TUBING) ×1 IMPLANT
TUBING CONNECTING 10 (TUBING) ×2 IMPLANT
TUBING ENDO SMARTCAP (MISCELLANEOUS) ×2 IMPLANT
TUBING FILTER THERMOFLATOR (ELECTROSURGICAL) ×2 IMPLANT

## 2014-09-24 NOTE — Interval H&P Note (Signed)
History and Physical Interval Note:  09/24/2014 6:54 AM  Alice Simpson  has presented today for surgery, with the diagnosis of MORBID OBESITY  The various methods of treatment have been discussed with the patient and family. After consideration of risks, benefits and other options for treatment, the patient has consented to  Procedure(s): LAPAROSCOPIC GASTRIC SLEEVE RESECTION (N/A) as a surgical intervention .  The patient's history has been reviewed, patient examined, no change in status, stable for surgery.  I have reviewed the patient's chart and labs.  Questions were answered to the patient's satisfaction.     [place] Huberty B

## 2014-09-24 NOTE — Op Note (Signed)
Surgeon: Kaylyn Lim, MD, FACS  Asst:  Alphonsa Overall, MD, FACS  Anes:  General endotracheal  Procedure: Laparoscopic sleeve gastrectomy and upper endoscopy  Diagnosis: Morbid obesity  Complications: none  EBL:   14 cc  Description of Procedure:  The patient was take to OR 11 and given general anesthesia.  The abdomen was prepped with PCMX and draped sterilely.  A timeout was performed.  Access to the abdomen was achieved with a 5 mm Optiview through the left upper quadrant without difficulty.  Following insufflation, the state of the abdomen was found to be free of adhesions.  UGI was normal and no history of GER.  Questionalbe dimple and balloon test was negative.  The ViSiGi 36Fr tube was inserted to deflate the stomach and was pulled back into the esophagus.    The pylorus was identified and we measured 5 cm back and marked the antrum.  At that point we began dissection to take down the greater curvature of the stomach using the Harmonic scalpel.  This dissection was taken all the way up to the left crus.  Posterior attachments of the stomach were also taken down.    The ViSiGi tube was then passed into the antrum and suction applied so that it was snug along the lessor curvature.  The "crow's foot" or incisura was identified.  The sleeve gastrectomy was begun using the [employer] stapler beginning with a 4.5 cm black load with TRS.  This was followed by a 6 cm black load with TRS.  This was followed by multiple applications of the 6 cm purple load with tRS.  Marland Kitchen  When the sleeve was complete the tube was taken off suction and insufflated briefly.  The tube was withdrawn.  Upper endoscopy was then performed by Dr. Lucia Gaskins and no bleeding or leaks were noted.  Some food was seen suggesting poor emptying.     The specimen was extracted through the 15 trocar site.  Wounds were infiltrated with Exparel and the 15 closed with then Endoclose and a 0 vicryl  and closed with 4-0 vicryl and  Liquiban.    Matt B. Hassell Done, [place], [facility], [place]

## 2014-09-24 NOTE — H&P (View-Only) (Signed)
Chief Complaint:  For sleeve gastrectomy   History of Present Illness:  Alice Simpson is an 49 y.o. female who presents on April 20th for her preop for sleeve gastrectomy.  Informed consent and questions answered.  Her primary is [name], MD.  She has type II DM and a lifelong history of obesity.  She has severe hip pain and will be receiving an injection next week.  Her dependence on NSAIDS is a main reason to have a sleeve instead of a bypass.  She takes a low dose BCP for PCOS and I told her to stay on this since she feels bad when off of it.    Past Medical History  Diagnosis Date  . Diabetes mellitus without complication   . Hyperlipidemia   . Depression   . Anxiety   . Migraine   . Osteoarthritis   . Neuropathy   . PCOS (polycystic ovarian syndrome)     Past Surgical History  Procedure Laterality Date  . Cesarean section    . Wisdom    . Wisdom tooth extraction    . Bunionectomy      Current Outpatient Prescriptions  Medication Sig Dispense Refill  . ALPRAZolam (XANAX) 0.5 MG tablet Take 0.5 mg by mouth as needed.     Marland Kitchen buPROPion (WELLBUTRIN XL) 150 MG 24 hr tablet 150 mg.    . calcium carbonate (TUMS - DOSED IN MG ELEMENTAL CALCIUM) 500 MG chewable tablet Chew 1 tablet by mouth daily.    . cyanocobalamin 500 MCG tablet Take 500 mcg by mouth daily.    Marland Kitchen FLUoxetine (PROZAC) 40 MG capsule Take 40 mg by mouth daily.     . Gabapentin, PHN, 300 MG TABS Increase as instructed up to 1 tablet (300 mg total) by mouth 3 times daily.    Marland Kitchen glipiZIDE (GLUCOTROL) 10 MG tablet Take 1 tablet (10 mg total) by mouth daily before breakfast. 30 tablet 1  . HYDROcodone-acetaminophen (VICODIN) 5-500 MG per tablet Take 1 tablet by mouth every 6 (six) hours as needed.     Marland Kitchen losartan (COZAAR) 25 MG tablet Take 25 mg by mouth daily.     . Meclizine HCl (TRAVEL SICKNESS) 25 MG CHEW 25 mg.    . metaxalone (SKELAXIN) 800 MG tablet Take 800 mg by mouth as needed.     . metFORMIN (GLUCOPHAGE-XR)  500 MG 24 hr tablet Take 4 tablets (2,000 mg total) by mouth daily. 120 tablet 1  . Multiple Vitamin (MULTI-VITAMINS) TABS 1 tablet.    . norethindrone (MICRONOR,CAMILA,ERRIN) 0.35 MG tablet Take 1 tablet (0.35 mg total) by mouth daily. 1 Package 11  . ondansetron (ZOFRAN) 4 MG tablet Take 4 mg by mouth as needed.     . simvastatin (ZOCOR) 40 MG tablet Take 40 mg by mouth daily at 6 PM.     . sitaGLIPtin (JANUVIA) 100 MG tablet Take 1 tablet (100 mg total) by mouth daily. 30 tablet 1  . traMADol (ULTRAM) 50 MG tablet Take by mouth every 6 (six) hours as needed.    . traZODone (DESYREL) 50 MG tablet Take 50 mg by mouth at bedtime.      No current facility-administered medications for this visit.   Penicillin g and Prochlorperazine Family History  Problem Relation Age of Onset  . Diabetes Other   . Depression Other   . Anxiety disorder Other    Social History:   reports that she quit smoking about 8 years ago. Her smoking use included  Cigarettes. She has never used smokeless tobacco. Her alcohol and drug histories are not on file.   REVIEW OF SYSTEMS : Negative except for see problem list  Physical Exam:   There were no vitals taken for this visit. There is no weight on file to calculate BMI.  Gen:  WDWN WF NAD  Neurological: Alert and oriented to person, place, and time. Motor and sensory function is grossly intact  Head: Normocephalic and atraumatic.  Eyes: Conjunctivae are normal. Pupils are equal, round, and reactive to light. No scleral icterus.  Neck: Normal range of motion. Neck supple. No tracheal deviation or thyromegaly present.  Cardiovascular:  SR without murmurs or gallops.  No carotid bruits Breast:  Not examined Respiratory: Effort normal.  No respiratory distress. No chest wall tenderness. Breath sounds normal.  No wheezes, rales or rhonchi.  Abdomen:  Obese and nontender GU:  Not examined today Musculoskeletal: Normal range of motion. Extremities are nontender. No  cyanosis, edema or clubbing noted Lymphadenopathy: No cervical, preauricular, postauricular or axillary adenopathy is present Skin: Skin is warm and dry. No rash noted. No diaphoresis. No erythema. No pallor. Pscyh: Normal mood and affect. Behavior is normal. Judgment and thought content normal.   LABORATORY RESULTS: No results found for this or any previous visit (from the past 48 hour(s)).   RADIOLOGY RESULTS: No results found.  Problem List: Patient Active Problem List   Diagnosis Date Noted  . PCOS (polycystic ovarian syndrome) 08/02/2013  . Depressive disorder 05/26/2013  . Type II or unspecified type diabetes mellitus without mention of complication, uncontrolled 05/26/2013  . Hyperlipidemia 05/26/2013  . Low back pain 05/26/2013  . Migraine 05/26/2013    Assessment & Plan: Morbid obesity with type II DM and ostearthritis of the hip for lap sleeve gastrectomy    Matt B. Hassell Done, MD, [facility] [phone] beeper [phone]  09/05/2014 11:33 AM

## 2014-09-24 NOTE — Anesthesia Procedure Notes (Signed)
Procedure Name: Intubation Date/Time: 09/24/2014 7:21 AM Performed by: Carleene Cooper A Pre-anesthesia Checklist: Patient identified, Timeout performed, Emergency Drugs available, Suction available and Patient being monitored Patient Re-evaluated:Patient Re-evaluated prior to inductionOxygen Delivery Method: Circle system utilized Preoxygenation: Pre-oxygenation with 100% oxygen Intubation Type: IV induction Ventilation: Mask ventilation without difficulty Laryngoscope Size: Mac and 4 Grade View: Grade I Tube type: Oral Number of attempts: 1 Airway Equipment and Method: Stylet Placement Confirmation: ETT inserted through vocal cords under direct vision,  breath sounds checked- equal and bilateral and positive ETCO2 Secured at: 22 cm Tube secured with: Tape Dental Injury: Teeth and Oropharynx as per pre-operative assessment

## 2014-09-24 NOTE — Op Note (Signed)
Name:  Alice Simpson MRN: [id number] Date of Surgery: 09/24/2014  Preop Diagnosis:  Morbid Obesity  Postop Diagnosis:  Morbid Obesity (Weight - 360, BMI - 54.7) , S/P Gastric Sleeve  Procedure:  Upper endoscopy  (Intraoperative)  Surgeon:  Alphonsa Overall, M.D.  Anesthesia:  GET  Indications for procedure: Alice Simpson is a 49 y.o. female whose primary care physician is Marjorie Smolder, MD and has completed a Gastric Sleeve today by Dr. Hassell Done.  I am doing an intraoperative upper endoscopy to evaluate the gastric pouch.  Operative Note: The patient is under general anesthesia.  Dr. Hassell Done is laparoscoping the patient while I do an upper endoscopy to evaluate the stomach pouch.  With the patient intubated, I passed the Pentax upper endoscope without difficulty down the esophagus.  The esophago-gastric junction was at 40 cm.    The mucosa of the stomach looked viable and the staple line was intact without bleeding.  I advanced to the pylorus, but did not go through it.  While I insufflated the stomach pouch with air, Dr. Hassell Done  flooded the upper abdomen with saline to put the gastric pouch under saline.  There was no bubbling or evidence of a leak. There was some food stuff in the stapled stomach. Photos were taken of the gastric pouch.  There was no evidence of narrowing of the pouch and the gastric sleeve looked tubular.  The scope was then withdrawn.  The esophagus was unremarkable and the patient tolerated the endoscopy without difficulty.  Alphonsa Overall, MD, [facility] Pager: [phone] Office phone:  [phone]

## 2014-09-24 NOTE — Anesthesia Postprocedure Evaluation (Signed)
Anesthesia Post Note  Patient: Alice Simpson  Procedure(s) Performed: Procedure(s) (LRB): LAPAROSCOPIC GASTRIC SLEEVE RESECTION upper endoscopy (N/A)  Anesthesia type: General  Patient location: PACU  Post pain: Pain level controlled  Post assessment: Post-op Vital signs reviewed  Last Vitals: BP 160/0 mmHg  Pulse 102  Temp(Src) 36.4 C (Oral)  Resp 14  Ht 5\' 8"  (1.727 m)  Wt 350 lb (158.759 kg)  BMI 53.23 kg/m2  SpO2 100%  Post vital signs: Reviewed  Level of consciousness: sedated  Complications: No apparent anesthesia complications

## 2014-09-24 NOTE — Transfer of Care (Signed)
Immediate Anesthesia Transfer of Care Note  Patient: Alice Simpson  Procedure(s) Performed: Procedure(s): LAPAROSCOPIC GASTRIC SLEEVE RESECTION upper endoscopy (N/A)  Patient Location: PACU  Anesthesia Type:General  Level of Consciousness: awake, alert , oriented and patient cooperative  Airway & Oxygen Therapy: Patient Spontanous Breathing and Patient connected to face mask oxygen  Post-op Assessment: Report given to RN, Post -op Vital signs reviewed and stable and Patient moving all extremities  Post vital signs: Reviewed and stable  Last Vitals:  Filed Vitals:   09/24/14 0549  BP: 145/65  Pulse: 105  Temp: 36.8 C  Resp: 18    Complications: No apparent anesthesia complications

## 2014-09-24 NOTE — Anesthesia Preprocedure Evaluation (Addendum)
Anesthesia Evaluation  Patient identified by MRN, date of birth, ID band Patient awake    Reviewed: Allergy & Precautions, NPO status , Patient's Chart, lab work & pertinent test results  Airway Mallampati: II  TM Distance: >3 FB Neck ROM: Full    Dental no notable dental hx.    Pulmonary neg pulmonary ROS, former smoker,  breath sounds clear to auscultation  Pulmonary exam normal       Cardiovascular hypertension, Pt. on medications negative cardio ROS Normal cardiovascular examRhythm:Regular Rate:Normal     Neuro/Psych  Headaches, PSYCHIATRIC DISORDERS Anxiety Depression    GI/Hepatic negative GI ROS, Neg liver ROS,   Endo/Other  diabetes, Type 2, Oral Hypoglycemic AgentsMorbid obesity  Renal/GU negative Renal ROS     Musculoskeletal  (+) Arthritis -,   Abdominal (+) + obese,   Peds  Hematology negative hematology ROS (+)   Anesthesia Other Findings   Reproductive/Obstetrics negative OB ROS                            Anesthesia Physical Anesthesia Plan  ASA: III  Anesthesia Plan: General   Post-op Pain Management:    Induction: Intravenous  Airway Management Planned: Oral ETT  Additional Equipment: None  Intra-op Plan:   Post-operative Plan: Extubation in OR  Informed Consent: I have reviewed the patients History and Physical, chart, labs and discussed the procedure including the risks, benefits and alternatives for the proposed anesthesia with the patient or authorized representative who has indicated his/her understanding and acceptance.   Dental advisory given  Plan Discussed with: CRNA  Anesthesia Plan Comments:         Anesthesia Quick Evaluation

## 2014-09-24 NOTE — Progress Notes (Signed)
Patient alert and oriented, op day.  Provided support and encouragement.  Encouraged pulmonary toilet and  Ambulation.  All questions answered.  Will continue to monitor.

## 2014-09-25 ENCOUNTER — Encounter (HOSPITAL_COMMUNITY): Payer: Self-pay | Admitting: Surgery

## 2014-09-25 ENCOUNTER — Inpatient Hospital Stay (HOSPITAL_COMMUNITY): Payer: Medicaid Other

## 2014-09-25 LAB — CBC WITH DIFFERENTIAL/PLATELET
BASOS PCT: 0 % (ref 0–1)
Basophils Absolute: 0 10*3/uL (ref 0.0–0.1)
EOS PCT: 0 % (ref 0–5)
Eosinophils Absolute: 0 10*3/uL (ref 0.0–0.7)
HEMATOCRIT: 39 % (ref 36.0–46.0)
HEMOGLOBIN: 12.7 g/dL (ref 12.0–15.0)
Lymphocytes Relative: 13 % (ref 12–46)
Lymphs Abs: 2.5 10*3/uL (ref 0.7–4.0)
MCH: 26.6 pg (ref 26.0–34.0)
MCHC: 32.6 g/dL (ref 30.0–36.0)
MCV: 81.6 fL (ref 78.0–100.0)
MONO ABS: 1.5 10*3/uL — AB (ref 0.1–1.0)
Monocytes Relative: 8 % (ref 3–12)
Neutro Abs: 14.4 10*3/uL — ABNORMAL HIGH (ref 1.7–7.7)
Neutrophils Relative %: 79 % — ABNORMAL HIGH (ref 43–77)
Platelets: 358 10*3/uL (ref 150–400)
RBC: 4.78 MIL/uL (ref 3.87–5.11)
RDW: 12.8 % (ref 11.5–15.5)
WBC: 18.4 10*3/uL — ABNORMAL HIGH (ref 4.0–10.5)

## 2014-09-25 LAB — GLUCOSE, CAPILLARY
GLUCOSE-CAPILLARY: 175 mg/dL — AB (ref 70–99)
Glucose-Capillary: 181 mg/dL — ABNORMAL HIGH (ref 70–99)
Glucose-Capillary: 147 mg/dL — ABNORMAL HIGH (ref 70–99)
Glucose-Capillary: 140 mg/dL — ABNORMAL HIGH (ref 70–99)
Glucose-Capillary: 116 mg/dL — ABNORMAL HIGH (ref 70–99)
Glucose-Capillary: 140 mg/dL — ABNORMAL HIGH (ref 70–99)

## 2014-09-25 LAB — HEMOGLOBIN A1C
HEMOGLOBIN A1C: 6.9 % — AB (ref 4.8–5.6)
MEAN PLASMA GLUCOSE: 151 mg/dL

## 2014-09-25 MED ORDER — ALUM & MAG HYDROXIDE-SIMETH 200-200-20 MG/5ML PO SUSP
30.0000 mL | Freq: Four times a day (QID) | ORAL | Status: DC | PRN
  Administered 2014-09-25: 30 mL via ORAL
  Filled 2014-09-25: qty 30

## 2014-09-25 MED ORDER — PANTOPRAZOLE SODIUM 40 MG IV SOLR
40.0000 mg | Freq: Every day | INTRAVENOUS | Status: DC
  Administered 2014-09-25: 40 mg via INTRAVENOUS
  Filled 2014-09-25 (×2): qty 40

## 2014-09-25 MED ORDER — IOHEXOL 300 MG/ML  SOLN
50.0000 mL | Freq: Once | INTRAMUSCULAR | Status: AC | PRN
Start: 2014-09-25 — End: 2014-09-25
  Administered 2014-09-25: 40 mL via ORAL

## 2014-09-25 NOTE — Care Management Note (Signed)
Case Management Note  Patient Details  Name: Alice Simpson MRN: [id number] Date of Birth: Oct 27, 1965  Subjective/Objective:                 Admitted s/p lap sleeve gastrectomy  Action/Plan: Discharge planning  Expected Discharge Date:                  Expected Discharge Plan:  Home/Self Care  In-House Referral:  Nutrition  Discharge planning Services  CM Consult  Status of Service:  Completed, signed off  If discussed at Long Length of Stay Meetings, dates discussed:    Additional Comments:  Guadalupe Maple, RN 09/25/2014, 8:59 AM

## 2014-09-25 NOTE — Progress Notes (Addendum)
Patient alert and oriented, Post op day 1.  Provided support and encouragement.  Encouraged pulmonary toilet, ambulation and small sips of liquids when ordered received to advance diet..  All questions answered.  Will continue to monitor.  Patient seen and UGI reviewed.  Doing well on PD 1 and diet advanced.  Plan discharge tomorrow  Matt B. Hassell Done, MD, [facility] [phone] beeper [phone]  09/26/2014 7:57 AM

## 2014-09-25 NOTE — Plan of Care (Signed)
Problem: Food- and Nutrition-Related Knowledge Deficit (NB-1.1) Goal: Nutrition education Formal process to instruct or train a patient/client in a skill or to impart knowledge to help patients/clients voluntarily manage or modify food choices and eating behavior to maintain or improve health. Outcome: Completed/Met Date Met:  09/25/14 Nutrition Education Note  Received consult for diet education per DROP protocol.   Discussed 2 week post op diet with pt. Emphasized that liquids must be non carbonated, non caffeinated, and sugar free. Fluid goals discussed. Pt to follow up with outpatient bariatric RD for further diet progression after 2 weeks. Multivitamins and minerals also reviewed. Teach back method used, pt expressed understanding, expect good compliance.   Diet: First 2 Weeks  You will see the nutritionist about two (2) weeks after your surgery. The nutritionist will increase the types of foods you can eat if you are handling liquids well:  If you have severe vomiting or nausea and cannot handle clear liquids lasting longer than 1 day, call your surgeon  Protein Shake  Drink at least 2 ounces of shake 5-6 times per day  Each serving of protein shakes (usually 8 - 12 ounces) should have a minimum of:  15 grams of protein  And no more than 5 grams of carbohydrate  Goal for protein each day:  Men = 80 grams per day  Women = 60 grams per day  Protein powder may be added to fluids such as non-fat milk or Lactaid milk or Soy milk (limit to 35 grams added protein powder per serving)   Hydration  Slowly increase the amount of water and other clear liquids as tolerated (See Acceptable Fluids)  Slowly increase the amount of protein shake as tolerated  Sip fluids slowly and throughout the day  May use sugar substitutes in small amounts (no more than 6 - 8 packets per day; i.e. Splenda)   Fluid Goal  The first goal is to drink at least 8 ounces of protein shake/drink per day (or as directed  by the nutritionist); some examples of protein shakes are [employer], [employer], EAS Edge HP, and Unjury. See handout from pre-op Bariatric Education Class:  Slowly increase the amount of protein shake you drink as tolerated  You may find it easier to slowly sip shakes throughout the day  It is important to get your proteins in first  Your fluid goal is to drink 64 - 100 ounces of fluid daily  It may take a few weeks to build up to this  32 oz (or more) should be clear liquids  And  32 oz (or more) should be full liquids (see below for examples)  Liquids should not contain sugar, caffeine, or carbonation   Clear Liquids:  Water or Sugar-free flavored water (i.e. Fruit H2O, Propel)  Decaffeinated coffee or tea (sugar-free)  Crystal Lite, Wyler's Lite, Minute Maid Lite  Sugar-free Jell-O  Bouillon or broth  Sugar-free Popsicle: *Less than 20 calories each; Limit 1 per day   Full Liquids:  Protein Shakes/Drinks + 2 choices per day of other full liquids  Full liquids must be:  No More Than 12 grams of Carbs per serving  No More Than 3 grams of Fat per serving  Strained low-fat cream soup  Non-Fat milk  Fat-free Lactaid Milk  Sugar-free yogurt (Dannon Lite & Fit, Greek yogurt)

## 2014-09-26 ENCOUNTER — Telehealth: Payer: Self-pay | Admitting: Internal Medicine

## 2014-09-26 LAB — CBC WITH DIFFERENTIAL/PLATELET
BASOS PCT: 0 % (ref 0–1)
Basophils Absolute: 0 10*3/uL (ref 0.0–0.1)
EOS ABS: 0.2 10*3/uL (ref 0.0–0.7)
Eosinophils Relative: 1 % (ref 0–5)
HCT: 37.5 % (ref 36.0–46.0)
Hemoglobin: 12 g/dL (ref 12.0–15.0)
Lymphocytes Relative: 28 % (ref 12–46)
Lymphs Abs: 4.2 10*3/uL — ABNORMAL HIGH (ref 0.7–4.0)
MCH: 26.4 pg (ref 26.0–34.0)
MCHC: 32 g/dL (ref 30.0–36.0)
MCV: 82.4 fL (ref 78.0–100.0)
Monocytes Absolute: 1.4 10*3/uL — ABNORMAL HIGH (ref 0.1–1.0)
Monocytes Relative: 9 % (ref 3–12)
NEUTROS PCT: 62 % (ref 43–77)
Neutro Abs: 9.1 10*3/uL — ABNORMAL HIGH (ref 1.7–7.7)
Platelets: 327 10*3/uL (ref 150–400)
RBC: 4.55 MIL/uL (ref 3.87–5.11)
RDW: 13.1 % (ref 11.5–15.5)
WBC: 15 10*3/uL — ABNORMAL HIGH (ref 4.0–10.5)

## 2014-09-26 LAB — GLUCOSE, CAPILLARY
GLUCOSE-CAPILLARY: 148 mg/dL — AB (ref 70–99)
Glucose-Capillary: 156 mg/dL — ABNORMAL HIGH (ref 70–99)
Glucose-Capillary: 128 mg/dL — ABNORMAL HIGH (ref 70–99)

## 2014-09-26 MED ORDER — METFORMIN HCL ER 500 MG PO TB24: 500.0000 mg | ORAL_TABLET | Freq: Two times a day (BID) | ORAL | Status: DC

## 2014-09-26 NOTE — Plan of Care (Signed)
Problem: Phase II Progression Outcomes Goal: Pain controlled on oral analgesia Outcome: Not Met (add Reason) Pt stated oral med hurts her stomach

## 2014-09-26 NOTE — Discharge Summary (Signed)
Physician Discharge Summary  Patient ID: Alice Simpson MRN: [id number] DOB/AGE: February 01, 1966 49 y.o.  Admit date: 09/24/2014 Discharge date: 09/26/2014  Admission Diagnoses:  Morbid obesity  Discharge Diagnoses:  same  Active Problems:   S/P laparoscopic sleeve gastrectomy   Surgery: laparoscopic sleeve gastrectomy  Discharged Condition: improved  Hospital Course:   Had surgery.  PD 1 had ugi that looked ok.    Consults: none  Significant Diagnostic Studies: UGI    Discharge Exam: Blood pressure 130/69, pulse 98, temperature 98.4 F (36.9 C), temperature source Oral, resp. rate 18, height 5\' 8"  (1.727 m), weight 158.759 kg (350 lb), last menstrual period 09/05/2014, SpO2 93 %. Incisions ok  Disposition: Final discharge disposition not confirmed  Discharge Instructions    Ambulate hourly while awake    Complete by:  As directed      Call MD for:  difficulty breathing, headache or visual disturbances    Complete by:  As directed      Call MD for:  persistant dizziness or light-headedness    Complete by:  As directed      Call MD for:  persistant nausea and vomiting    Complete by:  As directed      Call MD for:  redness, tenderness, or signs of infection (pain, swelling, redness, odor or green/yellow discharge around incision site)    Complete by:  As directed      Call MD for:  severe uncontrolled pain    Complete by:  As directed      Call MD for:  temperature >101 F    Complete by:  As directed      Diet bariatric full liquid    Complete by:  As directed      Incentive spirometry    Complete by:  As directed   Perform hourly while awake     No dressing needed    Complete by:  As directed             Medication List    STOP taking these medications        HYDROcodone-acetaminophen 5-500 MG per tablet  Commonly known as:  VICODIN      TAKE these medications        acetaminophen 500 MG tablet  Commonly known as:  TYLENOL  Take 500 mg by mouth daily  as needed for moderate pain.     ALPRAZolam 0.5 MG tablet  Commonly known as:  XANAX  Take 0.5 mg by mouth daily as needed for anxiety.     buPROPion 150 MG 24 hr tablet  Commonly known as:  WELLBUTRIN XL  Take 150 mg by mouth every morning.     calcium carbonate 500 MG chewable tablet  Commonly known as:  TUMS - dosed in mg elemental calcium  Chew 1 tablet by mouth daily as needed for indigestion.     cyanocobalamin 500 MCG tablet  Take 500 mcg by mouth every morning.     diazepam 10 MG tablet  Commonly known as:  VALIUM  - Take 1 tablet by mouth. Take one the night before, the morning an hour before the procedure and then an extra to take when the procedure starts if pt. Feels she needs it.   - Before Cortisone Shots in doctors office.     FLUoxetine 40 MG capsule  Commonly known as:  PROZAC  Take 40 mg by mouth at bedtime.     LACTAID PO  Take 1 tablet by  mouth daily as needed (before eating d).     losartan 25 MG tablet  Commonly known as:  COZAAR  Take 25 mg by mouth at bedtime.     metaxalone 800 MG tablet  Commonly known as:  SKELAXIN  Take 800 mg by mouth 2 (two) times daily as needed for muscle spasms.     metFORMIN 500 MG 24 hr tablet  Commonly known as:  GLUCOPHAGE-XR  Take 4 tablets (2,000 mg total) by mouth daily.     Metformin HCl 500 MG/5ML Soln  Take 7.5 mLs (750 mg total) by mouth 3 (three) times daily.     MULTI-VITAMINS Tabs  Take 1 tablet by mouth every morning.     norethindrone 0.35 MG tablet  Commonly known as:  MICRONOR,CAMILA,ERRIN  Take 1 tablet (0.35 mg total) by mouth daily.     ondansetron 4 MG tablet  Commonly known as:  ZOFRAN  Take 4 mg by mouth daily as needed for nausea or vomiting.     PRESCRIPTION MEDICATION  Inject 1 each into the skin once. Cortisone Shots in Spine, SI joint, and Hip.     SF 5000 PLUS 1.1 % Crea dental cream  Generic drug:  sodium fluoride  Take 1 application by mouth 2 (two) times daily.      simvastatin 40 MG tablet  Commonly known as:  ZOCOR  Take 40 mg by mouth every evening.     sitaGLIPtin 100 MG tablet  Commonly known as:  JANUVIA  Take 1 tablet (100 mg total) by mouth daily.     TRAVEL SICKNESS 25 MG Chew  Generic drug:  Meclizine HCl  Chew 25 mg by mouth 2 (two) times daily as needed (dizziness).           Follow-up Information    Follow up with Pedro Earls, MD.   Specialty:  General Surgery   Contact information:   [place] [address] [phone]       Signed: Pedro Earls 09/26/2014, 8:07 AM

## 2014-09-26 NOTE — Progress Notes (Addendum)
Patient alert and oriented, pain is controlled. Patient is tolerating fluids, advanced to protein shake overnight, and patient tolerated well.  Reviewed Gastric sleeve discharge instructions with patient and patient is able to articulate understanding.  Provided information on BELT program, Support Group and [facility] outpatient pharmacy. All questions answered, will continue to monitor.   Patient's AVS has both Extended Release and Metformin Suspension, per patient, taking suspension.  Educated patient DB:[id number] of low blood sugar with reduced carbohydrate intake.  Requested patient to take CBG 3 times per day and keep a log for endocrinologist, patient to follow-up next week.  Provided options for snacks if CBG is low that fall in line with post-op diet.

## 2014-09-26 NOTE — Telephone Encounter (Signed)
Done

## 2014-09-26 NOTE — Discharge Instructions (Signed)

## 2014-09-26 NOTE — Telephone Encounter (Signed)
Patient stated that insurance will not approve of the liquid metformin, pharmacist stated that the non extended release metformin pill crushed will be ok, it would not hurt anything, please advise

## 2014-09-26 NOTE — Progress Notes (Signed)
Patient refused sliding scale insulin. Patient educated on the importance of insulin control and healing process with managing diabetes.

## 2014-09-26 NOTE — Telephone Encounter (Signed)
We can use 500 mg twice a day for now.

## 2014-09-26 NOTE — Telephone Encounter (Signed)
Please advised mg and dosage, I will send to pharmacy.

## 2014-09-26 NOTE — Telephone Encounter (Signed)
Please read message below and advise.  

## 2014-09-26 NOTE — Telephone Encounter (Signed)
That sounds good. She can definitely crush it.

## 2014-09-26 NOTE — Progress Notes (Signed)
Patient alert and oriented, pain is controlled through use of oral analgesia . Patient tolerated liquids and advanced to bariatric shakes prior to discharge and tolerated well. Discharge instructions reviewed with patient. All questions and concerns answered/addressed. Patient articulated understanding of all gastric sleeve/discharge instructions and who to notify if problems occur. Janifer Adie 09/26/2014. 12:32 PM

## 2014-09-26 NOTE — Progress Notes (Signed)
Patient alert and oriented, Post op day 2.  Provided support and encouragement.  Encouraged pulmonary toilet, ambulation and small sips of liquids.  All questions answered.  Will continue to monitor. 

## 2014-09-28 ENCOUNTER — Telehealth (HOSPITAL_COMMUNITY): Payer: Self-pay

## 2014-09-28 NOTE — Telephone Encounter (Signed)
Made discharge phone call to patient per DROP protocol. Asking the following questions.    1. Do you have someone to care for you now that you are home?  yes 2. Are you having pain now that is not relieved by your pain medication?  no 3. Are you able to drink the recommended daily amount of fluids (48 ounces minimum/day) and protein (60-80 grams/day) as prescribed by the dietitian or nutritional counselor?  Working on it -- it is hard though 4. Are you taking the vitamins and minerals as prescribed?  yes 5. Do you have the "on call" number to contact your surgeon if you have a problem or question?  yes 6. Are your incisions free of redness, swelling or drainage? (If steri strips, address that these can fall off, shower as tolerated) yes 7. Have your bowels moved since your surgery?  If not, are you passing gas?  No, yes 8. Are you up and walking 3-4 times per day?  yes    1. Do you have an appointment made to see your surgeon in the next month?  yes 2. Were you provided your discharge medications before your surgery or before you were discharged from the hospital and are you taking them without problem?  yes 3. Were you provided phone numbers to the clinic/surgeon's office?  yes 4. Did you watch the patient education video module in the (clinic, surgeon's office, etc.) before your surgery? yes 5. Do you have a discharge checklist that was provided to you in the hospital to reference with instructions on how to take care of yourself after surgery?  yes 6. Did you see a dietitian or nutritional counselor while you were in the hospital?  yes 7. Do you have an appointment to see a dietitian or nutritional counselor in the next month? yes

## 2014-10-09 ENCOUNTER — Encounter: Payer: Medicaid Other | Attending: Internal Medicine

## 2014-10-09 DIAGNOSIS — Z6841 Body Mass Index (BMI) 40.0 and over, adult: Secondary | ICD-10-CM | POA: Insufficient documentation

## 2014-10-09 DIAGNOSIS — Z713 Dietary counseling and surveillance: Secondary | ICD-10-CM | POA: Diagnosis not present

## 2014-10-09 NOTE — Progress Notes (Signed)
Bariatric Class:  Appt start time: 1530 end time:  1630.  2 Week Post-Operative Nutrition Class  Patient was seen on 10/09/2014 for Post-Operative Nutrition education at the [facility].   Surgery date: 09/24/2014 Surgery type: RYGB Start weight at [facility]: 363.5 on 02/20/14 Weight today: 331.5 lbs  Weight change: 28.5 lbs  TANITA  BODY COMP RESULTS  09/03/14 10/09/14   BMI (kg/m^2) 54.7 50.4   Fat Mass (lbs) 190.5 184.5   Fat Free Mass (lbs) 169.5 147.0   Total Body Water (lbs) 124 107.5    The following the learning objectives were met by the patient during this course:  Identifies Phase 3A (Soft, High Proteins) Dietary Goals and will begin from 2 weeks post-operatively to 2 months post-operatively  Identifies appropriate sources of fluids and proteins   States protein recommendations and appropriate sources post-operatively  Identifies the need for appropriate texture modifications, mastication, and bite sizes when consuming solids  Identifies appropriate multivitamin and calcium sources post-operatively  Describes the need for physical activity post-operatively and will follow MD recommendations  States when to call healthcare provider regarding medication questions or post-operative complications  Handouts given during class include:  Phase 3A: Soft, High Protein Diet Handout  Follow-Up Plan: Patient will follow-up at [facility] in 6 weeks for 2 month post-op nutrition visit for diet advancement per MD.

## 2014-10-16 ENCOUNTER — Telehealth: Payer: Self-pay | Admitting: Internal Medicine

## 2014-10-16 NOTE — Telephone Encounter (Signed)
If that was started only for kidney protection for diabetes and not for hypertension, we can stop it for now, but if she has a history of high blood pressure, we may not be able to stop.Marland KitchenMarland Kitchen

## 2014-10-16 NOTE — Telephone Encounter (Signed)
Please read message below. Pt had gastric sleeve surgery on 09/24/14. Please advise.

## 2014-10-16 NOTE — Telephone Encounter (Signed)
Patient stated that her b/s are between 27 to 124 ocasionaly 130's she take one metformin a day extended release, she is also taking Losartan  For kidney function, do she need to keep taking that. Please advise

## 2014-10-17 NOTE — Telephone Encounter (Signed)
He has, I would continue the metformin XR once a day, but we will need to stop if she starts having sugars lower than 90s. Please let us know.

## 2014-10-17 NOTE — Telephone Encounter (Signed)
Pt returned call. She was only taking the Losartan for kidney function.  She stated that her b/s has been between 90 and 124, some occasional 130's. Pt is only taking one metformin xr a day. Pt would like to know if she should continue on this dosage? Please advise.

## 2014-10-17 NOTE — Telephone Encounter (Signed)
Called pt and lvm advising her per Dr Arman Filter message. Advised pt to call with any questions.

## 2014-10-17 NOTE — Telephone Encounter (Signed)
Called pt and lvm advising her per Dr Arman Filter message. Advised pt to call and let us know what she is going to do.

## 2014-10-23 ENCOUNTER — Other Ambulatory Visit: Payer: Self-pay | Admitting: Orthopaedic Surgery

## 2014-11-08 ENCOUNTER — Encounter (HOSPITAL_COMMUNITY): Payer: Self-pay

## 2014-11-08 ENCOUNTER — Encounter (HOSPITAL_COMMUNITY)
Admission: RE | Admit: 2014-11-08 | Discharge: 2014-11-08 | Disposition: A | Discharge status: Home / Self Care | Payer: Medicaid Other | Source: Ambulatory Visit | Attending: Orthopaedic Surgery | Admitting: Orthopaedic Surgery

## 2014-11-08 DIAGNOSIS — M1611 Unilateral primary osteoarthritis, right hip: Secondary | ICD-10-CM | POA: Insufficient documentation

## 2014-11-08 DIAGNOSIS — Z01812 Encounter for preprocedural laboratory examination: Secondary | ICD-10-CM | POA: Diagnosis present

## 2014-11-08 LAB — BASIC METABOLIC PANEL
Anion gap: 10 (ref 5–15)
BUN: 11 mg/dL (ref 6–20)
CHLORIDE: 105 mmol/L (ref 101–111)
CO2: 23 mmol/L (ref 22–32)
Calcium: 9.5 mg/dL (ref 8.9–10.3)
Creatinine, Ser: 0.67 mg/dL (ref 0.44–1.00)
GFR calc Af Amer: >60 mL/min (ref >60)
GLUCOSE: 106 mg/dL — AB (ref 65–99)
Potassium: 4.1 mmol/L (ref 3.5–5.1)
Sodium: 138 mmol/L (ref 135–145)

## 2014-11-08 LAB — CBC WITH DIFFERENTIAL/PLATELET
Basophils Absolute: 0.1 10*3/uL (ref 0.0–0.1)
Basophils Relative: 1 % (ref 0–1)
EOS ABS: 0.2 10*3/uL (ref 0.0–0.7)
Eosinophils Relative: 2 % (ref 0–5)
HCT: 40.9 % (ref 36.0–46.0)
HEMOGLOBIN: 13.7 g/dL (ref 12.0–15.0)
LYMPHS ABS: 3 10*3/uL (ref 0.7–4.0)
LYMPHS PCT: 30 % (ref 12–46)
MCH: 26.9 pg (ref 26.0–34.0)
MCHC: 33.5 g/dL (ref 30.0–36.0)
MCV: 80.4 fL (ref 78.0–100.0)
MONOS PCT: 9 % (ref 3–12)
Monocytes Absolute: 0.9 10*3/uL (ref 0.1–1.0)
NEUTROS PCT: 58 % (ref 43–77)
Neutro Abs: 5.8 10*3/uL (ref 1.7–7.7)
Platelets: 355 10*3/uL (ref 150–400)
RBC: 5.09 MIL/uL (ref 3.87–5.11)
RDW: 14.1 % (ref 11.5–15.5)
WBC: 9.9 10*3/uL (ref 4.0–10.5)

## 2014-11-08 LAB — SURGICAL PCR SCREEN
MRSA, PCR: NEGATIVE
Staphylococcus aureus: NEGATIVE

## 2014-11-08 LAB — URINE MICROSCOPIC-ADD ON

## 2014-11-08 LAB — PROTIME-INR
INR: 1.1 (ref 0.00–1.49)
PROTHROMBIN TIME: 14.4 s (ref 11.6–15.2)

## 2014-11-08 LAB — URINALYSIS, ROUTINE W REFLEX MICROSCOPIC
GLUCOSE, UA: NEGATIVE mg/dL
HGB URINE DIPSTICK: NEGATIVE
Ketones, ur: 15 mg/dL — AB
Nitrite: NEGATIVE
PH: 5 (ref 5.0–8.0)
Protein, ur: NEGATIVE mg/dL
SPECIFIC GRAVITY, URINE: 1.037 — AB (ref 1.005–1.030)
Urobilinogen, UA: 1 mg/dL (ref 0.0–1.0)

## 2014-11-08 LAB — APTT: aPTT: 27 seconds (ref 24–37)

## 2014-11-08 LAB — HCG, SERUM, QUALITATIVE: Preg, Serum: NEGATIVE

## 2014-11-08 NOTE — Pre-Procedure Instructions (Signed)
    Alice Simpson  11/08/2014      [facility] - [place] [place] [address] Phone: [phone] Fax: [phone]    Your procedure is scheduled on July 5  Report to [facility] Main Entrance "A" at 7:15 A.M.  Call this number if you have problems the morning of surgery:  [phone]               Any questions prior to surgery call [phone] (Monday-Friday 8am-4pm)   Remember:  Do not eat food or drink liquids after midnight.  Take these medicines the morning of surgery with A SIP OF WATER : tylenol if needed, xanax if needed, wellbutrin (bupropion), meclizine if needed, metaxalone(skelaxin), zofran if needed, hydrocodone if needed   Do not wear jewelry, make-up or nail polish.  Do not wear lotions, powders, or perfumes.  You may wear deodorant.  Do not shave 48 hours prior to surgery.  Men may shave face and neck.  Do not bring valuables to the hospital.  [facility] is not responsible for any belongings or valuables.  Contacts, dentures or bridgework may not be worn into surgery.  Leave your suitcase in the car.  After surgery it may be brought to your room.  For patients admitted to the hospital, discharge time will be determined by your treatment team.  Patients discharged the day of surgery will not be allowed to drive home.   Name and phone number of your driver:    Special instructions:  Review preparing for surgery handout  Please read over the following fact sheets that you were given. Pain Booklet, Coughing and Deep Breathing, Blood Transfusion Information, MRSA Information and Surgical Site Infection Prevention

## 2014-11-08 NOTE — Progress Notes (Signed)
PCP: Dr. [name]  Pt. States she is pre-diabetic now after gastric sleeve surgery. States blood sugars run 109 fasting.  Pt. Refused to wear blood bracelet, stated it would bother her and she would take it off. Pt. Is aware blood draw will be done day of surgery.

## 2014-11-16 NOTE — Progress Notes (Addendum)
Left message for patient that surgery time is 1019 and she needs to arrive at 800 am 11/20/14.  Spoke with patient and instructed her to arrive at 800 am 11/20/14.

## 2014-11-16 NOTE — H&P (Signed)
TOTAL HIP ADMISSION H&P  Patient is admitted for right total hip arthroplasty.  Subjective:  Chief Complaint: right hip pain  HPI: Alice Simpson, 49 y.o. female, has a history of pain and functional disability in the right hip(s) due to arthritis and patient has failed non-surgical conservative treatments for greater than 12 weeks to include NSAID's and/or analgesics, corticosteriod injections, weight reduction as appropriate and activity modification.  Onset of symptoms was gradual starting 6 years ago with gradually worsening course since that time.The patient noted no past surgery on the right hip(s).  Patient currently rates pain in the right hip at 10 out of 10 with activity. Patient has night pain, worsening of pain with activity and weight bearing, trendelenberg gait, pain that interfers with activities of daily living and crepitus. Patient has evidence of joint subluxation and joint space narrowing by imaging studies. This condition presents safety issues increasing the risk of falls. This patient has had no.  There is no current active infection.  Patient Active Problem List   Diagnosis Date Noted  . S/P laparoscopic sleeve gastrectomy 09/24/2014  . PCOS (polycystic ovarian syndrome) 08/02/2013  . Depressive disorder 05/26/2013  . Type 2 diabetes mellitus, uncontrolled 05/26/2013  . Hyperlipidemia 05/26/2013  . Low back pain 05/26/2013  . Migraine 05/26/2013   Past Medical History  Diagnosis Date  . Diabetes mellitus without complication   . Hyperlipidemia   . Depression   . Anxiety   . Migraine   . Osteoarthritis   . Neuropathy   . PCOS (polycystic ovarian syndrome)     Past Surgical History  Procedure Laterality Date  . Cesarean section    . Wisdom    . Wisdom tooth extraction    . Bunionectomy    . Laparoscopic gastric sleeve resection N/A 09/24/2014    Procedure: LAPAROSCOPIC GASTRIC SLEEVE RESECTION upper endoscopy;  Surgeon: Johnathan Hausen, MD;  Location: [facility];   Service: General;  Laterality: N/A;    No prescriptions prior to admission   Allergies  Allergen Reactions  . Lactose Intolerance (Gi)   . Penicillin G Hives    hives  . Prochlorperazine Itching and Anxiety    History  Substance Use Topics  . Smoking status: Former Smoker -- 1.00 packs/day for 25 years    Types: Cigarettes    Quit date: 05/18/2004  . Smokeless tobacco: Never Used  . Alcohol Use: Yes     Comment: rarely when does is liquor     Family History  Problem Relation Age of Onset  . Diabetes Other   . Depression Other   . Anxiety disorder Other      Review of Systems  Gastrointestinal:       Recent gastric bypass  Musculoskeletal: Positive for joint pain.    Objective:  Physical Exam  Constitutional: She is oriented to person, place, and time. She appears well-nourished.  HENT:  Head: Normocephalic.  Eyes: Pupils are equal, round, and reactive to light.  Neck: Normal range of motion.  Cardiovascular: Normal heart sounds.   Respiratory: Effort normal.  GI: Soft.  Musculoskeletal:  Right hip motion is very limited severe pain with rotation.  Leg lengths grossly equal.  Sensory motor function intact  Neurological: She is oriented to person, place, and time.  Skin: Skin is dry.  Psychiatric: She has a normal mood and affect.    Vital signs in last 24 hours:    Labs:   Estimated body mass index is 50.42 kg/(m^2) as calculated from  the following:   Height as of 10/09/14: 5\' 8"  (1.727 m).   Weight as of 10/09/14: 150.367 kg (331 lb 8 oz).   Imaging Review Plain radiographs demonstrate severe degenerative joint disease of the right hip(s). The bone quality appears to be good for age and reported activity level.  Assessment/Plan:  End stagePrimaryarthritis, right hip(s)  The patient history, physical examination, clinical judgement of the provider and imaging studies are consistent with end stage degenerative joint disease of the right hip(s) and  total hip arthroplasty is deemed medically necessary. The treatment options including medical management, injection therapy, arthroscopy and arthroplasty were discussed at length. The risks and benefits of total hip arthroplasty were presented and reviewed. The risks due to aseptic loosening, infection, stiffness, dislocation/subluxation,  thromboembolic complications and other imponderables were discussed.  The patient acknowledged the explanation, agreed to proceed with the plan and consent was signed. Patient is being admitted for inpatient treatment for surgery, pain control, PT, OT, prophylactic antibiotics, VTE prophylaxis, progressive ambulation and ADL's and discharge planning.The patient is planning to be discharged home with home health services .  The plan is to use ASA 325 twice a day has DVT prophylaxis

## 2014-11-19 MED ORDER — CHLORHEXIDINE GLUCONATE 4 % EX LIQD: 60.0000 mL | Freq: Once | CUTANEOUS | Status: DC

## 2014-11-19 MED ORDER — LACTATED RINGERS IV SOLN
INTRAVENOUS | Status: DC
  Administered 2014-11-20 (×3): via INTRAVENOUS

## 2014-11-19 MED ORDER — VANCOMYCIN HCL 10 G IV SOLR
1500.0000 mg | INTRAVENOUS | Status: DC
  Filled 2014-11-19: qty 1500

## 2014-11-19 MED ORDER — VANCOMYCIN HCL 10 G IV SOLR
1500.0000 mg | INTRAVENOUS | Status: AC
  Administered 2014-11-20 (×2): 1500 mg via INTRAVENOUS

## 2014-11-20 ENCOUNTER — Inpatient Hospital Stay (HOSPITAL_COMMUNITY): Payer: Medicaid Other

## 2014-11-20 ENCOUNTER — Encounter (HOSPITAL_COMMUNITY): Payer: Self-pay | Admitting: Certified Registered Nurse Anesthetist

## 2014-11-20 ENCOUNTER — Inpatient Hospital Stay (HOSPITAL_COMMUNITY)
Admission: RE | Admit: 2014-11-20 | Discharge: 2014-11-24 | DRG: 470 | Disposition: A | Discharge status: Home Health | Payer: Medicaid Other | Source: Ambulatory Visit | Attending: Orthopaedic Surgery | Admitting: Orthopaedic Surgery

## 2014-11-20 ENCOUNTER — Inpatient Hospital Stay (HOSPITAL_COMMUNITY): Payer: Medicaid Other | Admitting: Anesthesiology

## 2014-11-20 ENCOUNTER — Encounter (HOSPITAL_COMMUNITY)
Admission: RE | Disposition: A | Discharge status: Home Health | Payer: Self-pay | Source: Ambulatory Visit | Attending: Orthopaedic Surgery

## 2014-11-20 DIAGNOSIS — Z880 Allergy status to penicillin: Secondary | ICD-10-CM

## 2014-11-20 DIAGNOSIS — Z7901 Long term (current) use of anticoagulants: Secondary | ICD-10-CM

## 2014-11-20 DIAGNOSIS — IMO0002 Reserved for concepts with insufficient information to code with codable children: Secondary | ICD-10-CM | POA: Diagnosis present

## 2014-11-20 DIAGNOSIS — Z87891 Personal history of nicotine dependence: Secondary | ICD-10-CM

## 2014-11-20 DIAGNOSIS — E785 Hyperlipidemia, unspecified: Secondary | ICD-10-CM | POA: Diagnosis present

## 2014-11-20 DIAGNOSIS — Z96649 Presence of unspecified artificial hip joint: Secondary | ICD-10-CM

## 2014-11-20 DIAGNOSIS — E1165 Type 2 diabetes mellitus with hyperglycemia: Secondary | ICD-10-CM | POA: Diagnosis present

## 2014-11-20 DIAGNOSIS — Z9884 Bariatric surgery status: Secondary | ICD-10-CM | POA: Diagnosis not present

## 2014-11-20 DIAGNOSIS — M1611 Unilateral primary osteoarthritis, right hip: Principal | ICD-10-CM | POA: Diagnosis present

## 2014-11-20 DIAGNOSIS — Z419 Encounter for procedure for purposes other than remedying health state, unspecified: Secondary | ICD-10-CM

## 2014-11-20 DIAGNOSIS — F329 Major depressive disorder, single episode, unspecified: Secondary | ICD-10-CM | POA: Diagnosis present

## 2014-11-20 DIAGNOSIS — F419 Anxiety disorder, unspecified: Secondary | ICD-10-CM | POA: Diagnosis present

## 2014-11-20 DIAGNOSIS — Z888 Allergy status to other drugs, medicaments and biological substances status: Secondary | ICD-10-CM | POA: Diagnosis not present

## 2014-11-20 DIAGNOSIS — E1140 Type 2 diabetes mellitus with diabetic neuropathy, unspecified: Secondary | ICD-10-CM | POA: Diagnosis present

## 2014-11-20 DIAGNOSIS — Z6841 Body Mass Index (BMI) 40.0 and over, adult: Secondary | ICD-10-CM

## 2014-11-20 HISTORY — PX: TOTAL HIP ARTHROPLASTY: SHX124

## 2014-11-20 LAB — TYPE AND SCREEN
ABO/RH(D): O POS
ANTIBODY SCREEN: NEGATIVE

## 2014-11-20 LAB — CBC
HCT: 36.8 % (ref 36.0–46.0)
Hemoglobin: 12 g/dL (ref 12.0–15.0)
MCH: 27.1 pg (ref 26.0–34.0)
MCHC: 32.6 g/dL (ref 30.0–36.0)
MCV: 83.3 fL (ref 78.0–100.0)
PLATELETS: 355 10*3/uL (ref 150–400)
RBC: 4.42 MIL/uL (ref 3.87–5.11)
RDW: 14.3 % (ref 11.5–15.5)
WBC: 22.9 10*3/uL — AB (ref 4.0–10.5)

## 2014-11-20 LAB — GLUCOSE, CAPILLARY
GLUCOSE-CAPILLARY: 119 mg/dL — AB (ref 65–99)
Glucose-Capillary: 124 mg/dL — ABNORMAL HIGH (ref 65–99)
Glucose-Capillary: 116 mg/dL — ABNORMAL HIGH (ref 65–99)
Glucose-Capillary: 138 mg/dL — ABNORMAL HIGH (ref 65–99)

## 2014-11-20 LAB — CREATININE, SERUM
Creatinine, Ser: 0.74 mg/dL (ref 0.44–1.00)
GFR calc Af Amer: >60 mL/min (ref >60)

## 2014-11-20 LAB — ABO/RH: ABO/RH(D): O POS

## 2014-11-20 SURGERY — ARTHROPLASTY, HIP, TOTAL, ANTERIOR APPROACH
Anesthesia: Monitor Anesthesia Care | Site: Hip | Laterality: Right

## 2014-11-20 MED ORDER — HYDROMORPHONE HCL 1 MG/ML IJ SOLN
0.5000 mg | Freq: Once | INTRAMUSCULAR | Status: AC
  Administered 2014-11-20: 0.5 mg via INTRAVENOUS

## 2014-11-20 MED ORDER — METOCLOPRAMIDE HCL 5 MG/ML IJ SOLN: 5.0000 mg | Freq: Three times a day (TID) | INTRAMUSCULAR | Status: DC | PRN

## 2014-11-20 MED ORDER — FENTANYL CITRATE (PF) 250 MCG/5ML IJ SOLN
INTRAMUSCULAR | Status: AC
  Filled 2014-11-20: qty 5

## 2014-11-20 MED ORDER — HYDROMORPHONE HCL 1 MG/ML IJ SOLN
0.5000 mg | INTRAMUSCULAR | Status: DC | PRN
  Administered 2014-11-20 – 2014-11-21 (×8): 1 mg via INTRAVENOUS
  Filled 2014-11-20 (×8): qty 1

## 2014-11-20 MED ORDER — FENTANYL CITRATE (PF) 100 MCG/2ML IJ SOLN
INTRAMUSCULAR | Status: DC | PRN
  Administered 2014-11-20 (×2): 50 ug via INTRAVENOUS
  Administered 2014-11-20: 25 ug via INTRAVENOUS
  Administered 2014-11-20 (×2): 50 ug via INTRAVENOUS
  Administered 2014-11-20: 25 ug via INTRAVENOUS

## 2014-11-20 MED ORDER — METHOCARBAMOL 500 MG PO TABS
500.0000 mg | ORAL_TABLET | Freq: Four times a day (QID) | ORAL | Status: DC | PRN
  Administered 2014-11-21 – 2014-11-23 (×8): 500 mg via ORAL
  Filled 2014-11-20 (×9): qty 1

## 2014-11-20 MED ORDER — ONDANSETRON HCL 4 MG/2ML IJ SOLN
INTRAMUSCULAR | Status: DC | PRN
  Administered 2014-11-20: 4 mg via INTRAVENOUS

## 2014-11-20 MED ORDER — METFORMIN HCL ER 500 MG PO TB24
500.0000 mg | ORAL_TABLET | Freq: Every day | ORAL | Status: DC
  Administered 2014-11-21 – 2014-11-24 (×4): 500 mg via ORAL
  Filled 2014-11-20 (×4): qty 1

## 2014-11-20 MED ORDER — ACETAMINOPHEN 325 MG PO TABS: 650.0000 mg | ORAL_TABLET | Freq: Four times a day (QID) | ORAL | Status: DC | PRN

## 2014-11-20 MED ORDER — PHENYLEPHRINE HCL 10 MG/ML IJ SOLN
10.0000 mg | INTRAVENOUS | Status: DC | PRN
  Administered 2014-11-20: 5 ug/min via INTRAVENOUS

## 2014-11-20 MED ORDER — BISACODYL 5 MG PO TBEC: 5.0000 mg | DELAYED_RELEASE_TABLET | Freq: Every day | ORAL | Status: DC | PRN

## 2014-11-20 MED ORDER — METAXALONE 800 MG PO TABS
800.0000 mg | ORAL_TABLET | Freq: Two times a day (BID) | ORAL | Status: DC | PRN
  Filled 2014-11-20: qty 1

## 2014-11-20 MED ORDER — HYDROMORPHONE HCL 1 MG/ML IJ SOLN
INTRAMUSCULAR | Status: AC
  Filled 2014-11-20: qty 1

## 2014-11-20 MED ORDER — BUPIVACAINE IN DEXTROSE 0.75-8.25 % IT SOLN
INTRATHECAL | Status: DC | PRN
  Administered 2014-11-20: 15 mg via INTRATHECAL

## 2014-11-20 MED ORDER — LACTATED RINGERS IV SOLN: INTRAVENOUS | Status: DC

## 2014-11-20 MED ORDER — MIDAZOLAM HCL 5 MG/5ML IJ SOLN
INTRAMUSCULAR | Status: DC | PRN
  Administered 2014-11-20: 2 mg via INTRAVENOUS

## 2014-11-20 MED ORDER — DOCUSATE SODIUM 100 MG PO CAPS
100.0000 mg | ORAL_CAPSULE | Freq: Two times a day (BID) | ORAL | Status: DC
  Administered 2014-11-23 – 2014-11-24 (×3): 100 mg via ORAL
  Filled 2014-11-20 (×6): qty 1

## 2014-11-20 MED ORDER — VANCOMYCIN HCL IN DEXTROSE 1-5 GM/200ML-% IV SOLN
1000.0000 mg | Freq: Two times a day (BID) | INTRAVENOUS | Status: AC
  Administered 2014-11-20: 1000 mg via INTRAVENOUS
  Filled 2014-11-20: qty 200

## 2014-11-20 MED ORDER — ONDANSETRON HCL 4 MG PO TABS
4.0000 mg | ORAL_TABLET | Freq: Four times a day (QID) | ORAL | Status: DC | PRN
  Filled 2014-11-20: qty 1

## 2014-11-20 MED ORDER — BUPROPION HCL ER (XL) 150 MG PO TB24
150.0000 mg | ORAL_TABLET | Freq: Every morning | ORAL | Status: DC
  Administered 2014-11-21 – 2014-11-24 (×4): 150 mg via ORAL
  Filled 2014-11-20 (×4): qty 1

## 2014-11-20 MED ORDER — METOCLOPRAMIDE HCL 5 MG PO TABS: 5.0000 mg | ORAL_TABLET | Freq: Three times a day (TID) | ORAL | Status: DC | PRN

## 2014-11-20 MED ORDER — WARFARIN SODIUM 7.5 MG PO TABS
7.5000 mg | ORAL_TABLET | Freq: Once | ORAL | Status: DC
Start: 2014-11-20 — End: 2014-11-21
  Filled 2014-11-20: qty 1

## 2014-11-20 MED ORDER — PROPOFOL 10 MG/ML IV BOLUS
INTRAVENOUS | Status: DC | PRN
  Administered 2014-11-20: 20 mg via INTRAVENOUS

## 2014-11-20 MED ORDER — ALUM & MAG HYDROXIDE-SIMETH 200-200-20 MG/5ML PO SUSP
30.0000 mL | ORAL | Status: DC | PRN
Start: 2014-11-20 — End: 2014-11-24

## 2014-11-20 MED ORDER — LACTATED RINGERS IV SOLN
INTRAVENOUS | Status: DC
  Administered 2014-11-20: 50 mL/h via INTRAVENOUS

## 2014-11-20 MED ORDER — TRANEXAMIC ACID 1000 MG/10ML IV SOLN
1000.0000 mg | INTRAVENOUS | Status: AC
  Administered 2014-11-20: 1000 mg via INTRAVENOUS
  Filled 2014-11-20: qty 10

## 2014-11-20 MED ORDER — ONDANSETRON HCL 4 MG/2ML IJ SOLN: 4.0000 mg | Freq: Four times a day (QID) | INTRAMUSCULAR | Status: DC | PRN

## 2014-11-20 MED ORDER — OXYCODONE HCL 5 MG/5ML PO SOLN
5.0000 mg | ORAL | Status: DC | PRN
  Administered 2014-11-20 – 2014-11-22 (×10): 10 mg via ORAL
  Filled 2014-11-20 (×10): qty 10

## 2014-11-20 MED ORDER — ENOXAPARIN SODIUM 40 MG/0.4ML ~~LOC~~ SOLN
40.0000 mg | SUBCUTANEOUS | Status: DC
  Administered 2014-11-21 – 2014-11-23 (×3): 40 mg via SUBCUTANEOUS
  Filled 2014-11-20 (×3): qty 0.4

## 2014-11-20 MED ORDER — ACETAMINOPHEN 650 MG RE SUPP: 650.0000 mg | Freq: Four times a day (QID) | RECTAL | Status: DC | PRN

## 2014-11-20 MED ORDER — 0.9 % SODIUM CHLORIDE (POUR BTL) OPTIME
TOPICAL | Status: DC | PRN
  Administered 2014-11-20: 1000 mL

## 2014-11-20 MED ORDER — UNJURY CHICKEN SOUP POWDER
4.0000 [oz_av] | Freq: Four times a day (QID) | ORAL | Status: DC
  Filled 2014-11-20 (×10): qty 27

## 2014-11-20 MED ORDER — FLUOXETINE HCL 40 MG PO CAPS: 40.0000 mg | ORAL_CAPSULE | Freq: Every day | ORAL | Status: DC

## 2014-11-20 MED ORDER — MIDAZOLAM HCL 2 MG/2ML IJ SOLN
INTRAMUSCULAR | Status: AC
  Filled 2014-11-20: qty 2

## 2014-11-20 MED ORDER — ALPRAZOLAM 0.5 MG PO TABS
0.5000 mg | ORAL_TABLET | Freq: Every day | ORAL | Status: DC | PRN
  Administered 2014-11-20 – 2014-11-23 (×5): 0.5 mg via ORAL
  Filled 2014-11-20 (×5): qty 1

## 2014-11-20 MED ORDER — PHENOL 1.4 % MT LIQD: 1.0000 | OROMUCOSAL | Status: DC | PRN

## 2014-11-20 MED ORDER — SODIUM FLUORIDE 1.1 % DT CREA: 1.0000 "application " | TOPICAL_CREAM | Freq: Two times a day (BID) | DENTAL | Status: DC

## 2014-11-20 MED ORDER — DIPHENHYDRAMINE HCL 12.5 MG/5ML PO ELIX
12.5000 mg | ORAL_SOLUTION | ORAL | Status: DC | PRN
  Administered 2014-11-21 – 2014-11-23 (×5): 25 mg via ORAL
  Filled 2014-11-20 (×6): qty 10

## 2014-11-20 MED ORDER — MENTHOL 3 MG MT LOZG: 1.0000 | LOZENGE | OROMUCOSAL | Status: DC | PRN

## 2014-11-20 MED ORDER — PHENYLEPHRINE HCL 10 MG/ML IJ SOLN
INTRAMUSCULAR | Status: DC | PRN
Start: 2014-11-20 — End: 2014-11-20
  Administered 2014-11-20: 40 ug via INTRAVENOUS
  Administered 2014-11-20: 20 ug via INTRAVENOUS
  Administered 2014-11-20: 80 ug via INTRAVENOUS
  Administered 2014-11-20: 40 ug via INTRAVENOUS
  Administered 2014-11-20: 20 ug via INTRAVENOUS
  Administered 2014-11-20 (×4): 40 ug via INTRAVENOUS

## 2014-11-20 MED ORDER — FLUOXETINE HCL 20 MG/5ML PO SOLN
40.0000 mg | Freq: Every day | ORAL | Status: DC
  Administered 2014-11-20 – 2014-11-23 (×4): 40 mg via ORAL
  Filled 2014-11-20 (×6): qty 10

## 2014-11-20 MED ORDER — PROPOFOL INFUSION 10 MG/ML OPTIME
INTRAVENOUS | Status: DC | PRN
  Administered 2014-11-20: 50 ug/kg/min via INTRAVENOUS
  Administered 2014-11-20: 11:00:00 via INTRAVENOUS

## 2014-11-20 MED ORDER — COUMADIN BOOK
Freq: Once | Status: DC
  Filled 2014-11-20: qty 1

## 2014-11-20 MED ORDER — HYDROMORPHONE HCL 1 MG/ML IJ SOLN
0.2500 mg | INTRAMUSCULAR | Status: DC | PRN
  Administered 2014-11-20 (×4): 0.5 mg via INTRAVENOUS

## 2014-11-20 MED ORDER — METHOCARBAMOL 1000 MG/10ML IJ SOLN
500.0000 mg | Freq: Four times a day (QID) | INTRAVENOUS | Status: DC | PRN
  Administered 2014-11-20: 500 mg via INTRAVENOUS
  Filled 2014-11-20 (×2): qty 5

## 2014-11-20 MED ORDER — FLUOXETINE HCL 20 MG PO CAPS: 40.0000 mg | ORAL_CAPSULE | Freq: Every day | ORAL | Status: DC

## 2014-11-20 MED ORDER — NORETHINDRONE 0.35 MG PO TABS: 1.0000 | ORAL_TABLET | Freq: Every day | ORAL | Status: DC

## 2014-11-20 MED ORDER — ACETAMINOPHEN 160 MG/5ML PO SOLN
650.0000 mg | Freq: Four times a day (QID) | ORAL | Status: DC | PRN
  Filled 2014-11-20: qty 20.3

## 2014-11-20 MED ORDER — SIMVASTATIN 40 MG PO TABS
40.0000 mg | ORAL_TABLET | Freq: Every evening | ORAL | Status: DC
  Administered 2014-11-21 – 2014-11-23 (×3): 40 mg via ORAL
  Filled 2014-11-20 (×5): qty 1

## 2014-11-20 MED ORDER — WARFARIN - PHARMACIST DOSING INPATIENT: Freq: Every day | Status: DC

## 2014-11-20 SURGICAL SUPPLY — 50 items
BLADE SAW SGTL 18X1.27X75 (BLADE) ×2 IMPLANT
BLADE SURG ROTATE 9660 (MISCELLANEOUS) IMPLANT
CAPT HIP TOTAL 2 ×1 IMPLANT
CELLS DAT CNTRL 66122 CELL SVR (MISCELLANEOUS) ×1 IMPLANT
COVER PERINEAL POST (MISCELLANEOUS) ×2 IMPLANT
COVER SURGICAL LIGHT HANDLE (MISCELLANEOUS) ×2 IMPLANT
DRAPE C-ARM 42X72 X-RAY (DRAPES) ×2 IMPLANT
DRAPE IMP U-DRAPE 54X76 (DRAPES) ×2 IMPLANT
DRAPE STERI IOBAN 125X83 (DRAPES) ×2 IMPLANT
DRAPE U-SHAPE 47X51 STRL (DRAPES) ×6 IMPLANT
DRSG AQUACEL AG ADV 3.5X10 (GAUZE/BANDAGES/DRESSINGS) ×2 IMPLANT
DURAPREP 26ML APPLICATOR (WOUND CARE) ×2 IMPLANT
ELECT BLADE 4.0 EZ CLEAN MEGAD (MISCELLANEOUS) ×2
ELECT CAUTERY BLADE 6.4 (BLADE) ×1 IMPLANT
ELECT REM PT RETURN 9FT ADLT (ELECTROSURGICAL) ×2
ELECTRODE BLDE 4.0 EZ CLN MEGD (MISCELLANEOUS) IMPLANT
ELECTRODE REM PT RTRN 9FT ADLT (ELECTROSURGICAL) ×1 IMPLANT
FACESHIELD WRAPAROUND (MASK) ×6 IMPLANT
FACESHIELD WRAPAROUND OR TEAM (MASK) ×2 IMPLANT
GLOVE BIO SURGEON STRL SZ8 (GLOVE) ×10 IMPLANT
GLOVE BIOGEL PI IND STRL 8 (GLOVE) ×2 IMPLANT
GLOVE BIOGEL PI INDICATOR 8 (GLOVE) ×2
GOWN STRL REUS W/ TWL LRG LVL3 (GOWN DISPOSABLE) ×1 IMPLANT
GOWN STRL REUS W/ TWL XL LVL3 (GOWN DISPOSABLE) ×2 IMPLANT
GOWN STRL REUS W/TWL LRG LVL3 (GOWN DISPOSABLE) ×2
GOWN STRL REUS W/TWL XL LVL3 (GOWN DISPOSABLE) ×4
KIT BASIN OR (CUSTOM PROCEDURE TRAY) ×2 IMPLANT
KIT ROOM TURNOVER OR (KITS) ×2 IMPLANT
LINER BOOT UNIVERSAL DISP (MISCELLANEOUS) ×1 IMPLANT
MANIFOLD NEPTUNE II (INSTRUMENTS) ×2 IMPLANT
NS IRRIG 1000ML POUR BTL (IV SOLUTION) ×2 IMPLANT
PACK TOTAL JOINT (CUSTOM PROCEDURE TRAY) ×2 IMPLANT
PACK UNIVERSAL I (CUSTOM PROCEDURE TRAY) ×2 IMPLANT
PAD ARMBOARD 7.5X6 YLW CONV (MISCELLANEOUS) ×4 IMPLANT
RETRACTOR WND ALEXIS 18 MED (MISCELLANEOUS) ×1 IMPLANT
RTRCTR WOUND ALEXIS 18CM MED (MISCELLANEOUS) ×2
STAPLER VISISTAT 35W (STAPLE) ×2 IMPLANT
SUT ETHIBOND NAB CT1 #1 30IN (SUTURE) ×6 IMPLANT
SUT VIC AB 0 CT1 27 (SUTURE) ×2
SUT VIC AB 0 CT1 27XBRD ANBCTR (SUTURE) IMPLANT
SUT VIC AB 1 CT1 27 (SUTURE) ×2
SUT VIC AB 1 CT1 27XBRD ANBCTR (SUTURE) ×1 IMPLANT
SUT VIC AB 2-0 CT1 27 (SUTURE) ×4
SUT VIC AB 2-0 CT1 TAPERPNT 27 (SUTURE) ×1 IMPLANT
SUT VLOC 180 0 24IN GS25 (SUTURE) ×2 IMPLANT
TOWEL OR 17X24 6PK STRL BLUE (TOWEL DISPOSABLE) ×2 IMPLANT
TOWEL OR 17X26 10 PK STRL BLUE (TOWEL DISPOSABLE) ×4 IMPLANT
TRAY FOLEY CATH 14FR (SET/KITS/TRAYS/PACK) IMPLANT
WATER STERILE IRR 1000ML POUR (IV SOLUTION) ×2 IMPLANT
YANKAUER SUCT BULB TIP NO VENT (SUCTIONS) ×1 IMPLANT

## 2014-11-20 NOTE — Anesthesia Preprocedure Evaluation (Addendum)
Anesthesia Evaluation  Patient identified by MRN, date of birth, ID band Patient awake    Reviewed: Allergy & Precautions, H&P , NPO status , Patient's Chart, lab work & pertinent test results  Airway Mallampati: II  TM Distance: >3 FB Neck ROM: Full    Dental no notable dental hx. (+) Teeth Intact, Dental Advisory Given, Chipped   Pulmonary neg pulmonary ROS, former smoker,  breath sounds clear to auscultation  Pulmonary exam normal       Cardiovascular negative cardio ROS  Rhythm:Regular Rate:Normal     Neuro/Psych  Headaches, Anxiety Depression negative neurological ROS     GI/Hepatic negative GI ROS, Neg liver ROS,   Endo/Other  diabetesMorbid obesity  Renal/GU negative Renal ROS  negative genitourinary   Musculoskeletal  (+) Arthritis -, Osteoarthritis,    Abdominal   Peds  Hematology negative hematology ROS (+)   Anesthesia Other Findings   Reproductive/Obstetrics negative OB ROS                            Anesthesia Physical Anesthesia Plan  ASA: III  Anesthesia Plan: MAC and Spinal   Post-op Pain Management:    Induction: Intravenous  Airway Management Planned: Simple Face Mask  Additional Equipment:   Intra-op Plan:   Post-operative Plan:   Informed Consent: I have reviewed the patients History and Physical, chart, labs and discussed the procedure including the risks, benefits and alternatives for the proposed anesthesia with the patient or authorized representative who has indicated his/her understanding and acceptance.   Dental advisory given  Plan Discussed with: CRNA  Anesthesia Plan Comments:         Anesthesia Quick Evaluation

## 2014-11-20 NOTE — Plan of Care (Signed)
Problem: Consults Goal: Diagnosis- Total Joint Replacement Primary Total Hip Right     

## 2014-11-20 NOTE — Progress Notes (Signed)
Utilization review completed. Natnael Biederman, RN, BSN. 

## 2014-11-20 NOTE — Anesthesia Postprocedure Evaluation (Signed)
  Anesthesia Post-op Note  Patient: Alice Simpson  Procedure(s) Performed: Procedure(s): TOTAL HIP ARTHROPLASTY ANTERIOR APPROACH (Right)  Patient Location: PACU  Anesthesia Type: Spinal/MAC  Level of Consciousness: awake and alert   Airway and Oxygen Therapy: Patient Spontanous Breathing  Post-op Pain: Controlled  Post-op Assessment: Post-op Vital signs reviewed, Patient's Cardiovascular Status Stable and Respiratory Function Stable. Block receeding.  Post-op Vital Signs: Reviewed  Filed Vitals:   11/20/14 1345  BP:   Pulse: 57  Temp:   Resp: 11    Complications: No apparent anesthesia complications

## 2014-11-20 NOTE — Evaluation (Signed)
Physical Therapy Evaluation Patient Details Name: Alice Simpson MRN: [id number] DOB: 04-Apr-1966 Today's Date: 11/20/2014   History of Present Illness  Patient is a 49 y/o female s/p R THA, direct anterior approach. PMH includes HTN, depression, anxiety.  Clinical Impression  Patient presents with pain and post surgical deficits RLE s/p above surgery impacting mobility. Tolerated short distance ambulation with Min A for balance and to advance RLE. Instructed pt in exercises. Pt will have supervision from spouse at home. Will continue to follow to maximize independence and mobilty prior to return home.    Follow Up Recommendations Home health PT;Supervision/Assistance - 24 hour    Equipment Recommendations  None recommended by PT    Recommendations for Other Services       Precautions / Restrictions Precautions Precautions: Fall Precaution Comments: direct anterior approach. Restrictions Weight Bearing Restrictions: Yes RLE Weight Bearing: Weight bearing as tolerated      Mobility  Bed Mobility Overal bed mobility: Needs Assistance Bed Mobility: Supine to Sit     Supine to sit: Mod assist;HOB elevated     General bed mobility comments: Assist to bring BLEs to EOB and scoot bottom to EOB.   Transfers Overall transfer level: Needs assistance Equipment used: Rolling walker (2 wheeled) Transfers: Sit to/from Stand Sit to Stand: Min assist         General transfer comment: Min A to boost from EOB. Cues for hand placement and technique.  Ambulation/Gait Ambulation/Gait assistance: Min assist Ambulation Distance (Feet): 5 Feet Assistive device: Rolling walker (2 wheeled) Gait Pattern/deviations: Step-to pattern;Decreased stance time - right;Decreased step length - left;Trunk flexed;Wide base of support   Gait velocity interpretation: Below normal speed for age/gender General Gait Details: Pt required assist advancing RLE. Increased WB through BUEs to offload  RLE.  Stairs            Wheelchair Mobility    Modified Rankin (Stroke Patients Only)       Balance Overall balance assessment: Needs assistance Sitting-balance support: Feet supported;Bilateral upper extremity supported Sitting balance-Leahy Scale: Fair     Standing balance support: During functional activity Standing balance-Leahy Scale: Poor Standing balance comment: Relient on RW for support.                              Pertinent Vitals/Pain Pain Assessment: Faces Faces Pain Scale: Hurts whole lot Pain Location: right hip Pain Descriptors / Indicators: Sore;Aching Pain Intervention(s): Monitored during session;Repositioned;Premedicated before session    Home Living Family/patient expects to be discharged to:: Private residence Living Arrangements: Spouse/significant other Available Help at Discharge: Family Type of Home: House Home Access: Stairs to enter   Technical brewer of Steps: 1 Home Layout: One level Home Equipment: Environmental consultant - 2 wheels      Prior Function Level of Independence: Independent               Hand Dominance        Extremity/Trunk Assessment   Upper Extremity Assessment: Defer to OT evaluation           Lower Extremity Assessment: RLE deficits/detail RLE Deficits / Details: Limited AROM/strength 2/2 to pain and post surgery. Difficulty initiating quad set.       Communication   Communication: No difficulties  Cognition Arousal/Alertness: Awake/alert Behavior During Therapy: WFL for tasks assessed/performed Overall Cognitive Status: Within Functional Limits for tasks assessed  General Comments General comments (skin integrity, edema, etc.): Pt's spouse present during evaluation.    Exercises Total Joint Exercises Ankle Circles/Pumps: Both;10 reps;Supine Quad Sets: Both;10 reps;Supine Gluteal Sets: Both;10 reps;Supine      Assessment/Plan    PT Assessment  Patient needs continued PT services  PT Diagnosis Difficulty walking;Acute pain;Generalized weakness   PT Problem List Decreased strength;Pain;Decreased range of motion;Decreased activity tolerance;Decreased balance;Decreased mobility;Decreased knowledge of use of DME  PT Treatment Interventions Balance training;Gait training;Stair training;Functional mobility training;Therapeutic exercise;Patient/family education;Therapeutic activities;DME instruction   PT Goals (Current goals can be found in the Care Plan section) Acute Rehab PT Goals Patient Stated Goal: to return to independence PT Goal Formulation: With patient Time For Goal Achievement: 12/04/14 Potential to Achieve Goals: Good    Frequency 7X/week   Barriers to discharge        Co-evaluation               End of Session Equipment Utilized During Treatment: Gait belt Activity Tolerance: Patient limited by pain;Patient tolerated treatment well Patient left: in chair;with call bell/phone within reach;with family/visitor present Nurse Communication: Mobility status         Time: 1600-1630 PT Time Calculation (min) (ACUTE ONLY): 30 min   Charges:   PT Evaluation $Initial PT Evaluation Tier I: 1 Procedure PT Treatments $Therapeutic Activity: 8-22 mins   PT G Codes:        Joslynn Jamroz A Lenoria Narine 11/20/2014, 4:39 PM Wray Kearns, [place], DPT [phone]

## 2014-11-20 NOTE — Progress Notes (Signed)
ANTICOAGULATION CONSULT NOTE - Initial Consult  Pharmacy Consult for warfarin Indication: VTE prophylaxis  Allergies  Allergen Reactions  . Lactose Intolerance (Gi)   . Penicillin G Hives    hives  . Prochlorperazine Itching and Anxiety    Patient Measurements: Height: 5\' 8"  (172.7 cm) Weight: (!) 317 lb (143.79 kg) IBW/kg (Calculated) : 63.9   Vital Signs: Temp: 97.6 F (36.4 C) (07/05 1534) Temp Source: Oral (07/05 0817) BP: 126/57 mmHg (07/05 1534) Pulse Rate: 76 (07/05 1534)  Labs: No results for input(s): HGB, HCT, PLT, APTT, LABPROT, INR, HEPARINUNFRC, CREATININE, CKTOTAL, CKMB, TROPONINI in the last 72 hours.  Estimated Creatinine Clearance: 128.8 mL/min (by C-G formula based on Cr of 0.67).   Medical History: Past Medical History  Diagnosis Date  . Diabetes mellitus without complication   . Hyperlipidemia   . Depression   . Anxiety   . Migraine   . Osteoarthritis   . Neuropathy   . PCOS (polycystic ovarian syndrome)     Assessment: 21 YOF s/p total right hip to start warfarin for VTE prophylaxis. Hgb and plts from 11/08/2014 were WNL at 13.7 and 355, respectively. No mention of excessive bleeding intra-op. Patient has orders for Lovenox 40mg  subQ q24h to start in the morning.  Goal of Therapy:  INR 2-3 Monitor platelets by anticoagulation protocol: Yes   Plan:   -warfarin 7.5mg  po x1 tonight -recommend increasing Lovenox to 0.5mg /kg (70mg ) subQ q24h in patient with BMI >30 -Lovenox until INR is >/= 1.8 per orders -daily INR -follow for s/s bleeding -will provide education  Kaylla Cobos D. Ihor Meinzer, PharmD, BCPS Clinical Pharmacist Pager: [phone] 11/20/2014 3:44 PM

## 2014-11-20 NOTE — Op Note (Signed)
PRE-OP DIAGNOSIS:  RIGHT HIP DEGENERATIVE JOINT DISEASE POST-OP DIAGNOSIS:  same PROCEDURE: RIGHT TOTAL HIP ARTHROPLASTY ANTERIOR APPROACH ANESTHESIA:  Spinal and MAC SURGEON:  Melrose Nakayama MD ASSISTANT:  Loni Dolly PA-C   INDICATIONS FOR PROCEDURE:  The patient is a 49 y.o. female with a long history of a painful hip.  This has persisted despite multiple conservative measures.  The patient has persisted with pain and dysfunction making rest and activity difficult.  A total hip replacement is offered as surgical treatment.  Informed operative consent was obtained after discussion of possible complications including reaction to anesthesia, infection, neurovascular injury, dislocation, DVT, PE, and death.  The importance of the postoperative rehab program to optimize result was stressed with the patient.  SUMMARY OF FINDINGS AND PROCEDURE:  Under general anesthesia through a anterior approach an the Hana table a right THR was performed.  The patient had severe degenerative change and excellent bone quality.  We used DePuy components to replace the hip and these were size KA 11 Corail femur capped with a +1 66mm ceramic hip ball.  On the acetabular side we used a size 50 Gription shell with a  plus 0 neutral polyethylene liner.  We did use a hole eliminator.  Loni Dolly PA-C assisted throughout and was invaluable to the completion of the case in that he helped position and retract while I performed the procedure.  He also closed simultaneously to help minimize OR time.  I used fluoroscopy throughout the case to check position of implants and leg lengths and read all of these views myself. This case was extremely difficult due to the size of the patient with a BMI of 48 and this added significantly to the OR time and required an additional assistant.   DESCRIPTION OF PROCEDURE:  The patient was taken to the OR suite where general anesthetic was applied.  The patient was then positioned on the Hana table  supine.  All bony prominences were appropriately padded.  Prep and drape was then performed in normal sterile fashion.  The patient was given vancomycin preoperative antibiotic and an appropriate time out was performed.  We then took an anterior approach to the right hip.  Dissection was taken through adipose to the tensor fascia lata fascia.  This structure was incised longitudinally and we dissected in the intermuscular interval just medial to this muscle.  Cobra retractors were placed superior and inferior to the femoral neck superficial to the capsule.  A capsular incision was then made and the retractors were placed along the femoral neck.  Xray was brought in to get a good level for the femoral neck cut which was made with an oscillating saw and osteotome.  The femoral head was removed with a corkscrew.  The acetabulum was exposed and some labral tissues were excised. Reaming was taken to the inside wall of the pelvis and sequentially up to 1 mm smaller than the actual component.  A trial of components was done and then the aforementioned acetabular shell was placed in appropriate tilt and anteversion confirmed by fluoroscopy. The liner was placed along with the hole eliminator and attention was turned to the femur.  The leg was brought down and over into adduction and the elevator bar was used to raise the femur up gently in the wound.  The piriformis was released with care taken to preserve the obturator internus attachment and all of the posterior capsule. The femur was reamed and then broached to the appropriate size.  A  trial reduction was done and the aforementioned head and neck assembly gave [place] the best stability in extension with external rotation.  Leg lengths were felt to be about equal by fluoroscopic exam.  The trial components were removed and the wound irrigated.  We then placed the femoral component in appropriate anteversion.  The head was applied to a dry stem neck and the hip again reduced.   It was again stable in the aforementioned position.  The would was irrigated again followed by re-approximation of anterior capsule with ethibond suture. Tensor fascia was repaired with V-loc suture  followed by subcutaneous closure with #O and #2 undyed vicryl.  Skin was closed with staples followed by a sterile dressing.  EBL and IOF can be obtained from anesthesia records.  DISPOSITION:  The patient was extubated in the OR and taken to PACU in stable condition to be admitted to the Orthopedic Surgery for appropriate post-op care to include perioperative antibiotics and DVT prophylaxis.

## 2014-11-20 NOTE — Transfer of Care (Signed)
Immediate Anesthesia Transfer of Care Note  Patient: Alice Simpson  Procedure(s) Performed: Procedure(s): TOTAL HIP ARTHROPLASTY ANTERIOR APPROACH (Right)  Patient Location: PACU  Anesthesia Type:General  Level of Consciousness: awake, alert  and oriented  Airway & Oxygen Therapy: Patient Spontanous Breathing and Patient connected to nasal cannula oxygen  Post-op Assessment: Report given to RN and Post -op Vital signs reviewed and stable  Post vital signs: Reviewed and stable  Last Vitals:  Filed Vitals:   11/20/14 1311  Pulse:   Temp: 36.4 C  Resp:     Complications: No apparent anesthesia complications   Bp 39/53

## 2014-11-20 NOTE — Anesthesia Procedure Notes (Signed)
Spinal Patient location during procedure: OR Start time: 11/20/2014 10:15 AM End time: 11/20/2014 10:20 AM Staffing Anesthesiologist: Roderic Palau Performed by: anesthesiologist  Preanesthetic Checklist Completed: patient identified, surgical consent, pre-op evaluation, timeout performed, IV checked, risks and benefits discussed and monitors and equipment checked Spinal Block Patient position: sitting Prep: DuraPrep Patient monitoring: cardiac monitor, continuous pulse ox and blood pressure Approach: midline Location: L2-3 Injection technique: single-shot Needle Needle type: Pencan  Needle gauge: 24 G Needle length: 10 cm Assessment Sensory level: T6 Additional Notes Functioning IV was confirmed and monitors were applied. Sterile prep and drape, including hand hygiene and sterile gloves were used. The patient was positioned and the spine was prepped. The skin was anesthetized with lidocaine.  Free flow of clear CSF was obtained prior to injecting local anesthetic into the CSF.  The spinal needle aspirated freely following injection.  The needle was carefully withdrawn.  The patient tolerated the procedure well.

## 2014-11-20 NOTE — Progress Notes (Signed)
Brief Nutrition Note:  Received phone call from RD, Antonieta Iba, at [facility]. She notified this RD that Alice Simpson was a recent bariatric surgical pt. She had her surgery 2 months ago. The pt is requesting to be placed on Bariatric Clear Liquid diet and to have a protein shake available, Unjury - chicken soup flavor.  Orders entered. [facility] does not [place], notified Pharmacy who will obtain from courier service.  Notified pt. RD will follow up to determine further needs.   [place], [place], [place] Pager [phone] After Hours Pager

## 2014-11-20 NOTE — Interval H&P Note (Signed)
OK for surgery PD 

## 2014-11-21 ENCOUNTER — Encounter (HOSPITAL_COMMUNITY): Payer: Self-pay | Admitting: General Practice

## 2014-11-21 LAB — BASIC METABOLIC PANEL
ANION GAP: 7 (ref 5–15)
BUN: <5 mg/dL — ABNORMAL LOW (ref 6–20)
CO2: 28 mmol/L (ref 22–32)
Calcium: 8.7 mg/dL — ABNORMAL LOW (ref 8.9–10.3)
Chloride: 100 mmol/L — ABNORMAL LOW (ref 101–111)
Creatinine, Ser: 0.7 mg/dL (ref 0.44–1.00)
GFR calc Af Amer: >60 mL/min (ref >60)
GFR calc non Af Amer: >60 mL/min (ref >60)
GLUCOSE: 150 mg/dL — AB (ref 65–99)
Potassium: 4.2 mmol/L (ref 3.5–5.1)
Sodium: 135 mmol/L (ref 135–145)

## 2014-11-21 LAB — CBC
HCT: 31.2 % — ABNORMAL LOW (ref 36.0–46.0)
Hemoglobin: 10.4 g/dL — ABNORMAL LOW (ref 12.0–15.0)
MCH: 27 pg (ref 26.0–34.0)
MCHC: 33.3 g/dL (ref 30.0–36.0)
MCV: 81 fL (ref 78.0–100.0)
Platelets: 262 10*3/uL (ref 150–400)
RBC: 3.85 MIL/uL — ABNORMAL LOW (ref 3.87–5.11)
RDW: 14.1 % (ref 11.5–15.5)
WBC: 12.1 10*3/uL — ABNORMAL HIGH (ref 4.0–10.5)

## 2014-11-21 LAB — PROTIME-INR
INR: 1.17 (ref 0.00–1.49)
PROTHROMBIN TIME: 15.1 s (ref 11.6–15.2)

## 2014-11-21 LAB — GLUCOSE, CAPILLARY
Glucose-Capillary: 119 mg/dL — ABNORMAL HIGH (ref 65–99)
Glucose-Capillary: 122 mg/dL — ABNORMAL HIGH (ref 65–99)

## 2014-11-21 MED ORDER — WARFARIN SODIUM 7.5 MG PO TABS
7.5000 mg | ORAL_TABLET | Freq: Once | ORAL | Status: AC
Start: 2014-11-21 — End: 2014-11-21
  Administered 2014-11-21: 7.5 mg via ORAL
  Filled 2014-11-21: qty 1

## 2014-11-21 NOTE — Progress Notes (Signed)
Subjective: 1 Day Post-Op Procedure(s) (LRB): TOTAL HIP ARTHROPLASTY ANTERIOR APPROACH (Right)  Activity level:  wbat Diet tolerance:  ok Voiding:  ok Patient reports pain as mild and moderate.    Objective: Vital signs in last 24 hours: Temp:  [97.6 F (36.4 C)-98.6 F (37 C)] 98.5 F (36.9 C) (07/06 0543) Pulse Rate:  [56-76] 76 (07/06 0543) Resp:  [11-24] 16 (07/06 0543) BP: (97-133)/(47-76) 133/62 mmHg (07/06 0543) SpO2:  [92 %-99 %] 97 % (07/06 0543) Weight:  [143.79 kg (317 lb)] 143.79 kg (317 lb) (07/05 0831)  Labs:  Recent Labs  11/20/14 1635 11/21/14 0604  HGB 12.0 10.4*    Recent Labs  11/20/14 1635 11/21/14 0604  WBC 22.9* 12.1*  RBC 4.42 3.85*  HCT 36.8 31.2*  PLT 355 262    Recent Labs  11/20/14 1635 11/21/14 0604  NA  --  135  K  --  4.2  CL  --  100*  CO2  --  28  BUN  --  PENDING  CREATININE 0.74 0.70  GLUCOSE  --  150*  CALCIUM  --  8.7*    Recent Labs  11/21/14 0604  INR 1.17    Physical Exam:  Neurologically intact ABD soft Neurovascular intact Sensation intact distally Intact pulses distally Dorsiflexion/Plantar flexion intact Incision: dressing C/D/I and no drainage No cellulitis present Compartment soft  Assessment/Plan:  1 Day Post-Op Procedure(s) (LRB): TOTAL HIP ARTHROPLASTY ANTERIOR APPROACH (Right) Advance diet Up with therapy D/C IV fluids Plan for discharge tomorrow Discharge home with home health if doing well and cleared by PT. Continue ou lovenox to coumadin for DVT prevention x 4 weeks. Will need outpatient Home health or other source to help manage coumadin. Follow up in office 2 weeks post op.   Aiana Nordquist, Larwance Sachs 11/21/2014, 7:14 AM

## 2014-11-21 NOTE — Progress Notes (Signed)
Physical Therapy Treatment Patient Details Name: Alice Simpson MRN: [id number] DOB: 1966-01-20 Today's Date: 11/21/2014    History of Present Illness Patient is a 49 y/o female s/p R THA, direct anterior approach. PMH includes HTN, depression, anxiety.    PT Comments    Pt ambulated with increased confidence with bariatric RW, recommend this for home as pt is over 300#. Tolerated 50' ambulation with min-guard A. Pt with minimal quad contraction on right, performed there ex focusing on this muscle group. PT will continue to follow.   Follow Up Recommendations  Home health PT;Supervision/Assistance - 24 hour     Equipment Recommendations  Rolling walker with 5" wheels (bariatric)    Recommendations for Other Services       Precautions / Restrictions Precautions Precautions: Fall Precaution Comments: direct anterior approach. Restrictions Weight Bearing Restrictions: No RLE Weight Bearing: Weight bearing as tolerated    Mobility  Bed Mobility Overal bed mobility: Needs Assistance Bed Mobility: Sit to Supine     Supine to sit: Mod assist Sit to supine: Mod assist   General bed mobility comments: mod A to get legs into bed with return to supine  Transfers Overall transfer level: Needs assistance Equipment used: Rolling walker (2 wheeled) Transfers: Sit to/from Stand Sit to Stand: Min assist         General transfer comment: continued to cue for safety with hand placement with sit to stand and stand to sit. Did the best with one hand on RW and one on bed.   Ambulation/Gait Ambulation/Gait assistance: Min guard Ambulation Distance (Feet): 50 Feet Assistive device: Rolling walker (2 wheeled) Gait Pattern/deviations: Step-to pattern;Decreased weight shift to right Gait velocity: decreased Gait velocity interpretation: <1.8 ft/sec, indicative of risk for recurrent falls General Gait Details: pt continuing to slide right LE fwd on floor instead of lifting, she began  to work on picking it up more with increased distance. Tried a Manufacturing engineer RW which improved gait pattern significantly because she felt more comfortable putting wt through it   Science writer    Modified Rankin (Stroke Patients Only)       Balance Overall balance assessment: Needs assistance Sitting-balance support: Feet supported Sitting balance-Leahy Scale: Normal     Standing balance support: During functional activity Standing balance-Leahy Scale: Fair Standing balance comment: Can stand without UE support for short periods of time                    Cognition Arousal/Alertness: Awake/alert Behavior During Therapy: WFL for tasks assessed/performed Overall Cognitive Status: Within Functional Limits for tasks assessed                      Exercises Total Joint Exercises Ankle Circles/Pumps: Both;10 reps;Supine Quad Sets: Both;10 reps;Supine Gluteal Sets: Both;10 reps;Supine Short Arc Quad: AAROM;Right;10 reps;Supine Hip ABduction/ADduction: AAROM;Right;10 reps;Supine Long Arc Quad: AAROM;Right;10 reps;Seated    General Comments General comments (skin integrity, edema, etc.): Pt has had RLE weakness for years before THA      Pertinent Vitals/Pain Pain Assessment: Faces Pain Score: 4  Faces Pain Scale: Hurts whole lot Pain Location: right hip Pain Descriptors / Indicators: Aching Pain Intervention(s): Limited activity within patient's tolerance;Monitored during session    Home Living Family/patient expects to be discharged to:: Private residence Living Arrangements: Children Available Help at Discharge: Family Type of Home: House Home Access: Stairs to enter   Home Layout:  One level Home Equipment: Walker - 2 wheels      Prior Function Level of Independence: Independent          PT Goals (current goals can now be found in the care plan section) Acute Rehab PT Goals Patient Stated Goal: To be able to walk  and perform exercises without pain. PT Goal Formulation: With patient Time For Goal Achievement: 12/04/14 Potential to Achieve Goals: Good Progress towards PT goals: Progressing toward goals    Frequency  BID    PT Plan Current plan remains appropriate;Equipment recommendations need to be updated    Co-evaluation             End of Session   Activity Tolerance: Patient tolerated treatment well Patient left: with call bell/phone within reach;in bed;with family/visitor present     Time: 6301-6010 PT Time Calculation (min) (ACUTE ONLY): 36 min  Charges:  $Gait Training: 8-22 mins $Therapeutic Activity: 8-22 mins                    G Codes:     Leighton Roach, PT  Acute Rehab Services  [phone]  Leighton Roach 11/21/2014, 4:52 PM

## 2014-11-21 NOTE — Evaluation (Signed)
Occupational Therapy Evaluation Patient Details Name: Alice Simpson MRN: [id number] DOB: December 01, 1965 Today's Date: 11/21/2014    History of Present Illness Patient is a 49 y/o female s/p R THA, direct anterior approach. PMH includes HTN, depression, anxiety.   Clinical Impression   Pt overall min assist level for selfcare tasks and toilet transfers at this time.  Will benefit from acute care OT to further progress independence to supervision level for home.  Feel she will need a wide 3:1 at discharge as well as tub bench that is capable of supporting 315 lbs.  Will continue to follow for OT services while on acute care.     Follow Up Recommendations  Home health OT;Supervision/Assistance - 24 hour    Equipment Recommendations  3 in 1 bedside comode;Tub/shower bench (wide 3:1 and tub bench to support 315 lbs)       Precautions / Restrictions Precautions Precautions: Fall Precaution Comments: direct anterior approach. Restrictions Weight Bearing Restrictions: No RLE Weight Bearing: Weight bearing as tolerated      Mobility Bed Mobility Overal bed mobility: Needs Assistance Bed Mobility: Supine to Sit;Sit to Supine     Supine to sit: Mod assist Sit to supine: Min assist   General bed mobility comments: Pt needs assistance for moving the RLE off of the edge of the bed.   Transfers Overall transfer level: Needs assistance Equipment used: Rolling walker (2 wheeled) Transfers: Sit to/from Stand Sit to Stand: Min assist         General transfer comment: Min instructional cueing for hand placement with sit to stand.    Balance     Sitting balance-Leahy Scale: Good       Standing balance-Leahy Scale: Fair                              ADL Overall ADL's : Needs assistance/impaired Eating/Feeding: Independent;Sitting   Grooming: Wash/dry hands;Wash/dry face;Min guard;Standing   Upper Body Bathing: Set up;Sitting   Lower Body Bathing: Sit to/from  stand;Minimal assistance   Upper Body Dressing : Set up;Sitting   Lower Body Dressing: Minimal assistance;Sit to/from stand;With adaptive equipment;Cueing for sequencing   Toilet Transfer: Minimal assistance;BSC;Ambulation;RW   Toileting- Clothing Manipulation and Hygiene: Min guard;Sit to/from stand       Functional mobility during ADLs: Minimal assistance;Rolling walker General ADL Comments: Pt educated on AE use for LB selfcare and reports seeing it at pre-op class as well.  Will need wide 3:1 and tub bench at discharge.      Vision Vision Assessment?: No apparent visual deficits   Perception Perception Perception Tested?: No   Praxis Praxis Praxis tested?: Within functional limits    Pertinent Vitals/Pain Pain Assessment: 0-10 Pain Score: 4  Pain Location: right hip Pain Descriptors / Indicators: Sore Pain Intervention(s): Limited activity within patient's tolerance;Monitored during session;Repositioned;Ice applied        Extremity/Trunk Assessment Upper Extremity Assessment Upper Extremity Assessment: Overall WFL for tasks assessed   Lower Extremity Assessment Lower Extremity Assessment: Defer to PT evaluation   Cervical / Trunk Assessment Cervical / Trunk Assessment: Normal   Communication Communication Communication: No difficulties   Cognition Arousal/Alertness: Awake/alert Behavior During Therapy: WFL for tasks assessed/performed Overall Cognitive Status: Within Functional Limits for tasks assessed                                Home Living Family/patient  expects to be discharged to:: Private residence Living Arrangements: Children Available Help at Discharge: Family Type of Home: House Home Access: Stairs to enter     Home Layout: One level     Bathroom Shower/Tub: Tub/shower unit Shower/tub characteristics: Curtain Biochemist, clinical: Pioneer: Environmental consultant - 2 wheels          Prior Functioning/Environment  Level of Independence: Independent             OT Diagnosis: Generalized weakness;Acute pain   OT Problem List: Decreased strength;Impaired balance (sitting and/or standing);Pain;Decreased knowledge of use of DME or AE   OT Treatment/Interventions: Self-care/ADL training;Balance training;Therapeutic activities;DME and/or AE instruction;Patient/family education    OT Goals(Current goals can be found in the care plan section) Acute Rehab OT Goals Patient Stated Goal: To be able to walk and perform exercises without pain. OT Goal Formulation: With patient/family Time For Goal Achievement: 11/28/14 Potential to Achieve Goals: Good  OT Frequency: Min 2X/week              End of Session Equipment Utilized During Treatment: Surveyor, mining Communication: Mobility status  Activity Tolerance: Patient tolerated treatment well Patient left: in chair;with call bell/phone within reach;with family/visitor present   Time: 1315-1400 OT Time Calculation (min): 45 min Charges:  OT General Charges $OT Visit: 1 Procedure OT Evaluation $Initial OT Evaluation Tier I: 1 Procedure OT Treatments $Self Care/Home Management : 38-52 mins  Chauntay Paszkiewicz OTR/L 11/21/2014, 2:14 PM

## 2014-11-21 NOTE — Progress Notes (Signed)
[place] for warfarin Indication: VTE prophylaxis  Allergies  Allergen Reactions  . Lactose Intolerance (Gi)   . Penicillin G Hives    hives  . Prochlorperazine Itching and Anxiety    Patient Measurements: Height: 5\' 8"  (172.7 cm) Weight: (!) 317 lb (143.79 kg) IBW/kg (Calculated) : 63.9   Vital Signs: Temp: 98.5 F (36.9 C) (07/06 0543) BP: 133/62 mmHg (07/06 0543) Pulse Rate: 76 (07/06 0543)  Labs:  Recent Labs  11/20/14 1635 11/21/14 0604  HGB 12.0 10.4*  HCT 36.8 31.2*  PLT 355 262  LABPROT  --  15.1  INR  --  1.17  CREATININE 0.74 0.70    Estimated Creatinine Clearance: 128.8 mL/min (by C-G formula based on Cr of 0.7).  Assessment: 59 YOF s/p total right hip to be on warfarin for VTE prophylaxis for 4 weeks. Orders were entered for last evening- noted patient/family refused warfarin dose with no further explanation. RN today to talk to patient and call me back with reason. If it is an issue with the medication, will address with ortho.  Lovenox 40mg  subQ given this morning. INR today is 1.17. Post-op ABLA, Hgb 10.4, plts 262- no overt bleeding noted.  Goal of Therapy:  INR 2-3 Monitor platelets by anticoagulation protocol: Yes   Plan:   -warfarin 7.5mg  po x1 tonight -recommend increasing Lovenox to 0.5mg /kg (70mg ) subQ q24h in patient with BMI >30 -Lovenox until INR is >/= 1.8 per orders -daily INR -follow for s/s bleeding -will provide education  Shuan Statzer D. Quida Glasser, PharmD, BCPS Clinical Pharmacist Pager: [phone] 11/21/2014 10:24 AM

## 2014-11-21 NOTE — Progress Notes (Signed)
Physical Therapy Treatment Patient Details Name: Alice Simpson MRN: [id number] DOB: 1966-01-29 Today's Date: 11/21/2014    History of Present Illness Patient is a 49 y/o female s/p R THA, direct anterior approach. PMH includes HTN, depression, anxiety.    PT Comments    Pt is progressing well with mobility. She walked 71' with RW and supervision and performed RLE exercises with min assist.   Follow Up Recommendations  Home health PT;Supervision/Assistance - 24 hour     Equipment Recommendations  None recommended by PT    Recommendations for Other Services       Precautions / Restrictions Precautions Precautions: Fall Precaution Comments: direct anterior approach. Restrictions Weight Bearing Restrictions: Yes RLE Weight Bearing: Weight bearing as tolerated    Mobility  Bed Mobility Overal bed mobility: Needs Assistance Bed Mobility: Supine to Sit;Sit to Supine     Supine to sit: HOB elevated;Min assist Sit to supine: Min assist   General bed mobility comments: Min A to support RLE, instructed pt to use sheet as leg lifter for in/out of bed.   Transfers Overall transfer level: Needs assistance Equipment used: Rolling walker (2 wheeled) Transfers: Sit to/from Stand Sit to Stand: Supervision         General transfer comment: . Cues for hand placement and technique.  Ambulation/Gait Ambulation/Gait assistance: Min guard Ambulation Distance (Feet): 35 Feet Assistive device: Rolling walker (2 wheeled) Gait Pattern/deviations: Step-to pattern   Gait velocity interpretation: Below normal speed for age/gender General Gait Details: pt not able to fully clear floor when advancing RLE   Stairs            Wheelchair Mobility    Modified Rankin (Stroke Patients Only)       Balance     Sitting balance-Leahy Scale: Good       Standing balance-Leahy Scale: Poor                      Cognition Arousal/Alertness: Awake/alert Behavior During  Therapy: WFL for tasks assessed/performed Overall Cognitive Status: Within Functional Limits for tasks assessed                      Exercises Total Joint Exercises Ankle Circles/Pumps: Both;10 reps;Supine Quad Sets: Both;10 reps;Supine Short Arc Quad: AAROM;Right;10 reps;Supine Heel Slides: AAROM;Right;10 reps;Supine Hip ABduction/ADduction: AAROM;Right;10 reps;Supine    General Comments        Pertinent Vitals/Pain Pain Score: 6  Pain Location: R hip Pain Descriptors / Indicators: Sore;Tightness Pain Intervention(s): Limited activity within patient's tolerance;Monitored during session;Premedicated before session;Ice applied    Home Living Family/patient expects to be discharged to:: Private residence Living Arrangements: Children                  Prior Function            PT Goals (current goals can now be found in the care plan section) Acute Rehab PT Goals Patient Stated Goal: to take her last 2 classes at [employer] for criminal justice degree PT Goal Formulation: With patient Time For Goal Achievement: 12/04/14 Potential to Achieve Goals: Good Progress towards PT goals: Progressing toward goals    Frequency  7X/week    PT Plan Current plan remains appropriate    Co-evaluation             End of Session   Activity Tolerance: Patient limited by pain;Patient tolerated treatment well Patient left: with call bell/phone within reach;in bed  Time: 0973-5329 PT Time Calculation (min) (ACUTE ONLY): 30 min  Charges:  $Gait Training: 8-22 mins $Therapeutic Exercise: 8-22 mins                    G Codes:      Philomena Doheny 11/21/2014, 12:44 PM [phone]

## 2014-11-21 NOTE — Progress Notes (Signed)
PT Cancellation Note  Patient Details Name: Alice Simpson MRN: [id number] DOB: [birth year]   Cancelled Treatment:    Reason Eval/Treat Not Completed: Pain limiting ability to participate (pt requested PT come later today after she receives her next dose of pain medicine. Will follow. )   Philomena Doheny 11/21/2014, 10:21 AM [phone]

## 2014-11-21 NOTE — Discharge Instructions (Addendum)
Information on my medicine - Coumadin   (Warfarin)  This medication education was reviewed with me or my healthcare representative as part of my discharge preparation.  The pharmacist that spoke with me during my hospital stay was:  Soyla Dryer, PharmD  Why was Coumadin prescribed for you? Coumadin was prescribed for you because you have a blood clot or a medical condition that can cause an increased risk of forming blood clots. Blood clots can cause serious health problems by blocking the flow of blood to the heart, lung, or brain. Coumadin can prevent harmful blood clots from forming. As a reminder your indication for Coumadin is:   Blood Clot Prevention After Orthopedic Surgery  What test will check on my response to Coumadin? While on Coumadin (warfarin) you will need to have an INR test regularly to ensure that your dose is keeping you in the desired range. The INR (international normalized ratio) number is calculated from the result of the laboratory test called prothrombin time (PT).  If an INR APPOINTMENT HAS NOT ALREADY BEEN MADE FOR YOU please schedule an appointment to have this lab work done by your health care provider within 7 days. Your INR goal is usually a number between:  2 to 3 or your provider may give you a more narrow range like 2-2.5.  Ask your health care provider during an office visit what your goal INR is.  What  do you need to  know  About  COUMADIN? Take Coumadin (warfarin) exactly as prescribed by your healthcare provider about the same time each day.  DO NOT stop taking without talking to the doctor who prescribed the medication.  Stopping without other blood clot prevention medication to take the place of Coumadin may increase your risk of developing a new clot or stroke.  Get refills before you run out.  What do you do if you miss a dose? If you miss a dose, take it as soon as you remember on the same day then continue your regularly scheduled regimen the next  day.  Do not take two doses of Coumadin at the same time.  Important Safety Information A possible side effect of Coumadin (Warfarin) is an increased risk of bleeding. You should call your healthcare provider right away if you experience any of the following: ? Bleeding from an injury or your nose that does not stop. ? Unusual colored urine (red or dark brown) or unusual colored stools (red or black). ? Unusual bruising for unknown reasons. ? A serious fall or if you hit your head (even if there is no bleeding).  Some foods or medicines interact with Coumadin (warfarin) and might alter your response to warfarin. To help avoid this: ? Eat a balanced diet, maintaining a consistent amount of Vitamin K. ? Notify your provider about major diet changes you plan to make. ? Avoid alcohol or limit your intake to 1 drink for women and 2 drinks for men per day. (1 drink is 5 oz. wine, 12 oz. beer, or 1.5 oz. liquor.)  Make sure that ANY health care provider who prescribes medication for you knows that you are taking Coumadin (warfarin).  Also make sure the healthcare provider who is monitoring your Coumadin knows when you have started a new medication including herbals and non-prescription products.  Coumadin (Warfarin)  Major Drug Interactions  Increased Warfarin Effect Decreased Warfarin Effect  Alcohol (large quantities) Antibiotics (esp. Septra/Bactrim, Flagyl, Cipro) Amiodarone (Cordarone) Aspirin (ASA) Cimetidine (Tagamet) Megestrol (Megace) NSAIDs (ibuprofen, naproxen,  etc.) °Piroxicam (Feldene) °Propafenone (Rythmol SR) °Propranolol (Inderal) °Isoniazid (INH) °Posaconazole (Noxafil) Barbiturates (Phenobarbital) °Carbamazepine (Tegretol) °Chlordiazepoxide (Librium) °Cholestyramine (Questran) °Griseofulvin °Oral Contraceptives °Rifampin °Sucralfate (Carafate) °Vitamin K  ° °Coumadin® (Warfarin) Major Herbal Interactions  °Increased Warfarin Effect Decreased Warfarin Effect   °Garlic °Ginseng °Ginkgo biloba Coenzyme Q10 °Green tea °St. John’s wort   ° °Coumadin® (Warfarin) FOOD Interactions  °Eat a consistent number of servings per week of foods HIGH in Vitamin K °(1 serving = ½ cup)  °Collards (cooked, or boiled & drained) °Kale (cooked, or boiled & drained) °Mustard greens (cooked, or boiled & drained) °Parsley *serving size only = ¼ cup °Spinach (cooked, or boiled & drained) °Swiss chard (cooked, or boiled & drained) °Turnip greens (cooked, or boiled & drained)  °Eat a consistent number of servings per week of foods MEDIUM-HIGH in Vitamin K °(1 serving = 1 cup)  °Asparagus (cooked, or boiled & drained) °Broccoli (cooked, boiled & drained, or raw & chopped) °Brussel sprouts (cooked, or boiled & drained) *serving size only = ½ cup °Lettuce, raw (green leaf, endive, romaine) °Spinach, raw °Turnip greens, raw & chopped  ° °These websites have more information on Coumadin (warfarin):  www.coumadin.com; °www.ahrq.gov/consumer/coumadin.htm; ° ° ° °

## 2014-11-22 LAB — PROTIME-INR
INR: 1.2 (ref 0.00–1.49)
Prothrombin Time: 15.4 s — ABNORMAL HIGH (ref 11.6–15.2)

## 2014-11-22 LAB — CBC
HCT: 29.1 % — ABNORMAL LOW (ref 36.0–46.0)
Hemoglobin: 9.6 g/dL — ABNORMAL LOW (ref 12.0–15.0)
MCH: 26.7 pg (ref 26.0–34.0)
MCHC: 33 g/dL (ref 30.0–36.0)
MCV: 80.8 fL (ref 78.0–100.0)
Platelets: 273 K/uL (ref 150–400)
RBC: 3.6 MIL/uL — ABNORMAL LOW (ref 3.87–5.11)
RDW: 14.1 % (ref 11.5–15.5)
WBC: 13.6 K/uL — ABNORMAL HIGH (ref 4.0–10.5)

## 2014-11-22 MED ORDER — OXYCODONE HCL 5 MG/5ML PO SOLN
5.0000 mg | ORAL | Status: DC | PRN
  Administered 2014-11-22 – 2014-11-24 (×9): 10 mg via ORAL
  Filled 2014-11-22 (×9): qty 10

## 2014-11-22 MED ORDER — WARFARIN SODIUM 7.5 MG PO TABS
7.5000 mg | ORAL_TABLET | Freq: Once | ORAL | Status: AC
  Administered 2014-11-22: 7.5 mg via ORAL
  Filled 2014-11-22: qty 1

## 2014-11-22 MED ORDER — PRO-STAT SUGAR FREE PO LIQD
30.0000 mL | Freq: Every day | ORAL | Status: DC
  Administered 2014-11-23: 30 mL via ORAL
  Filled 2014-11-22 (×2): qty 30

## 2014-11-22 NOTE — Progress Notes (Signed)
Physical Therapy Treatment Patient Details Name: Alice Simpson MRN: [id number] DOB: Jun 08, 1965 Today's Date: 11/22/2014    History of Present Illness Patient is a 49 y/o female s/p R THA, direct anterior approach. PMH includes HTN, depression, anxiety.    PT Comments    Slow progress due to weak (2/5) Rt quadriceps. Rt hip appears functional with advancement of RLE in swing phase of gait however difficulty advancing distal LE, demonstrating notable knee instability in weight bearing. Reviewed therapeutic exercises and practiced bed mobility, and gait training. Will continue to progress as tolerated. May benefit from temporary knee immobilizer if quad strength is slow to return.  Follow Up Recommendations  Home health PT;Supervision/Assistance - 24 hour     Equipment Recommendations  Rolling walker with 5" wheels;Other (comment) (bari walker)    Recommendations for Other Services       Precautions / Restrictions Precautions Precautions: Fall Precaution Comments: direct anterior approach. Restrictions Weight Bearing Restrictions: No RLE Weight Bearing: Weight bearing as tolerated    Mobility  Bed Mobility Overal bed mobility: Needs Assistance Bed Mobility: Supine to Sit;Sit to Supine     Supine to sit: Supervision Sit to supine: Min assist   General bed mobility comments: Supervision with max VC to exit bed using sheet to support RLE. Encouraged to use LLE to support RLE into bed, required min assist for RLE final 1/3 of lift into bed.  Transfers Overall transfer level: Needs assistance Equipment used: Rolling walker (2 wheeled) Transfers: Sit to/from Stand Sit to Stand: Min guard         General transfer comment: Min guard for safety from lowest bed setting x2. VC for hand placement. slow to rise but did not require physical assist.  Ambulation/Gait Ambulation/Gait assistance: Min guard Ambulation Distance (Feet): 20 Feet (x2) Assistive device: Rolling walker  (2 wheeled) Gait Pattern/deviations: Step-to pattern;Decreased step length - right;Decreased step length - left;Decreased stance time - right;Decreased stride length;Decreased weight shift to right;Antalgic;Steppage;Trunk flexed Gait velocity: decreased Gait velocity interpretation: <1.8 ft/sec, indicative of risk for recurrent falls General Gait Details: Decreased RLE advancement due to quad weakness. Demonstrates adequate hip flexion. Focused on Rt knee extension in stance phase for quad activation. VC for sequencing throughout. Easily fatigued due to heavy UE use on RW.   Stairs            Wheelchair Mobility    Modified Rankin (Stroke Patients Only)       Balance                                    Cognition Arousal/Alertness: Awake/alert Behavior During Therapy: WFL for tasks assessed/performed Overall Cognitive Status: Within Functional Limits for tasks assessed                      Exercises Total Joint Exercises Ankle Circles/Pumps: AROM;Both;10 reps;Supine Quad Sets: Strengthening;10 reps;Right;Supine Heel Slides: AAROM;Strengthening;Right;5 reps;Supine    General Comments General comments (skin integrity, edema, etc.): Time spent educating on Rt quad activation with stance and exercises.      Pertinent Vitals/Pain Pain Assessment: Faces Faces Pain Scale: Hurts little more Pain Location: Rt hip Pain Descriptors / Indicators: Aching;Sore Pain Intervention(s): Monitored during session;Repositioned;Premedicated before session    Home Living                      Prior Function  PT Goals (current goals can now be found in the care plan section) Acute Rehab PT Goals PT Goal Formulation: With patient Time For Goal Achievement: 12/04/14 Potential to Achieve Goals: Good Progress towards PT goals: Progressing toward goals    Frequency  BID    PT Plan Current plan remains appropriate    Co-evaluation              End of Session Equipment Utilized During Treatment: Gait belt Activity Tolerance: Patient limited by fatigue Patient left: with call bell/phone within reach (on toilet)     Time: 8333-8329 PT Time Calculation (min) (ACUTE ONLY): 35 min  Charges:  $Gait Training: 8-22 mins $Therapeutic Activity: 8-22 mins                    G Codes:      Ellouise Newer 12-03-2014, 6:47 PM Camille Bal [employer], [place]

## 2014-11-22 NOTE — Progress Notes (Signed)
OT Cancellation Note  Patient Details Name: Alice Simpson MRN: [id number] DOB: 1966-03-16   Cancelled Treatment:    Reason Eval/Treat Not Completed: Pain limiting ability to participate. Pt citing 7/10 pain. OT reattempt as schedule permits.   Hortencia Pilar 11/22/2014, 1:09 PM

## 2014-11-22 NOTE — Progress Notes (Signed)
Physical Therapy Treatment Patient Details Name: Alice Simpson MRN: [id number] DOB: 29-Sep-1965 Today's Date: 11/22/2014    History of Present Illness Patient is a 49 y/o female s/p R THA, direct anterior approach. PMH includes HTN, depression, anxiety.    PT Comments    Pt seen for stair management training to address threshold step to enter home environment. Pt greatly limited during session due to 10/10 pain. Pt tearful throughout session and concerned about D/C plan today due to poor pain management. Pt required min (A) for threshold management. Boyfriend present for stair education session to (A) pt upon D/C. Cont to recommend HHPT and bari walker upon D/C.   Follow Up Recommendations  Home health PT;Supervision/Assistance - 24 hour     Equipment Recommendations  Rolling walker with 5" wheels;Other (comment) (bari walker)    Recommendations for Other Services       Precautions / Restrictions Precautions Precautions: Fall Precaution Comments: direct anterior approach. Restrictions Weight Bearing Restrictions: No RLE Weight Bearing: Weight bearing as tolerated    Mobility  Bed Mobility               General bed mobility comments: up in chair   Transfers Overall transfer level: Needs assistance Equipment used: Rolling walker (2 wheeled) Transfers: Sit to/from Stand Sit to Stand: Min guard         General transfer comment: performed sit <> stand x 2; incr time to perform; min guard to steady; heavy anterior lean on rolling walker   Ambulation/Gait Ambulation/Gait assistance: Min guard Ambulation Distance (Feet): 60 Feet (40', 20' ) Assistive device: Rolling walker (2 wheeled) Gait Pattern/deviations: Step-through pattern;Decreased stride length;Decreased step length - right;Antalgic;Wide base of support;Trunk flexed Gait velocity: decreased Gait velocity interpretation: Below normal speed for age/gender General Gait Details: Pt with difficulty advancing Rt  LE; slides Rt LE fwd; heavy anterior lean on RW; cues for gait sequencing and upright posture    Stairs Stairs: Yes Stairs assistance: Min assist Stair Management: No rails;Step to pattern;Forwards;With walker Number of Stairs: 1 General stair comments: educated on multiple techniques for threshold management; boyfriend present for family education; min (A); pt nervous and anxious with stair management   Wheelchair Mobility    Modified Rankin (Stroke Patients Only)       Balance Overall balance assessment: Needs assistance Sitting-balance support: Feet supported;No upper extremity supported Sitting balance-Leahy Scale: Normal     Standing balance support: During functional activity;Bilateral upper extremity supported Standing balance-Leahy Scale: Poor Standing balance comment: UE supported at all times heavy lean on RW                    Cognition Arousal/Alertness: Awake/alert Behavior During Therapy: [facility] for tasks assessed/performed Overall Cognitive Status: Within Functional Limits for tasks assessed                      Exercises Total Joint Exercises Ankle Circles/Pumps: AROM;Both;10 reps;Seated    General Comments        Pertinent Vitals/Pain Pain Assessment: 0-10 Pain Score: 10-Worst pain ever Pain Location: Rt hip Pain Descriptors / Indicators: Crying Pain Intervention(s): Limited activity within patient's tolerance;Monitored during session;Premedicated before session;Repositioned    Home Living                      Prior Function            PT Goals (current goals can now be found in the care plan section)  Acute Rehab PT Goals Patient Stated Goal: pt tearful due to pain throughout session; " i dont know if i can go home today"  PT Goal Formulation: With patient Time For Goal Achievement: 12/04/14 Potential to Achieve Goals: Good Progress towards PT goals: Progressing toward goals    Frequency  BID    PT Plan Current  plan remains appropriate    Co-evaluation             End of Session Equipment Utilized During Treatment: Gait belt Activity Tolerance: Patient limited by pain Patient left: in chair;with call bell/phone within reach;with family/visitor present     Time: 0855-0920 PT Time Calculation (min) (ACUTE ONLY): 25 min  Charges:  [employer] Training: 23-37 mins                    G CodesGustavus Bryant PT [phone] 11/22/2014, 10:21 AM

## 2014-11-22 NOTE — Progress Notes (Signed)
Initial Nutrition Assessment  DOCUMENTATION CODES:  Morbid obesity  INTERVENTION:  Prostat once daily, each supplement provides 100 kcal and 15 grams of protein.   Encourage adequate PO intake.   NUTRITION DIAGNOSIS:  Increased nutrient needs related to  (s/p surgery) as evidenced by estimated needs.  GOAL:  Patient will meet greater than or equal to 90% of their needs  MONITOR:  PO intake, Supplement acceptance, Weight trends, Labs, I & O's  REASON FOR ASSESSMENT:  Other (Comment) (Phone call regarding need for protein supplements)    ASSESSMENT: Pt with PMH DM2 and a history of pain and functional disability in the right hip(s) due to arthritis. S/p laparoscopic sleeve gastrectomy 2 months ago.  PROCEDURE (7/5): RIGHT TOTAL HIP ARTHROPLASTY ANTERIOR APPROACH  Pt reports appetite is good currently and PTA with no other difficulties. Meal completion has been 50%. Pt reports she usually consumes Premier protein shakes and thus has been consuming them while admitted (gets shakes brought in by family). Pt had Unjury ordered however reports she does not like the taste. Pt is agreeable to Prostat. RD to order. Per Epic weight records, pt with a 12.6% weight loss in 4 months.  Pt with no significant fat or muscle mass loss.   Labs and medications reviewed.   Height:  Ht Readings from Last 1 Encounters:  11/20/14 5\' 8"  (1.727 m)    Weight:  Wt Readings from Last 1 Encounters:  11/20/14 317 lb (143.79 kg)    Ideal Body Weight:  63.6 kg  Wt Readings from Last 10 Encounters:  11/20/14 317 lb (143.79 kg)  11/08/14 317 lb 9.6 oz (144.062 kg)  10/09/14 331 lb 8 oz (150.367 kg)  09/24/14 350 lb (158.759 kg)  09/21/14 353 lb (160.12 kg)  09/17/14 351 lb 8 oz (159.439 kg)  09/04/14 360 lb (163.295 kg)  08/07/14 361 lb (163.749 kg)  07/26/14 363 lb (164.656 kg)  05/22/14 353 lb 6.4 oz (160.301 kg)    BMI:  Body mass index is 48.21 kg/(m^2). Morbid  obesity  Estimated Nutritional Needs:  Kcal:  1700-1900  Protein:  at least 80 grams  Fluid:  1.7 - 1.9 L/day  Skin:  Wound (see comment) (Incision on R hip, incision on abdomen, non-pitting RLE edema)  Diet Order:  Diet Carb Modified Fluid consistency:: Thin; Room service appropriate?: Yes  EDUCATION NEEDS:  No education needs identified at this time   Intake/Output Summary (Last 24 hours) at 11/22/14 1513 Last data filed at 11/22/14 0508  Gross per 24 hour  Intake    720 ml  Output      0 ml  Net    720 ml    Last BM:  7/4  Corrin Parker, MS, RD, LDN Pager # [phone] After hours/ weekend pager # [phone]

## 2014-11-22 NOTE — Progress Notes (Signed)
[facility] for warfarin Indication: VTE prophylaxis  Allergies  Allergen Reactions  . Lactose Intolerance (Gi)   . Penicillin G Hives    hives  . Prochlorperazine Itching and Anxiety    Patient Measurements: Height: 5\' 8"  (172.7 cm) Weight: (!) 317 lb (143.79 kg) IBW/kg (Calculated) : 63.9   Vital Signs: Temp: 99.2 F (37.3 C) (07/07 0546) BP: 114/49 mmHg (07/07 0546) Pulse Rate: 89 (07/07 0546)  Labs:  Recent Labs  11/20/14 1635 11/21/14 0604 11/22/14 0410  HGB 12.0 10.4* 9.6*  HCT 36.8 31.2* 29.1*  PLT 355 262 273  LABPROT  --  15.1 15.4*  INR  --  1.17 1.20  CREATININE 0.74 0.70  --     Estimated Creatinine Clearance: 128.8 mL/min (by C-G formula based on Cr of 0.7).  Assessment: Alice Simpson s/p total right hip to be on warfarin for VTE prophylaxis for 4 weeks. Lovenox 40mg  subQ also started, to be stopped when INR >/= 1.8 per ortho. INR today is 1.2 Post-op ABLA, Hgb 9.6, plts 273- no overt bleeding noted.  Goal of Therapy:  INR 2-3 Monitor platelets by anticoagulation protocol: Yes   Plan:   -warfarin 7.5mg  po x1 tonight -recommend increasing Lovenox to 0.5mg /kg (70mg ) subQ q24h in patient with BMI >30 -Lovenox until INR is >/= 1.8 per orders -daily INR -follow for s/s bleeding -education complete  Keiva Dina D. Ashtian Villacis, PharmD, BCPS Clinical Pharmacist Pager: [phone] 11/22/2014 11:51 AM

## 2014-11-22 NOTE — Progress Notes (Signed)
Subjective: 2 Days Post-Op Procedure(s) (LRB): TOTAL HIP ARTHROPLASTY ANTERIOR APPROACH (Right)  Activity level:  wbat Diet tolerance:  ok Voiding:  ok Patient reports pain as moderate.    Objective: Vital signs in last 24 hours: Temp:  [98.5 F (36.9 C)-99.2 F (37.3 C)] 99.2 F (37.3 C) (07/07 0546) Pulse Rate:  [86-89] 89 (07/07 0546) Resp:  [18] 18 (07/07 0546) BP: (110-114)/(49-59) 114/49 mmHg (07/07 0546) SpO2:  [97 %] 97 % (07/07 0546)  Labs:  Recent Labs  11/20/14 1635 11/21/14 0604 11/22/14 0410  HGB 12.0 10.4* 9.6*    Recent Labs  11/21/14 0604 11/22/14 0410  WBC 12.1* 13.6*  RBC 3.85* 3.60*  HCT 31.2* 29.1*  PLT 262 273    Recent Labs  11/20/14 1635 11/21/14 0604  NA  --  135  K  --  4.2  CL  --  100*  CO2  --  28  BUN  --  <5*  CREATININE 0.74 0.70  GLUCOSE  --  150*  CALCIUM  --  8.7*    Recent Labs  11/21/14 0604 11/22/14 0410  INR 1.17 1.20    Physical Exam:  Neurologically intact ABD soft Neurovascular intact Sensation intact distally Intact pulses distally Dorsiflexion/Plantar flexion intact Incision: dressing C/D/I and no drainage No cellulitis present Compartment soft  Assessment/Plan:  2 Days Post-Op Procedure(s) (LRB): TOTAL HIP ARTHROPLASTY ANTERIOR APPROACH (Right) Advance diet Up with therapy Plan for discharge tomorrow Discharge home with home health if doing well and cleared by PT. Follow up in office 2 weeks post op. Continue on  lovenox bridge toCoumadin for DVT prevention. Will change oxy IR to every 3 hrs prn pain   Darelle Kings, Larwance Sachs 11/22/2014, 2:49 PM

## 2014-11-23 ENCOUNTER — Inpatient Hospital Stay (HOSPITAL_COMMUNITY): Payer: Medicaid Other

## 2014-11-23 LAB — CBC
HCT: 28.1 % — ABNORMAL LOW (ref 36.0–46.0)
HEMOGLOBIN: 9.2 g/dL — AB (ref 12.0–15.0)
MCH: 26.7 pg (ref 26.0–34.0)
MCHC: 32.7 g/dL (ref 30.0–36.0)
MCV: 81.7 fL (ref 78.0–100.0)
Platelets: 275 10*3/uL (ref 150–400)
RBC: 3.44 MIL/uL — ABNORMAL LOW (ref 3.87–5.11)
RDW: 14.4 % (ref 11.5–15.5)
WBC: 12 10*3/uL — ABNORMAL HIGH (ref 4.0–10.5)

## 2014-11-23 LAB — PROTIME-INR
INR: 1.34 (ref 0.00–1.49)
Prothrombin Time: 16.7 seconds — ABNORMAL HIGH (ref 11.6–15.2)

## 2014-11-23 LAB — GLUCOSE, CAPILLARY: GLUCOSE-CAPILLARY: 116 mg/dL — AB (ref 65–99)

## 2014-11-23 MED ORDER — OXYCODONE HCL 5 MG/5ML PO SOLN: 5.0000 mg | ORAL | Status: DC | PRN

## 2014-11-23 MED ORDER — ENOXAPARIN SODIUM 80 MG/0.8ML ~~LOC~~ SOLN
70.0000 mg | SUBCUTANEOUS | Status: DC
  Administered 2014-11-24: 70 mg via SUBCUTANEOUS
  Filled 2014-11-23 (×2): qty 0.8

## 2014-11-23 MED ORDER — WARFARIN SODIUM 7.5 MG PO TABS
7.5000 mg | ORAL_TABLET | Freq: Once | ORAL | Status: AC
  Administered 2014-11-23: 7.5 mg via ORAL
  Filled 2014-11-23: qty 1

## 2014-11-23 MED ORDER — WARFARIN SODIUM 7.5 MG PO TABS: 7.5000 mg | ORAL_TABLET | Freq: Once | ORAL | Status: DC

## 2014-11-23 MED ORDER — ENOXAPARIN SODIUM 80 MG/0.8ML ~~LOC~~ SOLN: 70.0000 mg | SUBCUTANEOUS | Status: DC

## 2014-11-23 MED ORDER — METHOCARBAMOL 500 MG PO TABS: 500.0000 mg | ORAL_TABLET | Freq: Four times a day (QID) | ORAL | Status: DC | PRN

## 2014-11-23 NOTE — Progress Notes (Signed)
Occupational Therapy Treatment Patient Details Name: TALON WITTING MRN: [id number] DOB: [birth year] Today's Date: 11/23/2014    History of present illness Patient is a 49 y/o female s/p R THA, direct anterior approach. PMH includes HTN, depression, anxiety.   OT comments  Pt. Making slow gains.  Remains motivated to increase mobility but is limited with B LE weakness making her a fall risk.  Agree with PT for intense continued therapies prior to d/c home to ensure the safety and independence she will need.   Follow Up Recommendations  Home health OT;Supervision/Assistance - 24 hour;CIR    Equipment Recommendations  3 in 1 bedside comode;Tub/shower bench    Recommendations for Other Services      Precautions / Restrictions Precautions Precautions: Fall Precaution Comments: direct anterior approach. Restrictions RLE Weight Bearing: Weight bearing as tolerated       Mobility Bed Mobility               General bed mobility comments: in recliner upon arrival to room  Transfers Overall transfer level: Needs assistance Equipment used: Rolling walker (2 wheeled) Transfers: Sit to/from [facility] Sit to Stand: Min assist Stand pivot transfers: Min assist       General transfer comment: slow to rise, requires cues for safety and also to decrease anxiety that presents as she fatigues    Balance                                   ADL Overall ADL's : Needs assistance/impaired     Grooming: Wash/dry hands;Min guard;Standing                   Toilet Transfer: Minimal assistance;BSC;Ambulation;RW   Toileting- Water quality scientist and Hygiene: Min guard;Sit to/from stand       Functional mobility during ADLs: Minimal assistance;Rolling walker General ADL Comments: pt. making slow gains, remains limited by demonstrated poor B LE strength during ambulation      Vision                     Perception     Praxis       Cognition                             Extremity/Trunk Assessment               Exercises     Shoulder Instructions       General Comments      Pertinent Vitals/ Pain       Pain Assessment: 0-10 Pain Score: 8  Pain Location: right hip Pain Descriptors / Indicators: Aching Pain Intervention(s): Patient requesting pain meds-RN notified;RN gave pain meds during session  Home Living                                          Prior Functioning/Environment              Frequency Min 2X/week     Progress Toward Goals  OT Goals(current goals can now be found in the care plan section)  Progress towards OT goals: Progressing toward goals     Plan Discharge plan remains appropriate;Discharge plan needs to be updated    Co-evaluation  End of Session Equipment Utilized During Treatment: Gait belt;Rolling walker   Activity Tolerance Patient tolerated treatment well   Patient Left in chair;with call bell/phone within reach   Nurse Communication          Time: 8786-7672 OT Time Calculation (min): 30 min  Charges: OT General Charges $OT Visit: 1 Procedure OT Treatments $Self Care/Home Management : 23-37 mins  Janice Coffin, COTA/L 11/23/2014, 9:37 AM

## 2014-11-23 NOTE — Progress Notes (Signed)
Subjective: 3 Days Post-Op Procedure(s) (LRB): TOTAL HIP ARTHROPLASTY ANTERIOR APPROACH (Right)   patietn is resting comfortably in bed this morning. She is still struggling with ambulation and motility. She is very nervous about going home as she will nto have 24hr supervision available. We are wondering about shout term SNF placement to help with her ambulation and to also help monitor her coumadin levels.    Activity level:  wbat Diet tolerance:  ok Voiding:  ok Patient reports pain as moderate.    Objective: Vital signs in last 24 hours: Temp:  [98.8 F (37.1 C)-99.7 F (37.6 C)] 98.8 F (37.1 C) (07/08 0523) Pulse Rate:  [57-79] 78 (07/08 0523) Resp:  [16-18] 18 (07/08 0523) BP: (109-143)/(50-73) 109/50 mmHg (07/08 0523) SpO2:  [96 %-99 %] 99 % (07/08 0523)  Labs:  Recent Labs  11/20/14 1635 11/21/14 0604 11/22/14 0410 11/23/14 0450  HGB 12.0 10.4* 9.6* 9.2*    Recent Labs  11/22/14 0410 11/23/14 0450  WBC 13.6* 12.0*  RBC 3.60* 3.44*  HCT 29.1* 28.1*  PLT 273 275    Recent Labs  11/20/14 1635 11/21/14 0604  NA  --  135  K  --  4.2  CL  --  100*  CO2  --  28  BUN  --  <5*  CREATININE 0.74 0.70  GLUCOSE  --  150*  CALCIUM  --  8.7*    Recent Labs  11/22/14 0410 11/23/14 0450  INR 1.20 1.34    Physical Exam:  Neurologically intact ABD soft Neurovascular intact Sensation intact distally Intact pulses distally Dorsiflexion/Plantar flexion intact Incision: dressing C/D/I and no drainage No cellulitis present Compartment soft  Assessment/Plan:  3 Days Post-Op Procedure(s) (LRB): TOTAL HIP ARTHROPLASTY ANTERIOR APPROACH (Right) Advance diet Up with therapy  We are trying to decide about home with home health and PT vs. Placement to SNF due to her lack of ambulation and Lack of 24hr care and supervision. Continue on coumadin for DVT prevention.  We will stop by this afternoon to see her to check on progress today and to determine where  she will go upon discharge. Follow up in office 2 weeks post op.   Kreig Parson, Larwance Sachs 11/23/2014, 7:22 AM

## 2014-11-23 NOTE — Progress Notes (Signed)
[facility] for warfarin Indication: VTE prophylaxis  Allergies  Allergen Reactions  . Lactose Intolerance (Gi)   . Penicillin G Hives    hives  . Prochlorperazine Itching and Anxiety    Patient Measurements: Height: 5\' 8"  (172.7 cm) Weight: (!) 317 lb (143.79 kg) IBW/kg (Calculated) : 63.9   Vital Signs: Temp: 98.8 F (37.1 C) (07/08 0523) BP: 109/50 mmHg (07/08 0523) Pulse Rate: 78 (07/08 0523)  Labs:  Recent Labs  11/20/14 1635 11/21/14 0604 11/22/14 0410 11/23/14 0450  HGB 12.0 10.4* 9.6* 9.2*  HCT 36.8 31.2* 29.1* 28.1*  PLT 355 262 273 275  LABPROT  --  15.1 15.4* 16.7*  INR  --  1.17 1.20 1.34  CREATININE 0.74 0.70  --   --     Estimated Creatinine Clearance: 128.8 mL/min (by C-G formula based on Cr of 0.7).  Assessment: 27 YOF s/p total right hip to be on warfarin for VTE prophylaxis for 4 weeks. Lovenox 40mg  subQ also started, to be stopped when INR >/= 1.8 per ortho. INR today is 1.34 Post-op ABLA, Hgb 9.2, plts 275- no overt bleeding noted.  Goal of Therapy:  INR 2-3 Monitor platelets by anticoagulation protocol: Yes   Plan:   -noted patient may be discharging today. Recommend warfarin 7.5mg  on 7/8, 7/9 and 7/10 with an INR check on 7/11 either at SNF or outpatient facility depending on what disposition plan patient chooses. -recommend increasing Lovenox to 0.5mg /kg (70mg ) subQ q24h in patient with BMI >30 -Lovenox until INR is >/= 1.8 per orders -daily INR while in hospital -follow for s/s bleeding  Emiline Mancebo D. Hakeen Shipes, PharmD, BCPS Clinical Pharmacist Pager: [phone] 11/23/2014 10:00 AM

## 2014-11-23 NOTE — Progress Notes (Signed)
PT Cancellation Note  Patient Details Name: Alice Simpson MRN: [id number] DOB: Feb 14, 1966   Cancelled Treatment:    Reason Eval/Treat Not Completed: Fatigue/lethargy limiting ability to participate;Pain limiting ability to participate. Will see in AM prior to [place], Tonia Brooms 11/23/2014, 2:07 PM

## 2014-11-23 NOTE — Care Management Note (Addendum)
Case Management Note  Patient Details  Name: Alice Simpson MRN: [id number] Date of Birth: [birth year]  Subjective/Objective:  Patient is s/p right total hip arthroplasty.                 Action/Plan: Case manager spoke with patient concerning home health and DME needs. Patient was preoperatively setup with [facility]. Case manager contacted Dorina Hoyer, Liaison to confirm.  11/23/14 patient expressed need to got to "rehab" . Case manager spoke with Social worker and it was explained to the patient that medicaid will not cover Hip replacement at SNF.  Case manager also explained that patient will have [place] and PT coming to her home. Patient states her boyfriend will be around some and her mom will be in town on Sunday. Case manager informed Georgette Dover of conversation with patient.   Expected Discharge Date: 11/23/14                 Expected Discharge Plan:  [place]  In-House Referral:  NA  Discharge planning Services  CM Consult  Post Acute Care Choice:  Durable Medical Equipment, Home Health Choice offered to:  Patient  DME Arranged:  3-N-1, Walker rolling DME Agency:  [employer]  HH Arranged:  PT HH Agency:  [facility]  Status of Service:  Completed, signed off  Medicare Important Message Given:    Date Medicare IM Given:    Medicare IM give by:    Date Additional Medicare IM Given:    Additional Medicare Important Message give by:     If discussed at [place] of Stay Meetings, dates discussed:    Additional Comments:  Ninfa Meeker, RN 11/23/2014, 10:19 AM

## 2014-11-23 NOTE — Progress Notes (Signed)
Physical Therapy Treatment Patient Details Name: Alice Simpson MRN: [id number] DOB: [birth year] Today's Date: 11/23/2014    History of Present Illness Patient is a 49 y/o female s/p R THA, direct anterior approach. PMH includes HTN, depression, anxiety.    PT Comments    Patient continues to fatigue easily with mobility and is somewhat self limiting. She was frustrated and voicing concerns regarding overall experience during this surgery. RN is aware. Will notify charge nurse as well. Patient was pleasant with myself and was grateful for therapy involvement. Will continue with current POC at this time. Patient will have 24/7 assistance from her mother for 2 weeks starting Sunday  Follow Up Recommendations  Home health PT;Supervision/Assistance - 24 hour     Equipment Recommendations  Rolling walker with 5" wheels;Other (comment)    Recommendations for Other Services       Precautions / Restrictions Precautions Precautions: Fall Precaution Comments: direct anterior approach. Restrictions Weight Bearing Restrictions: Yes RLE Weight Bearing: Weight bearing as tolerated    Mobility  Bed Mobility           Sit to supine: Min guard   General bed mobility comments: Patient using sheet to lift R LE up onto bed with increased effort and time  Transfers Overall transfer level: Needs assistance Equipment used: Rolling walker (2 wheeled) Transfers: Sit to/from [facility] Sit to Stand: Min guard Stand pivot transfers: Min assist       General transfer comment: Patient with safe technique no cues required.   Ambulation/Gait Ambulation/Gait assistance: Min guard Ambulation Distance (Feet): 30 Feet Assistive device: Rolling walker (2 wheeled) Gait Pattern/deviations: Step-to pattern;Decreased step length - right;Decreased stance time - left;Decreased weight shift to right Gait velocity: decreased   General Gait Details: Decreased RLE advancement due to  quad weakness. Demonstrates adequate hip flexion. COntinues with heavy reliance on RW. Requested patient to attempt to ambulate further but stated she was unable   Stairs         General stair comments: declined practice this morning  Wheelchair Mobility    Modified Rankin (Stroke Patients Only)       Balance                                    Cognition Arousal/Alertness: Awake/alert Behavior During Therapy: WFL for tasks assessed/performed Overall Cognitive Status: Within Functional Limits for tasks assessed                      Exercises      General Comments        Pertinent Vitals/Pain Pain Assessment: 0-10 Pain Score: 8  Pain Location: R hip Pain Descriptors / Indicators: Aching;Sore Pain Intervention(s): Monitored during session    Home Living                      Prior Function            PT Goals (current goals can now be found in the care plan section) Progress towards PT goals: Progressing toward goals    Frequency  BID    PT Plan Current plan remains appropriate    Co-evaluation             End of Session   Activity Tolerance: Patient limited by fatigue Patient left: in bed;with call bell/phone within reach     Time: 1324-4010 PT Time Calculation (  min) (ACUTE ONLY): 42 min  Charges:  $Gait Training: 8-22 mins $Therapeutic Activity: 23-37 mins                    G Codes:      Jacqualyn Posey 11/23/2014, 11:35 AM 11/23/2014 Jacqualyn Posey PTA [phone] pager [phone] office

## 2014-11-24 LAB — PROTIME-INR
INR: 1.44 (ref 0.00–1.49)
Prothrombin Time: 17.6 seconds — ABNORMAL HIGH (ref 11.6–15.2)

## 2014-11-24 NOTE — Progress Notes (Signed)
[facility] for warfarin Indication: VTE prophylaxis  Allergies  Allergen Reactions  . Lactose Intolerance (Gi)   . Penicillin G Hives    hives  . Prochlorperazine Itching and Anxiety    Patient Measurements: Height: 5\' 8"  (172.7 cm) Weight: (!) 317 lb (143.79 kg) IBW/kg (Calculated) : 63.9   Vital Signs: Temp: 98.1 F (36.7 C) (07/09 0548) Temp Source: Oral (07/09 0548) BP: 125/68 mmHg (07/09 0548) Pulse Rate: 75 (07/09 0548)  Labs:  Recent Labs  11/22/14 0410 11/23/14 0450 11/24/14 0346  HGB 9.6* 9.2*  --   HCT 29.1* 28.1*  --   PLT 273 275  --   LABPROT 15.4* 16.7* 17.6*  INR 1.20 1.34 1.44    Estimated Creatinine Clearance: 128.8 mL/min (by C-G formula based on Cr of 0.7).  Assessment: 78 YOF s/p total right hip to be on warfarin for VTE prophylaxis for 4 weeks. Lovenox 40mg  subQ also started, to be stopped when INR >/= 1.8 per ortho. INR today is 1.44 Post-op ABLA, Hgb 9.2, plts 275- no overt bleeding noted.  Goal of Therapy:  INR 2-3 Monitor platelets by anticoagulation protocol: Yes   Plan:   -noted patient likely discharging today. Recommend warfarin 7.5mg  on 7/9 and 7/10 with an INR check on 7/11 either at SNF or outpatient facility depending on what disposition plan patient chooses. *inpatient warfarin orders NOT entered for tonight as discharge orders are done. If patient does not discharge, please call pharmacy to enter dose -Lovenox 70mg  subQ q24h until INR is >/= 1.8 per orders -daily INR while in hospital -follow for s/s bleeding  Shannette Tabares D. Shantasia Hunnell, PharmD, BCPS Clinical Pharmacist Pager: [phone] 11/24/2014 7:39 AM

## 2014-11-24 NOTE — Discharge Summary (Signed)
Physician Discharge Summary  Patient ID: Alice Simpson MRN: [id number] DOB/AGE: Mar 11, 1966 49 y.o.  Admit date: 11/20/2014 Discharge date: 11/24/2014  Admission Diagnoses:  Primary osteoarthritis of right hip  Discharge Diagnoses:  Principal Problem:   Primary osteoarthritis of right hip Active Problems:   Type 2 diabetes mellitus, uncontrolled   Morbid obesity   Past Medical History  Diagnosis Date  . Diabetes mellitus without complication   . Hyperlipidemia   . Depression   . Anxiety   . Migraine   . Osteoarthritis   . Neuropathy   . PCOS (polycystic ovarian syndrome)     Surgeries: Procedure(s): TOTAL HIP ARTHROPLASTY ANTERIOR APPROACH on 11/20/2014   Consultants (if any):    Discharged Condition: Improved  Hospital Course: Alice Simpson is an 49 y.o. female who was admitted 11/20/2014 with a diagnosis of Primary osteoarthritis of right hip and went to the operating room on 11/20/2014 and underwent the above named procedures.    She was given perioperative antibiotics:  Anti-infectives    Start     Dose/Rate Route Frequency Ordered Stop   11/20/14 2130  vancomycin (VANCOCIN) IVPB 1000 mg/200 mL premix     1,000 mg 200 mL/hr over 60 Minutes Intravenous Every 12 hours 11/20/14 1536 11/20/14 2357   11/20/14 1000  vancomycin (VANCOCIN) 1,500 mg in sodium chloride 0.9 % 500 mL IVPB     1,500 mg 250 mL/hr over 120 Minutes Intravenous On call to O.R. 11/19/14 1314 11/20/14 1023   11/20/14 0600  vancomycin (VANCOCIN) 1,500 mg in sodium chloride 0.9 % 500 mL IVPB  Status:  Discontinued     1,500 mg 250 mL/hr over 120 Minutes Intravenous On call to O.R. 11/19/14 1314 11/19/14 1315    .  She was given sequential compression devices, early ambulation, and ASA for DVT prophylaxis.  She benefited maximally from the hospital stay and there were no complications.    Recent vital signs:  Filed Vitals:   11/24/14 0548  BP: 125/68  Pulse: 75  Temp: 98.1 F (36.7 C)  Resp:  16    Recent laboratory studies:  Lab Results  Component Value Date   HGB 9.2* 11/23/2014   HGB 9.6* 11/22/2014   HGB 10.4* 11/21/2014   Lab Results  Component Value Date   WBC 12.0* 11/23/2014   PLT 275 11/23/2014   Lab Results  Component Value Date   INR 1.44 11/24/2014   Lab Results  Component Value Date   NA 135 11/21/2014   K 4.2 11/21/2014   CL 100* 11/21/2014   CO2 28 11/21/2014   BUN <5* 11/21/2014   CREATININE 0.70 11/21/2014   GLUCOSE 150* 11/21/2014    Discharge Medications:     Medication List    STOP taking these medications        HYDROcodone-acetaminophen 5-325 MG per tablet  Commonly known as:  NORCO/VICODIN      TAKE these medications        acetaminophen 500 MG tablet  Commonly known as:  TYLENOL  Take 500 mg by mouth daily as needed for moderate pain.     ALPRAZolam 0.5 MG tablet  Commonly known as:  XANAX  Take 0.5 mg by mouth daily as needed for anxiety.     buPROPion 150 MG 24 hr tablet  Commonly known as:  WELLBUTRIN XL  Take 150 mg by mouth every morning.     CALCIUM CITRATE + PO  Take 600 mg by mouth 3 (three) times daily.  cyanocobalamin 500 MCG tablet  Take 500 mcg by mouth every morning.     diazepam 10 MG tablet  Commonly known as:  VALIUM  Take 1 tablet by mouth. Take one the night before, the morning an hour before the procedure and then an extra to take when the procedure starts if pt. Feels she needs it.  Before Cortisone Shots in doctors office.     enoxaparin 80 MG/0.8ML injection  Commonly known as:  LOVENOX  Inject 0.7 mLs (70 mg total) into the skin daily.     FLUoxetine 40 MG capsule  Commonly known as:  PROZAC  Take 40 mg by mouth at bedtime.     LACTAID PO  Take 1 tablet by mouth daily as needed (before eating d).     metaxalone 800 MG tablet  Commonly known as:  SKELAXIN  Take 800 mg by mouth 2 (two) times daily as needed for muscle spasms.     metFORMIN 500 MG 24 hr tablet  Commonly known as:   GLUCOPHAGE-XR  Take 1 tablet (500 mg total) by mouth 2 (two) times daily.     methocarbamol 500 MG tablet  Commonly known as:  ROBAXIN  Take 1 tablet (500 mg total) by mouth every 6 (six) hours as needed for muscle spasms.     MULTI-VITAMINS Tabs  Take 1 tablet by mouth 2 (two) times daily.     norethindrone 0.35 MG tablet  Commonly known as:  MICRONOR,CAMILA,ERRIN  Take 1 tablet (0.35 mg total) by mouth daily.     ondansetron 4 MG tablet  Commonly known as:  ZOFRAN  Take 4 mg by mouth daily as needed for nausea or vomiting.     oxyCODONE 5 MG/5ML solution  Commonly known as:  ROXICODONE  Take 5-10 mLs (5-10 mg total) by mouth every 3 (three) hours as needed for moderate pain or severe pain.     PRESCRIPTION MEDICATION  Inject 1 each into the skin once. Cortisone Shots in Spine, SI joint, and Hip.     SF 5000 PLUS 1.1 % Crea dental cream  Generic drug:  sodium fluoride  Take 1 application by mouth 2 (two) times daily.     simvastatin 40 MG tablet  Commonly known as:  ZOCOR  Take 40 mg by mouth every evening.     sitaGLIPtin 100 MG tablet  Commonly known as:  JANUVIA  Take 1 tablet (100 mg total) by mouth daily.     TRAVEL SICKNESS 25 MG Chew  Generic drug:  Meclizine HCl  Chew 25 mg by mouth 2 (two) times daily as needed (dizziness).     warfarin 7.5 MG tablet  Commonly known as:  COUMADIN  Take 1 tablet (7.5 mg total) by mouth one time only at 6 PM.        Diagnostic Studies: Dg Pelvis 1-2 Views  11/23/2014   CLINICAL DATA:  Hip replacement.  EXAM: PELVIS - 1-2 VIEW  COMPARISON:  None.  FINDINGS: Total right hip replacement. Right hip prosthesis is intact. No acute bony or joint abnormality identified.  IMPRESSION: Total right hip replacement.  No acute abnormality.   Electronically Signed   By: Marcello Moores  Register   On: 11/23/2014 15:16   Dg Hip Operative Unilat With Pelvis Right  11/20/2014   CLINICAL DATA:  Patient status post total right hip arthroplasty.  EXAM:  OPERATIVE RIGHT HIP (WITH PELVIS IF PERFORMED) 2 VIEWS  TECHNIQUE: Fluoroscopic spot image(s) were submitted for interpretation post-operatively.  COMPARISON:  None.  FINDINGS: Two fluoroscopic intraoperative images were presented for evaluation of the patient status post total right hip arthroplasty. Hardware appears intact.  IMPRESSION: Patient status post total right hip arthroplasty.   Electronically Signed   By: Lovey Newcomer M.D.   On: 11/20/2014 14:20    Disposition: 01-Home or Self Care      Discharge Instructions    Call MD / Call 911    Complete by:  As directed   If you experience chest pain or shortness of breath, CALL 911 and be transported to the hospital emergency room.  If you develope a fever above 101 F, pus (white drainage) or increased drainage or redness at the wound, or calf pain, call your surgeon's office.     Constipation Prevention    Complete by:  As directed   Drink plenty of fluids.  Prune juice may be helpful.  You may use a stool softener, such as Colace (over the counter) 100 mg twice a day.  Use MiraLax (over the counter) for constipation as needed.     Diet - low sodium heart healthy    Complete by:  As directed      Discharge instructions    Complete by:  As directed   INSTRUCTIONS AFTER JOINT REPLACEMENT   Remove items at home which could result in a fall. This includes throw rugs or furniture in walking pathways ICE to the affected joint every three hours while awake for 30 minutes at a time, for at least the first 3-5 days, and then as needed for pain and swelling.  Continue to use ice for pain and swelling. You may notice swelling that will progress down to the foot and ankle.  This is normal after surgery.  Elevate your leg when you are not up walking on it.   Continue to use the breathing machine you got in the hospital (incentive spirometer) which will help keep your temperature down.  It is common for your temperature to cycle up and down following  surgery, especially at night when you are not up moving around and exerting yourself.  The breathing machine keeps your lungs expanded and your temperature down.   DIET:  As you were doing prior to hospitalization, we recommend a well-balanced diet.  DRESSING / WOUND CARE / SHOWERING  You may shower 3 days after surgery, but keep the wounds dry during showering.  You may use an occlusive plastic wrap (Press'n Seal for example), NO SOAKING/SUBMERGING IN THE BATHTUB.  If the bandage gets wet, change with a clean dry gauze.  If the incision gets wet, pat the wound dry with a clean towel.  ACTIVITY  Increase activity slowly as tolerated, but follow the weight bearing instructions below.   No driving for 6 weeks or until further direction given by your physician.  You cannot drive while taking narcotics.  No lifting or carrying greater than 10 lbs. until further directed by your surgeon. Avoid periods of inactivity such as sitting longer than an hour when not asleep. This helps prevent blood clots.  You may return to work once you are authorized by your doctor.     WEIGHT BEARING   Weight bearing as tolerated with assist device (walker, cane, etc) as directed, use it as long as suggested by your surgeon or therapist, typically at least 4-6 weeks.   EXERCISES  Results after joint replacement surgery are often greatly improved when you follow the exercise, range of motion and muscle strengthening exercises  prescribed by your doctor. Safety measures are also important to protect the joint from further injury. Any time any of these exercises cause you to have increased pain or swelling, decrease what you are doing until you are comfortable again and then slowly increase them. If you have problems or questions, call your caregiver or physical therapist for advice.   Rehabilitation is important following a joint replacement. After just a few days of immobilization, the muscles of the leg can become  weakened and shrink (atrophy).  These exercises are designed to build up the tone and strength of the thigh and leg muscles and to improve motion. Often times heat used for twenty to thirty minutes before working out will loosen up your tissues and help with improving the range of motion but do not use heat for the first two weeks following surgery (sometimes heat can increase post-operative swelling).   These exercises can be done on a training (exercise) mat, on the floor, on a table or on a bed. Use whatever works the best and is most comfortable for you.    Use music or television while you are exercising so that the exercises are a pleasant break in your day. This will make your life better with the exercises acting as a break in your routine that you can look forward to.   Perform all exercises about fifteen times, three times per day or as directed.  You should exercise both the operative leg and the other leg as well.   Exercises include:   Quad Sets - Tighten up the muscle on the front of the thigh (Quad) and hold for 5-10 seconds.   Straight Leg Raises - With your knee straight (if you were given a brace, keep it on), lift the leg to 60 degrees, hold for 3 seconds, and slowly lower the leg.  Perform this exercise against resistance later as your leg gets stronger.  Leg Slides: Lying on your back, slowly slide your foot toward your buttocks, bending your knee up off the floor (only go as far as is comfortable). Then slowly slide your foot back down until your leg is flat on the floor again.  Angel Wings: Lying on your back spread your legs to the side as far apart as you can without causing discomfort.  Hamstring Strength:  Lying on your back, push your heel against the floor with your leg straight by tightening up the muscles of your buttocks.  Repeat, but this time bend your knee to a comfortable angle, and push your heel against the floor.  You may put a pillow under the heel to make it more  comfortable if necessary.   A rehabilitation program following joint replacement surgery can speed recovery and prevent re-injury in the future due to weakened muscles. Contact your doctor or a physical therapist for more information on knee rehabilitation.    CONSTIPATION  Constipation is defined medically as fewer than three stools per week and severe constipation as less than one stool per week.  Even if you have a regular bowel pattern at home, your normal regimen is likely to be disrupted due to multiple reasons following surgery.  Combination of anesthesia, postoperative narcotics, change in appetite and fluid intake all can affect your bowels.   YOU MUST use at least one of the following options; they are listed in order of increasing strength to get the job done.  They are all available over the counter, and you may need to use some,  POSSIBLY even all of these options:    Drink plenty of fluids (prune juice may be helpful) and high fiber foods Colace 100 mg by mouth twice a day  Senokot for constipation as directed and as needed Dulcolax (bisacodyl), take with full glass of water  Miralax (polyethylene glycol) once or twice a day as needed.  If you have tried all these things and are unable to have a bowel movement in the first 3-4 days after surgery call either your surgeon or your primary doctor.    If you experience loose stools or diarrhea, hold the medications until you stool forms back up.  If your symptoms do not get better within 1 week or if they get worse, check with your doctor.  If you experience "the worst abdominal pain ever" or develop nausea or vomiting, please contact the office immediately for further recommendations for treatment.   ITCHING:  If you experience itching with your medications, try taking only a single pain pill, or even half a pain pill at a time.  You can also use Benadryl over the counter for itching or also to help with sleep.   TED HOSE STOCKINGS:   Use stockings on both legs until for at least 2 weeks or as directed by physician office. They may be removed at night for sleeping.  MEDICATIONS:  See your medication summary on the "After Visit Summary" that nursing will review with you.  You may have some home medications which will be placed on hold until you complete the course of blood thinner medication.  It is important for you to complete the blood thinner medication as prescribed.  PRECAUTIONS:  If you experience chest pain or shortness of breath - call 911 immediately for transfer to the hospital emergency department.   If you develop a fever greater that 101 F, purulent drainage from wound, increased redness or drainage from wound, foul odor from the wound/dressing, or calf pain - CONTACT YOUR SURGEON.                                                   FOLLOW-UP APPOINTMENTS:  If you do not already have a post-op appointment, please call the office for an appointment to be seen by your surgeon.  Guidelines for how soon to be seen are listed in your "After Visit Summary", but are typically between 1-4 weeks after surgery.  OTHER INSTRUCTIONS:   Knee Replacement:  Do not place pillow under knee, focus on keeping the knee straight while resting. CPM instructions: 0-90 degrees, 2 hours in the morning, 2 hours in the afternoon, and 2 hours in the evening. Place foam block, curve side up under heel at all times except when in CPM or when walking.  DO NOT modify, tear, cut, or change the foam block in any way.  MAKE SURE YOU:  Understand these instructions.  Get help right away if you are not doing well or get worse.    Thank you for letting us be a part of your medical care team.  It is a privilege we respect greatly.  We hope these instructions will help you stay on track for a fast and full recovery!     Increase activity slowly as tolerated    Complete by:  As directed  Follow-up Information    Follow up with  Hessie Dibble, MD. Schedule an appointment as soon as possible for a visit in 2 weeks.   Specialty:  Orthopedic Surgery   Contact information:   [place] [phone]       Follow up with [place].   Specialty:  [place]   Why:  Someone from [facility] will contact you concerning start date and time for [facility] for coumadin management and for physical therapy.   Contact information:   [place] [phone]        Signed: Grandville Silos, Keltin Baird A. 11/24/2014, 11:00 AM

## 2014-11-24 NOTE — Progress Notes (Signed)
Physical Therapy Treatment Patient Details Name: Alice Simpson MRN: [id number] DOB: June 21, 1965 Today's Date: 11/24/2014    History of Present Illness Patient is a 49 y/o female s/p R THA, direct anterior approach. PMH includes HTN, depression, anxiety.    PT Comments    Patient is moving much better today. ABle to practice steps again. Patient safe to D/C from a mobility standpoint based on progression towards goals set on PT eval.    Follow Up Recommendations  Home health PT;Supervision/Assistance - 24 hour     Equipment Recommendations  Rolling walker with 5" wheels;Other (comment)    Recommendations for Other Services       Precautions / Restrictions Precautions Precautions: Fall Precaution Comments: direct anterior approach. Restrictions RLE Weight Bearing: Weight bearing as tolerated    Mobility  Bed Mobility Overal bed mobility: Needs Assistance Bed Mobility: Supine to Sit;Sit to Supine     Supine to sit: Supervision        Transfers Overall transfer level: Needs assistance Equipment used: Rolling walker (2 wheeled)   Sit to Stand: Supervision         General transfer comment: supervision for safety  Ambulation/Gait Ambulation/Gait assistance: Supervision Ambulation Distance (Feet): 30 Feet Assistive device: Rolling walker (2 wheeled) Gait Pattern/deviations: Decreased stance time - right;Decreased step length - left Gait velocity: decreased   General Gait Details: Better clearance of R LE this session.    Stairs   Stairs assistance: Min guard Stair Management: Step to pattern;Backwards;With walker Number of Stairs: 1 General stair comments: CUes for backwards technique which patient perferred due to pain  Wheelchair Mobility    Modified Rankin (Stroke Patients Only)       Balance                                    Cognition Arousal/Alertness: Awake/alert Behavior During Therapy: WFL for tasks  assessed/performed Overall Cognitive Status: Within Functional Limits for tasks assessed                      Exercises      General Comments        Pertinent Vitals/Pain Pain Score: 5  Pain Location: R hip Pain Descriptors / Indicators: Aching;Sore Pain Intervention(s): Monitored during session    Home Living                      Prior Function            PT Goals (current goals can now be found in the care plan section) Progress towards PT goals: Progressing toward goals    Frequency  BID    PT Plan Current plan remains appropriate    Co-evaluation             End of Session   Activity Tolerance: Patient tolerated treatment well Patient left: in chair;with call bell/phone within reach     Time: 1045-1103 PT Time Calculation (min) (ACUTE ONLY): 18 min  Charges:  $Gait Training: 8-22 mins                    G Codes:      Jacqualyn Posey 11/24/2014, 11:31 AM 11/24/2014 Jacqualyn Posey PTA [phone] pager [phone] office

## 2014-11-24 NOTE — Care Management (Signed)
CM spoke with patient at the bedside. She needs a shower bench due to being unable to get the 3N1 in and out of her shower if she is at home alone. James at [facility] notified of the request. A bariatric shower bench will be delivered to her home. Presenter, broadcasting BSN CCM

## 2014-11-24 NOTE — Progress Notes (Signed)
   11/24/14 1200  OT Visit Information  Last OT Received On 11/24/14  Assistance Needed +1  History of Present Illness Patient is a 49 y/o female s/p R THA, direct anterior approach. PMH includes HTN, depression, anxiety.  OT Time Calculation  OT Start Time (ACUTE ONLY) 1128  OT Stop Time (ACUTE ONLY) 1204  OT Time Calculation (min) 36 min  Precautions  Precautions Fall  Pain Assessment  Pain Assessment Faces  Faces Pain Scale 4  Pain Location R hip  Pain Descriptors / Indicators Sore  Pain Intervention(s) Monitored during session;Premedicated before session;Repositioned  Cognition  Arousal/Alertness Awake/alert  Behavior During Therapy WFL for tasks assessed/performed  Overall Cognitive Status Within Functional Limits for tasks assessed  ADL  Overall ADL's  Needs assistance/impaired  Toilet Transfer Supervision/safety;RW;Ambulation  Toileting- Clothing Manipulation and Hygiene Modified independent;Sit to/from stand  Tub/ Shower Transfer Tub transfer;Tub bench;Minimal assistance  Tub/Shower Transfer Details (indicate cue type and reason) instructed pt and boyfriend in set up of tub bench  General ADL Comments Issued hip kit and reinforced education in use  Bed Mobility  General bed mobility comments pt in chair  Restrictions  RLE Weight Bearing WBAT  Transfers  Equipment used Rolling walker (2 wheeled)  Sit to Stand Supervision  OT - End of Session  Equipment Utilized During Treatment Rolling walker  Activity Tolerance Patient tolerated treatment well  Patient left in chair;with call bell/phone within reach;with family/visitor present  Nurse Communication (ready for d/c)  OT Assessment/Plan  OT Plan Discharge plan remains appropriate;Discharge plan needs to be updated  OT Frequency (ACUTE ONLY) Min 2X/week  Follow Up Recommendations Home health OT;Supervision/Assistance - 24 hour;CIR  OT Equipment 3 in 1 bedside comode;Tub/shower bench  OT Goal Progression  Progress  towards OT goals Progressing toward goals  OT General Charges  $OT Visit 1 Procedure  OT Treatments  $Self Care/Home Management  23-37 mins  11/24/2014 Julie Mayberry, OTR/L Pager: [phone] 

## 2014-11-27 ENCOUNTER — Encounter: Payer: Self-pay | Admitting: Dietician

## 2014-11-27 ENCOUNTER — Encounter: Payer: Medicaid Other | Attending: Internal Medicine | Admitting: Dietician

## 2014-11-27 DIAGNOSIS — Z713 Dietary counseling and surveillance: Secondary | ICD-10-CM | POA: Diagnosis not present

## 2014-11-27 DIAGNOSIS — Z6841 Body Mass Index (BMI) 40.0 and over, adult: Secondary | ICD-10-CM | POA: Insufficient documentation

## 2014-11-27 NOTE — Patient Instructions (Addendum)
Goals:  Follow Phase 3B: High Protein + Non-Starchy Vegetables  Eat 3-6 small meals/snacks, every 3-5 hrs  Increase lean protein foods to meet 60g goal  Increase fluid intake to 64oz +  Avoid drinking 15 minutes before, during and 30 minutes after eating  Aim for >30 min of physical activity daily  TANITA  BODY COMP RESULTS  09/03/14 10/09/14 11/27/14   BMI (kg/m^2) 54.7 50.4 47.3   Fat Mass (lbs) 190.5 184.5 150.5   Fat Free Mass (lbs) 169.5 147.0 165.5   Total Body Water (lbs) 124 107.5 121

## 2014-11-27 NOTE — Progress Notes (Signed)
  Follow-up visit:  8 Weeks Post-Operative Gastric Sleeve Surgery  Medical Nutrition Therapy:  Appt start time: 500 end time:  550.  Primary concerns today: Post-operative Bariatric Surgery Nutrition Management. Alice Simpson returns having lost another 34 lbs of fat. She started having caffeine again and having protein bars. Can tolerate about 3 oz of meat at a time.  Surgery date: 09/24/2014 Surgery type: Gastric sleeve Start weight at [facility]: 363.5 on 02/20/14 Weight today: 316 lbs  Weight change: 15.5 lbs (34 lbs fat) Total weight lost: 47.5 lbs  TANITA  BODY COMP RESULTS  09/03/14 10/09/14 11/27/14   BMI (kg/m^2) 54.7 50.4 47.3   Fat Mass (lbs) 190.5 184.5 150.5   Fat Free Mass (lbs) 169.5 147.0 165.5   Total Body Water (lbs) 124 107.5 121    Preferred Learning Style:   No preference indicated   Learning Readiness:   Ready  24-hr recall:  *Eats about 5x a day  B (AM): premier or Atkins protein shake and [place] yogurt (30g) Snk (AM):   L (PM): chili (14g) Snk (PM):   D (PM): meat, refried beans, and cheese (14g) Snk (PM): sugar free popsicle, protein shake   Fluid intake: insufficient per patient  Estimated total protein intake: 60+ grams per day  Medications: see list Supplementation: taking  Average CBG per patient: 110-130mg /dL Last patient reported A1c: none recently  Using straws: yes Drinking while eating: no Hair loss: no Carbonated beverages: lets Coke Zero go flat and drinks it (patient understands that this is not recommended) N/V/D/C: some vomiting from eating too quickly; some diarrhea Dumping syndrome: none  Recent physical activity:  BELT program prior to hip replacement, physical therapy  Progress Towards Goal(s):  In progress.  Handouts given during visit include:  Phase 3B lean protein + non starchy vegetables  Fast Food Guide   Nutritional Diagnosis:  [place]-3.3 Overweight/obesity related to past poor dietary habits and physical inactivity as  evidenced by patient w/ recent gastric sleeve surgery following dietary guidelines for continued weight loss.     Intervention:  Nutrition counseling provided.  Teaching Method Utilized:  Visual Auditory Hands on  Barriers to learning/adherence to lifestyle change: recent hip surgery  Demonstrated degree of understanding via:  Teach Back   Monitoring/Evaluation:  Dietary intake, exercise, and body weight. Follow up in 6 weeks for 3 month post-op visit.

## 2014-12-01 ENCOUNTER — Encounter (HOSPITAL_COMMUNITY): Payer: Self-pay | Admitting: Emergency Medicine

## 2014-12-01 ENCOUNTER — Emergency Department (INDEPENDENT_AMBULATORY_CARE_PROVIDER_SITE_OTHER)
Admission: EM | Admit: 2014-12-01 | Discharge: 2014-12-01 | Disposition: A | Discharge status: Home / Self Care | Payer: Medicaid Other | Source: Home / Self Care | Attending: Emergency Medicine | Admitting: Emergency Medicine

## 2014-12-01 DIAGNOSIS — Z5189 Encounter for other specified aftercare: Secondary | ICD-10-CM | POA: Diagnosis not present

## 2014-12-01 NOTE — ED Provider Notes (Signed)
CSN: [id number]     Arrival date & time 12/01/14  1932 History   First MD Initiated Contact with Patient 12/01/14 2003     Chief Complaint  Patient presents with  . Post-op Problem   (Consider location/radiation/quality/duration/timing/severity/associated sxs/prior Treatment) HPI She is a 49 year old woman here for evaluation of wound.  She had a right total hip replaced on July 5.  After the operation they placed a dressing that she was to leave in place until her appointment on Monday. Today, she noted some drainage from the bandage. She called her surgeon's office and they recommended she either change the dressing herself or be evaluated at urgent care. No fevers or chills. No increased incisional pain.  Past Medical History  Diagnosis Date  . Diabetes mellitus without complication   . Hyperlipidemia   . Depression   . Anxiety   . Migraine   . Osteoarthritis   . Neuropathy   . PCOS (polycystic ovarian syndrome)    Past Surgical History  Procedure Laterality Date  . Cesarean section    . Wisdom    . Wisdom tooth extraction    . Bunionectomy    . Laparoscopic gastric sleeve resection N/A 09/24/2014    Procedure: LAPAROSCOPIC GASTRIC SLEEVE RESECTION upper endoscopy;  Surgeon: Johnathan Hausen, MD;  Location: [facility];  Service: General;  Laterality: N/A;  . Total hip arthroplasty Right 11/20/2014  . Total hip arthroplasty Right 11/20/2014    Procedure: TOTAL HIP ARTHROPLASTY ANTERIOR APPROACH;  Surgeon: Melrose Nakayama, MD;  Location: [facility];  Service: Orthopedics;  Laterality: Right;   Family History  Problem Relation Age of Onset  . Diabetes Other   . Depression Other   . Anxiety disorder Other    History  Substance Use Topics  . Smoking status: Former Smoker -- 1.00 packs/day for 25 years    Types: Cigarettes    Quit date: 05/18/2004  . Smokeless tobacco: Never Used  . Alcohol Use: Yes     Comment: rarely when does is liquor    OB History    No data available      Review of Systems As in history of present illness Allergies  Lactose intolerance (gi); Penicillin g; and Prochlorperazine  Home Medications   Prior to Admission medications   Medication Sig Start Date End Date Taking? Authorizing Provider  buPROPion (WELLBUTRIN XL) 150 MG 24 hr tablet Take 150 mg by mouth every morning.    Yes Historical Provider, MD  FLUoxetine (PROZAC) 40 MG capsule Take 40 mg by mouth at bedtime.    Yes Historical Provider, MD  metaxalone (SKELAXIN) 800 MG tablet Take 800 mg by mouth 2 (two) times daily as needed for muscle spasms.    Yes Historical Provider, MD  metFORMIN (GLUCOPHAGE-XR) 500 MG 24 hr tablet Take 1 tablet (500 mg total) by mouth 2 (two) times daily. Patient taking differently: Take 500 mg by mouth daily with breakfast.  09/26/14  Yes Philemon Kingdom, MD  norethindrone (MICRONOR,CAMILA,ERRIN) 0.35 MG tablet Take 1 tablet (0.35 mg total) by mouth daily. Patient taking differently: Take 1 tablet by mouth at bedtime.  06/09/13  Yes Philemon Kingdom, MD  oxyCODONE (ROXICODONE) 5 MG/5ML solution Take 5-10 mLs (5-10 mg total) by mouth every 3 (three) hours as needed for moderate pain or severe pain. 11/23/14  Yes Loni Dolly, PA-C  simvastatin (ZOCOR) 40 MG tablet Take 40 mg by mouth every evening.    Yes Historical Provider, MD  warfarin (COUMADIN) 7.5 MG tablet Take  1 tablet (7.5 mg total) by mouth one time only at 6 PM. 11/23/14  Yes Loni Dolly, PA-C  acetaminophen (TYLENOL) 500 MG tablet Take 500 mg by mouth daily as needed for moderate pain.    Historical Provider, MD  ALPRAZolam Duanne Moron) 0.5 MG tablet Take 0.5 mg by mouth daily as needed for anxiety.     Historical Provider, MD  Calcium Citrate-Vitamin D (CALCIUM CITRATE + PO) Take 600 mg by mouth 3 (three) times daily.    Historical Provider, MD  cyanocobalamin 500 MCG tablet Take 500 mcg by mouth every morning.     Historical Provider, MD  diazepam (VALIUM) 10 MG tablet Take 1 tablet by mouth. Take one the  night before, the morning an hour before the procedure and then an extra to take when the procedure starts if pt. Feels she needs it.  Before Cortisone Shots in doctors office. 08/17/14   Historical Provider, MD  enoxaparin (LOVENOX) 80 MG/0.8ML injection Inject 0.7 mLs (70 mg total) into the skin daily. 11/24/14   Loni Dolly, PA-C  Lactase (LACTAID PO) Take 1 tablet by mouth daily as needed (before eating d).    Historical Provider, MD  Meclizine HCl (TRAVEL SICKNESS) 25 MG CHEW Chew 25 mg by mouth 2 (two) times daily as needed (dizziness).     Historical Provider, MD  methocarbamol (ROBAXIN) 500 MG tablet Take 1 tablet (500 mg total) by mouth every 6 (six) hours as needed for muscle spasms. 11/23/14   Loni Dolly, PA-C  Multiple Vitamin (MULTI-VITAMINS) TABS Take 1 tablet by mouth 2 (two) times daily.     Historical Provider, MD  ondansetron (ZOFRAN) 4 MG tablet Take 4 mg by mouth daily as needed for nausea or vomiting.     Historical Provider, MD  PRESCRIPTION MEDICATION Inject 1 each into the skin once. Cortisone Shots in Spine, SI joint, and Hip.    Historical Provider, MD  SF 5000 PLUS 1.1 % CREA dental cream Take 1 application by mouth 2 (two) times daily. 07/26/14   Historical Provider, MD  sitaGLIPtin (JANUVIA) 100 MG tablet Take 1 tablet (100 mg total) by mouth daily. Patient not taking: Reported on 11/08/2014 08/17/14   Philemon Kingdom, MD   BP 147/92 mmHg  Pulse 90  Temp(Src) 98.9 F (37.2 C) (Oral)  Resp 20  SpO2 96%  LMP 11/16/2014 Physical Exam  Constitutional: She is oriented to person, place, and time. She appears well-developed and well-nourished. No distress.  Cardiovascular: Normal rate.   Pulmonary/Chest: Effort normal.  Neurological: She is alert and oriented to person, place, and time.  Skin:  7 inch incision over anterior right thigh. Superior aspect is slightly erythematous. It appears to have been rubbing against itself. No surrounding erythema or drainage. No pus.     ED Course  Procedures (including critical care time) Labs Review Labs Reviewed - No data to display  Imaging Review No results found.   MDM   1. Visit for wound check    Wound washed with Betadine. Antibiotic ointment and xeroform applied. Follow-up with surgeon as scheduled on Monday.    Melony Overly, MD 12/01/14 2034

## 2014-12-01 NOTE — ED Notes (Signed)
Pt reports she had right side hip replacement on 7/5 Reports drainage at incision site and would like for provider to evaluate site Has appt w/surgeon on Monday Alert, no signs of acute distress.

## 2014-12-01 NOTE — Discharge Instructions (Signed)
The wound looks like it is doing well. There is no sign of infection. We have re-bandaged it today. Leave the bandage on until you see the surgeon on Monday.

## 2015-01-03 ENCOUNTER — Encounter: Payer: Medicaid Other | Attending: Internal Medicine | Admitting: Dietician

## 2015-01-03 DIAGNOSIS — Z6841 Body Mass Index (BMI) 40.0 and over, adult: Secondary | ICD-10-CM | POA: Diagnosis not present

## 2015-01-03 DIAGNOSIS — Z713 Dietary counseling and surveillance: Secondary | ICD-10-CM | POA: Insufficient documentation

## 2015-01-03 NOTE — Patient Instructions (Addendum)
Goals:  Follow Phase 3B: High Protein + Non-Starchy Vegetables  Eat 3-6 small meals/snacks, every 3-5 hrs  Increase lean protein foods to meet 60g goal  Increase fluid intake to 64oz +  Avoid drinking 15 minutes before, during and 30 minutes after eating  Aim for >30 min of physical activity daily  Keep practicing chewing thoroughly and eating slowly  Try Shirataki noodles (found in produce section)  Aim for 15 grams or less of carbohydrates per meal

## 2015-01-03 NOTE — Progress Notes (Signed)
  Follow-up visit:  3 months Post-Operative Gastric Sleeve Surgery  Medical Nutrition Therapy:  Appt start time: 1140 end time:  1230  Primary concerns today: Post-operative Bariatric Surgery Nutrition Management. Alice Simpson returns having lost another 21 lbs. She reports that solid foods upset her stomach; feels like she is chewing thoroughly and eating slowly most of the time. Doing BELT program and physical therapy 2x a week. Applied for SSI (supplementary income) and was denied. Has been under a lot of financial stress. Drinking 1 Premier shake per day. Can tolerate about 3 oz of meat at a time. Hip pain is improving but having some trouble with hip flexor; possible pinched nerves. She does not weigh herself at home. Eating some fruit and nuts.   Surgery date: 09/24/2014 Surgery type: Gastric sleeve Start weight at [facility]: 363.5 on 02/20/14 Weight today: 295 lbs Weight change: 21 Total weight lost: 68.5 lbs  TANITA  BODY COMP RESULTS  09/03/14 10/09/14 11/27/14 01/03/15   BMI (kg/m^2) 54.7 50.4 47.3 44.9   Fat Mass (lbs) 190.5 184.5 150.5 145   Fat Free Mass (lbs) 169.5 147.0 165.5 150   Total Body Water (lbs) 124 107.5 121 110    Preferred Learning Style:   No preference indicated   Learning Readiness:   Ready  24-hr recall:  B (AM): premier protein shake and [place] yogurt (45g) Snk (AM):   L (PM): deli meat and cheese or rotisserie chicken (14g) Snk (PM):   D (PM): chili or taco meat with salsa (14g) Snk (PM): homemade sugar free pudding pop  Fluid intake: unable to determine total amount; tea with sweetener, 32 oz water; gets more water on days she exercises   Estimated total protein intake: 75 grams per day  Medications: see list Supplementation: taking  Last patient reported A1c: 5.6%  Using straws: yes Drinking while eating: trying not to Hair loss: no Carbonated beverages: shakes diet soda to get bubbles (patient understands that this is not recommended) N/V/D/C:  intermittent diarrhea and constipation Dumping syndrome: none  Recent physical activity:  BELT program, physical therapy  Progress Towards Goal(s):  In progress.   Nutritional Diagnosis:  [place]-3.3 Overweight/obesity related to past poor dietary habits and physical inactivity as evidenced by patient w/ recent gastric sleeve surgery following dietary guidelines for continued weight loss.     Intervention:  Nutrition counseling provided.  Goals:  Keep practicing chewing thoroughly and eating slowly  Try Shirataki noodles (found in produce section)  Aim for 15 grams or less of carbohydrates per meal  Teaching Method Utilized:  Visual Auditory Hands on  Barriers to learning/adherence to lifestyle change: recent hip surgery  Demonstrated degree of understanding via:  Teach Back   Monitoring/Evaluation:  Dietary intake, exercise, and body weight. Follow up in 6 weeks for 4.5 month post-op visit.

## 2015-01-04 ENCOUNTER — Encounter: Payer: Self-pay | Admitting: Dietician

## 2015-02-07 ENCOUNTER — Ambulatory Visit: Payer: Self-pay | Admitting: Dietician

## 2016-03-25 IMAGING — RF DG UGI W/ GASTROGRAFIN
13 series · 13 of 13 positions shown · IV contrast (omnipaque)
Comparison: Upper GI 02/13/2014

CLINICAL DATA: Patient status post bariatric gastric sleeve surgery

EXAM:
WATER SOLUBLE UPPER GI SERIES
TECHNIQUE: Single-column upper GI series was performed using water soluble
contrast.
CONTRAST:  40mL OMNIPAQUE IOHEXOL 300 MG/ML  SOLN

[Series 1: run · 1 of 1 slices shown (1 of 12)]
[im 1/1]
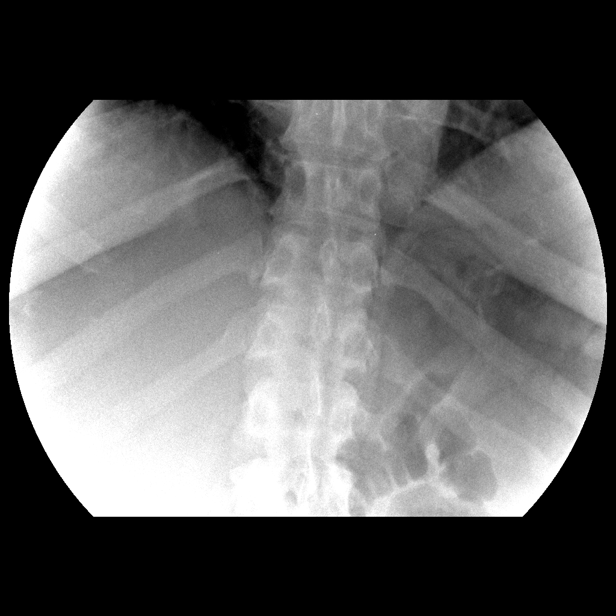

[Series 2: run · 1 of 1 slices shown (2 of 12)]
[im 1/1]
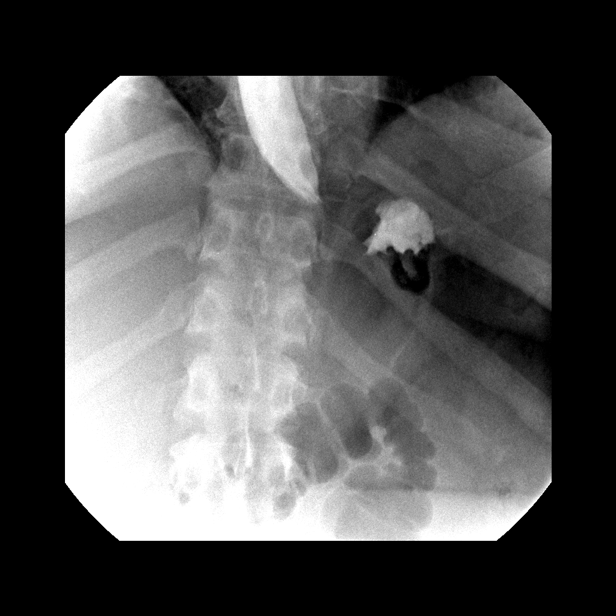

[Series 3: run · 1 of 1 slices shown (3 of 12)]
[im 1/1]
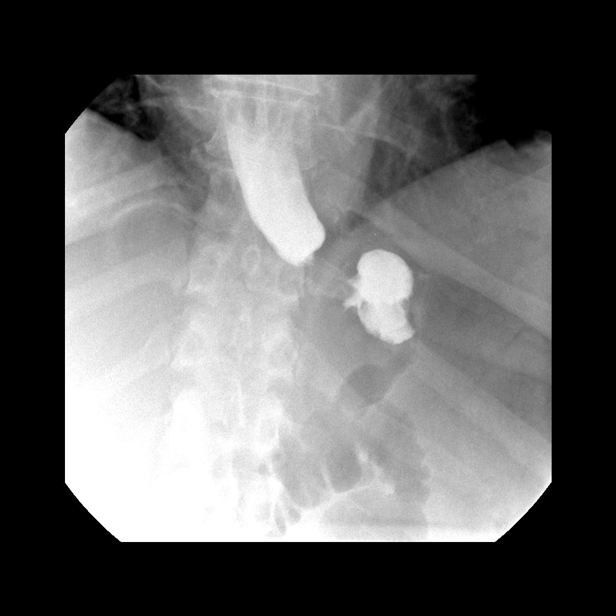

[Series 4: run · 1 of 1 slices shown (4 of 12)]
[im 1/1]
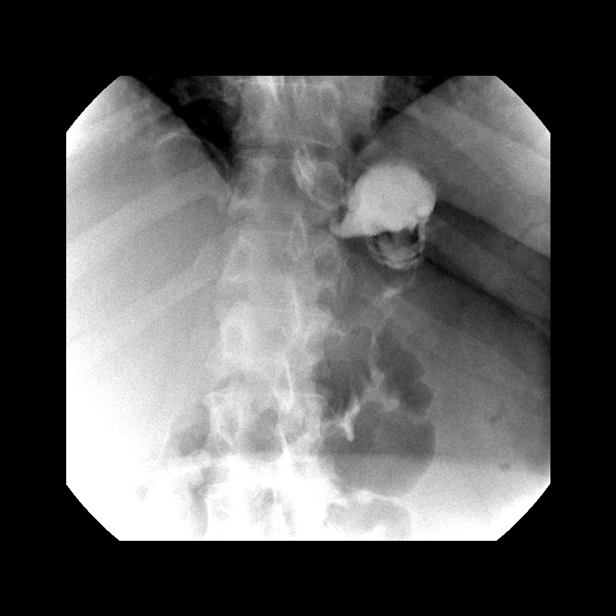

[Series 5: run · 1 of 1 slices shown (5 of 12)]
[im 1/1]
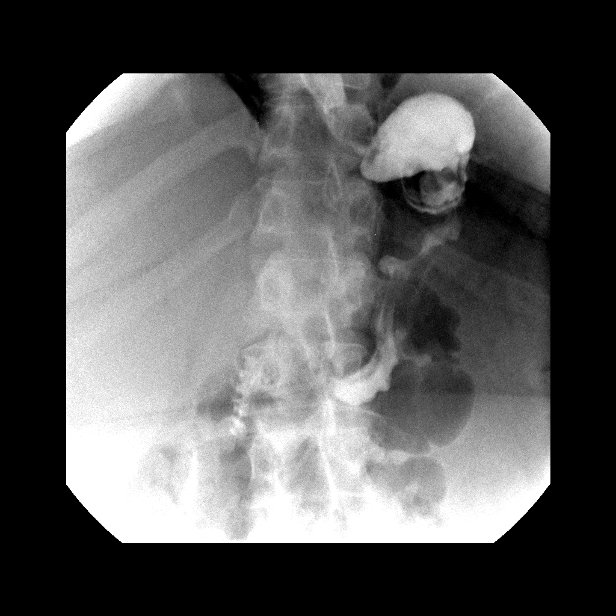

[Series 6: run · 1 of 1 slices shown (6 of 12)]
[im 1/1]
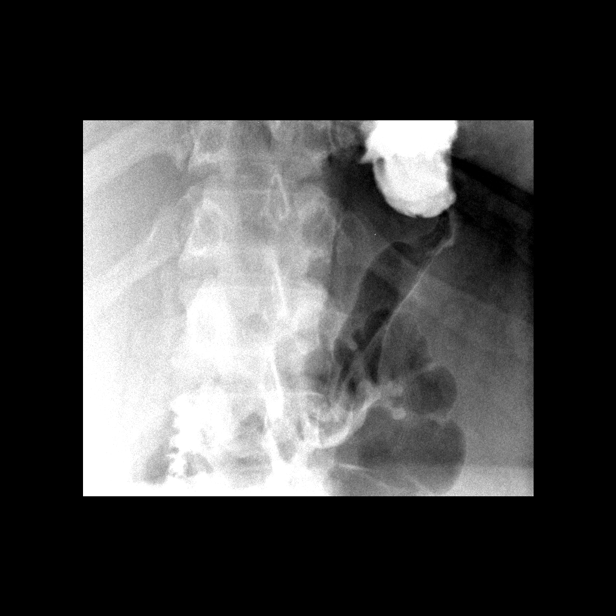

[Series 7: run · 1 of 1 slices shown (7 of 12)]
[im 1/1]
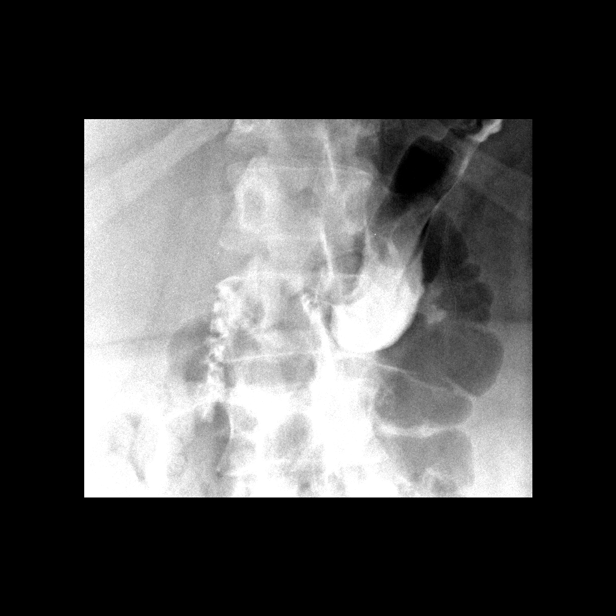

[Series 8: run · 1 of 1 slices shown (8 of 12)]
[im 1/1]
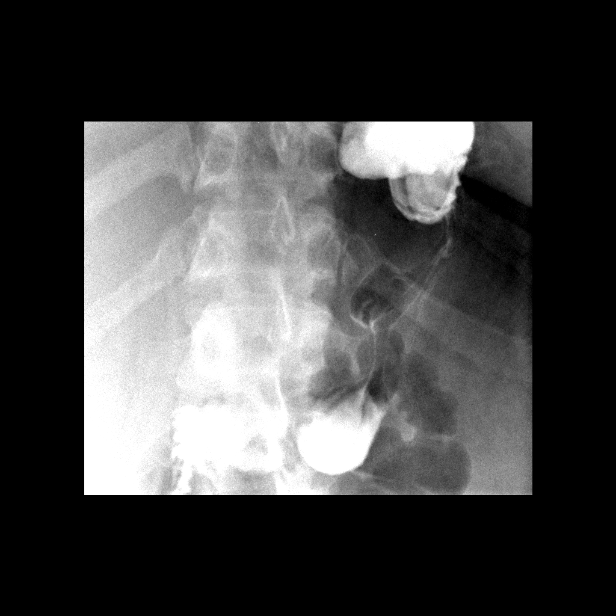

[Series 9: run · 1 of 1 slices shown (9 of 12)]
[im 1/1]
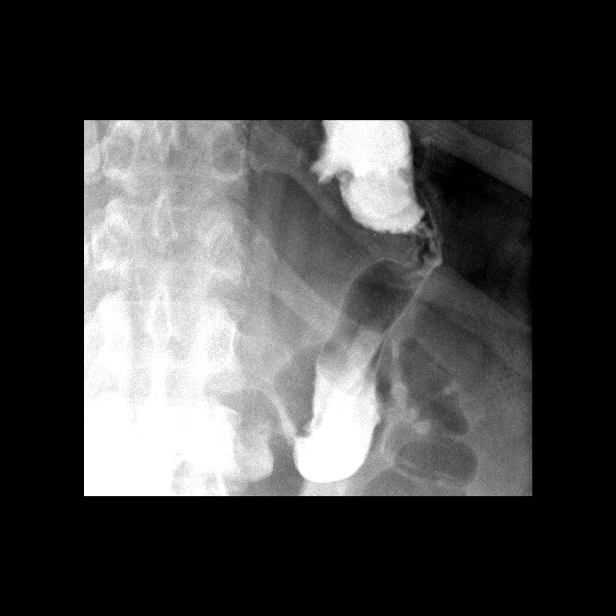

[Series 10: run · 1 of 1 slices shown (10 of 12)]
[im 1/1]
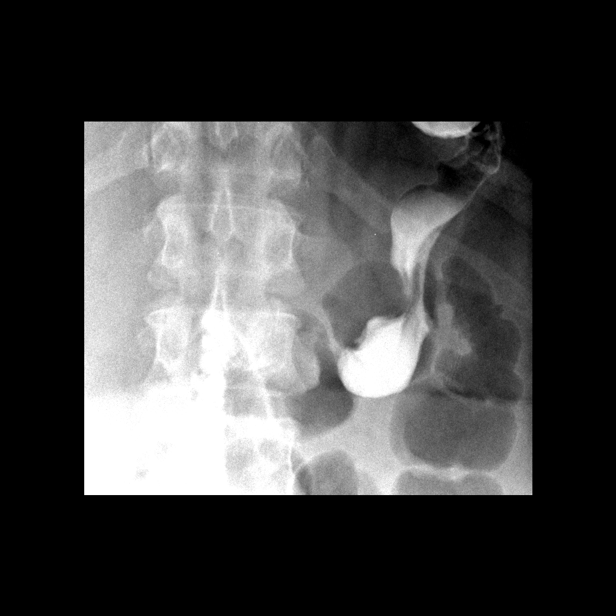

[Series 11: run · 1 of 1 slices shown (11 of 12)]
[im 1/1]
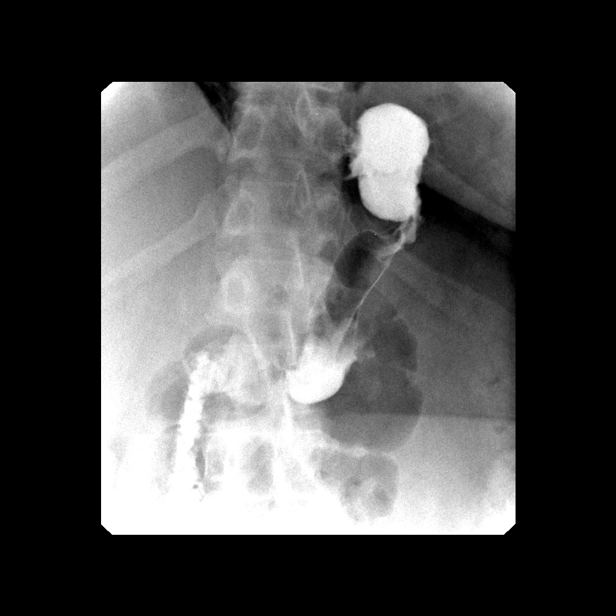

[Series 12: run · 1 of 1 slices shown (12 of 12)]
[im 1/1]
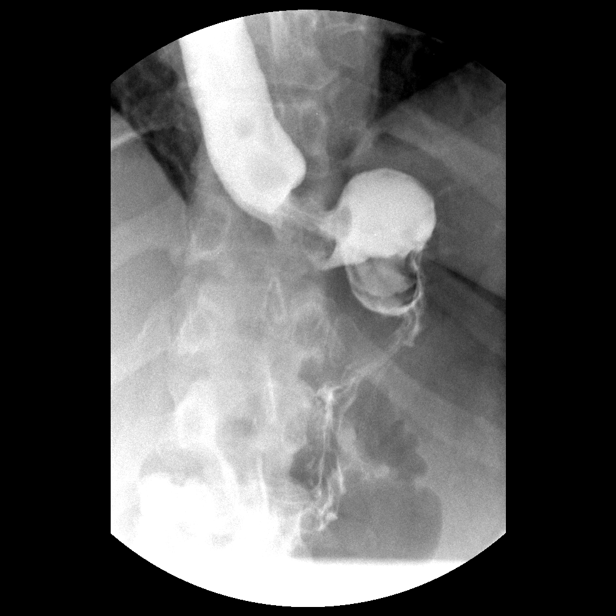

[Series 1001: view not recorded · 0.20mm/px · 1 of 1 slices shown]
[im 1/1]
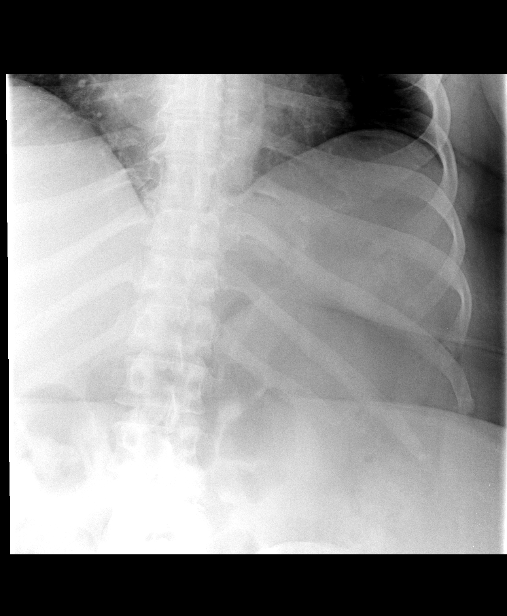

[13 of 13 positions shown; findings below may reference images not displayed]

FLUOROSCOPY TIME:  Radiation Exposure Index (as provided by the
fluoroscopic device):

If the device does not provide the exposure index:

Fluoroscopy Time (in minutes and seconds):  1 minutes 30 seconds

Number of Acquired Images:  12
FINDINGS: Water-soluble contrast flowed readily through the gastroesophageal
junction into the gastric sleeve. Contrast slowly moved through the
gastric sleeve into the first and second portion the duodenum. No
evidence of obstruction or leak. Mild stasis within the esophagus.
IMPRESSION: No evidence of obstruction or leak following gastric sleeve
bariatric surgery.

Mild stasis within the esophagus.

## 2016-04-28 ENCOUNTER — Encounter (HOSPITAL_COMMUNITY): Payer: Self-pay

## 2016-06-09 ENCOUNTER — Other Ambulatory Visit: Payer: Self-pay | Admitting: Internal Medicine

## 2017-04-29 ENCOUNTER — Encounter (HOSPITAL_COMMUNITY): Payer: Self-pay

## 2017-10-20 ENCOUNTER — Other Ambulatory Visit: Payer: Self-pay | Admitting: Family Medicine

## 2017-10-20 DIAGNOSIS — Z1231 Encounter for screening mammogram for malignant neoplasm of breast: Secondary | ICD-10-CM

## 2017-11-12 ENCOUNTER — Ambulatory Visit: Payer: Self-pay

## 2017-11-12 ENCOUNTER — Ambulatory Visit
Admission: RE | Admit: 2017-11-12 | Discharge: 2017-11-12 | Disposition: A | Discharge status: Home / Self Care | Payer: Medicaid Other | Source: Ambulatory Visit | Attending: Family Medicine | Admitting: Family Medicine

## 2017-11-12 DIAGNOSIS — Z1231 Encounter for screening mammogram for malignant neoplasm of breast: Secondary | ICD-10-CM

## 2017-12-07 ENCOUNTER — Encounter: Payer: Medicaid Other | Attending: Family Medicine | Admitting: Skilled Nursing Facility1

## 2017-12-07 ENCOUNTER — Encounter: Payer: Self-pay | Admitting: Skilled Nursing Facility1

## 2017-12-07 DIAGNOSIS — Z713 Dietary counseling and surveillance: Secondary | ICD-10-CM | POA: Insufficient documentation

## 2017-12-07 DIAGNOSIS — E119 Type 2 diabetes mellitus without complications: Secondary | ICD-10-CM | POA: Insufficient documentation

## 2017-12-07 NOTE — Progress Notes (Signed)
  Primary concerns today: Post-operative Bariatric Surgery Nutrition Management.  Pt states she does have a dietitian to see in [place] as her eating disorder dietitan. Pt states she is trying to get to the root of the issue (eating issues) with trauma therapy. Pt states she got down to 280 pounds and is now 307 pounds. Pt states she has PCOS and has 2-3 menses a year and is also post menopausal. Pt states she has higher blood sugars again after they were controlled resulting in an A1C of 8.7. Pt states she was without insurance for a few years. Pt state she checks fasting numbers averaging 150's-160's. Pt states she has not been back to see her bariatric surgeon because she feels too embarressed due to her weight gain. Pt states she wakes at 7-7:30am. Pt states she experiences nausea sometimes.   Avoided foods: ice cream, candy, rice, pasta, potatoes, bread  Surgery date: 09/24/2014 Surgery type: Gastric sleeve Start weight at [facility]: 363.5 on 02/20/14 Weight today: 307.8 lbs  TANITA  BODY COMP RESULTS  10/09/14 11/27/14 01/03/15 12/07/2017   BMI (kg/m^2) 50.4 47.3 44.9 46.8   Fat Mass (lbs) 184.5 150.5 145 166.2   Fat Free Mass (lbs) 147.0 165.5 150 141.4   Total Body Water (lbs) 107.5 121 110 105.4    24-hr recall: B (AM): coffee or tea  Snk (AM): cheese or yogurt and then fruit later  L (PM): tomato sandwich Snk (PM): D (PM): ribs and half a banana  Snk (PM):   Fluid intake: diet tea 40 ounces, 32 ounces of regular coffee with 4 packets of splenda, some soda Estimated total protein intake: 60+  Medications: see list Supplementation:  flinestone multi and b12 and biotin   CBG monitoring: 1 a day fasting  Average CBG per patient: 150-160's Last patient reported A1c: 8.7  Using straws: no Drinking while eating: no Having you been chewing well:no Chewing/swallowing difficulties: no Changes in vision: no Changes to mood/headaches: no Hair loss/Cahnges to skin/Changes to nails:  no Any difficulty focusing or concentrating: no Sweating: no Dizziness/Lightheaded: no Palpitations: no  Carbonated beverages: no N/V/D/C/GAS: no Abdominal Pain: no Dumping syndrome: no  Recent physical activity:  ADL's  Progress Towards Goal(s):  In progress.  Handouts given during visit include:  Diabetes book    Intervention:  Nutrition counseling for diabetes. Dietitian educated the pt on diabetes.   Teaching Method Utilized:  Visual Auditory Hands on  Barriers to learning/adherence to lifestyle change: binge eating disorder   Demonstrated degree of understanding via:  Teach Back   Monitoring/Evaluation:  Dietary intake, exercise, and body weight.

## 2017-12-07 NOTE — Patient Instructions (Signed)
-  Start bringing your bottle for water with you  -

## 2017-12-16 ENCOUNTER — Emergency Department (HOSPITAL_COMMUNITY)
Admission: EM | Admit: 2017-12-16 | Discharge: 2017-12-16 | Disposition: A | Discharge status: Home / Self Care | Payer: Medicaid Other | Attending: Emergency Medicine | Admitting: Emergency Medicine

## 2017-12-16 ENCOUNTER — Other Ambulatory Visit: Payer: Self-pay | Admitting: Family Medicine

## 2017-12-16 ENCOUNTER — Encounter (HOSPITAL_COMMUNITY): Payer: Self-pay | Admitting: Emergency Medicine

## 2017-12-16 ENCOUNTER — Other Ambulatory Visit: Payer: Self-pay

## 2017-12-16 ENCOUNTER — Ambulatory Visit
Admission: RE | Admit: 2017-12-16 | Discharge: 2017-12-16 | Disposition: A | Discharge status: Home / Self Care | Payer: Medicaid Other | Source: Ambulatory Visit | Attending: Family Medicine | Admitting: Family Medicine

## 2017-12-16 DIAGNOSIS — Z7984 Long term (current) use of oral hypoglycemic drugs: Secondary | ICD-10-CM | POA: Insufficient documentation

## 2017-12-16 DIAGNOSIS — K5732 Diverticulitis of large intestine without perforation or abscess without bleeding: Secondary | ICD-10-CM | POA: Insufficient documentation

## 2017-12-16 DIAGNOSIS — K5792 Diverticulitis of intestine, part unspecified, without perforation or abscess without bleeding: Secondary | ICD-10-CM

## 2017-12-16 DIAGNOSIS — Z87891 Personal history of nicotine dependence: Secondary | ICD-10-CM | POA: Insufficient documentation

## 2017-12-16 DIAGNOSIS — E119 Type 2 diabetes mellitus without complications: Secondary | ICD-10-CM | POA: Insufficient documentation

## 2017-12-16 DIAGNOSIS — Z7901 Long term (current) use of anticoagulants: Secondary | ICD-10-CM | POA: Diagnosis not present

## 2017-12-16 DIAGNOSIS — Z79899 Other long term (current) drug therapy: Secondary | ICD-10-CM | POA: Diagnosis not present

## 2017-12-16 DIAGNOSIS — R1032 Left lower quadrant pain: Secondary | ICD-10-CM | POA: Diagnosis present

## 2017-12-16 DIAGNOSIS — Z96641 Presence of right artificial hip joint: Secondary | ICD-10-CM | POA: Insufficient documentation

## 2017-12-16 LAB — COMPREHENSIVE METABOLIC PANEL
ALT: 15 U/L (ref 0–44)
AST: 15 U/L (ref 15–41)
Albumin: 3.5 g/dL (ref 3.5–5.0)
Alkaline Phosphatase: 65 U/L (ref 38–126)
Anion gap: 12 (ref 5–15)
BUN: 8 mg/dL (ref 6–20)
CO2: 26 mmol/L (ref 22–32)
Calcium: 9.3 mg/dL (ref 8.9–10.3)
Chloride: 100 mmol/L (ref 98–111)
Creatinine, Ser: 0.7 mg/dL (ref 0.44–1.00)
GFR calc Af Amer: >60 mL/min (ref >60)
GFR calc non Af Amer: >60 mL/min (ref >60)
Glucose, Bld: 142 mg/dL — ABNORMAL HIGH (ref 70–99)
Potassium: 4 mmol/L (ref 3.5–5.1)
Sodium: 138 mmol/L (ref 135–145)
Total Bilirubin: 1.1 mg/dL (ref 0.3–1.2)
Total Protein: 8.9 g/dL — ABNORMAL HIGH (ref 6.5–8.1)

## 2017-12-16 LAB — CBC WITH DIFFERENTIAL/PLATELET
Abs Immature Granulocytes: 0.1 10*3/uL (ref 0.0–0.1)
Basophils Absolute: 0.1 10*3/uL (ref 0.0–0.1)
Basophils Relative: 1 %
Eosinophils Absolute: 0.2 10*3/uL (ref 0.0–0.7)
Eosinophils Relative: 1 %
HCT: 42.2 % (ref 36.0–46.0)
Hemoglobin: 13.2 g/dL (ref 12.0–15.0)
Immature Granulocytes: 0 %
Lymphocytes Relative: 17 %
Lymphs Abs: 2.7 10*3/uL (ref 0.7–4.0)
MCH: 26.1 pg (ref 26.0–34.0)
MCHC: 31.3 g/dL (ref 30.0–36.0)
MCV: 83.4 fL (ref 78.0–100.0)
Monocytes Absolute: 1 10*3/uL (ref 0.1–1.0)
Monocytes Relative: 6 %
Neutro Abs: 12.1 10*3/uL — ABNORMAL HIGH (ref 1.7–7.7)
Neutrophils Relative %: 75 %
Platelets: 367 10*3/uL (ref 150–400)
RBC: 5.06 MIL/uL (ref 3.87–5.11)
RDW: 13 % (ref 11.5–15.5)
WBC: 16.2 10*3/uL — ABNORMAL HIGH (ref 4.0–10.5)

## 2017-12-16 LAB — LIPASE, BLOOD: Lipase: 31 U/L (ref 11–51)

## 2017-12-16 MED ORDER — ACETAMINOPHEN 325 MG PO TABS: 650.0000 mg | ORAL_TABLET | Freq: Four times a day (QID) | ORAL | 0 refills | Status: DC | PRN

## 2017-12-16 MED ORDER — HYDROCODONE-ACETAMINOPHEN 5-325 MG PO TABS: 1.0000 | ORAL_TABLET | ORAL | 0 refills | Status: DC | PRN

## 2017-12-16 MED ORDER — HYDROCODONE-ACETAMINOPHEN 5-325 MG PO TABS
1.0000 | ORAL_TABLET | Freq: Once | ORAL | Status: AC
  Administered 2017-12-16: 1 via ORAL
  Filled 2017-12-16: qty 1

## 2017-12-16 MED ORDER — ONDANSETRON HCL 4 MG PO TABS
4.0000 mg | ORAL_TABLET | Freq: Four times a day (QID) | ORAL | 0 refills | Status: DC
Start: 2017-12-16 — End: 2020-11-22

## 2017-12-16 NOTE — ED Provider Notes (Signed)
[facility] EMERGENCY DEPARTMENT Provider Note   CSN: [id number] Arrival date & time: 12/16/17  1352     History   Chief Complaint Chief Complaint  Patient presents with  . Abdominal Pain    HPI Alice Simpson is a 52 y.o. female.  HPI   52 year old female presents today with particular.patient reports a one-week history of abdominal pain, worse on the left lower and mid abdomen. She denies any nausea or vomiting, denies any fever. She was seen by her primary care ordered a CT scan showing diverticulitis or chew was referred here for further evaluation. She notes that the primary care provider prescription for medications and gave her the option to have a dose of IV antibiotics in the emergency room. Patient poor to history diabetes but reports this is well controlled.  Past Medical History:  Diagnosis Date  . Anxiety   . Depression   . Diabetes mellitus without complication ([place])   . Hyperlipidemia   . Migraine   . Neuropathy   . Osteoarthritis   . PCOS (polycystic ovarian syndrome)     Patient Active Problem List   Diagnosis Date Noted  . Primary osteoarthritis of right hip 11/20/2014  . Morbid obesity ([place]) 11/20/2014  . S/P laparoscopic sleeve gastrectomy 09/24/2014  . PCOS (polycystic ovarian syndrome) 08/02/2013  . Depressive disorder 05/26/2013  . Type 2 diabetes mellitus, uncontrolled ([place]) 05/26/2013  . Hyperlipidemia 05/26/2013  . Low back pain 05/26/2013  . Migraine 05/26/2013    Past Surgical History:  Procedure Laterality Date  . BUNIONECTOMY    . CESAREAN SECTION    . LAPAROSCOPIC GASTRIC SLEEVE RESECTION N/A 09/24/2014   Procedure: LAPAROSCOPIC GASTRIC SLEEVE RESECTION upper endoscopy;  Surgeon: Johnathan Hausen, MD;  Location: [facility];  Service: General;  Laterality: N/A;  . TOTAL HIP ARTHROPLASTY Right 11/20/2014  . TOTAL HIP ARTHROPLASTY Right 11/20/2014   Procedure: TOTAL HIP ARTHROPLASTY ANTERIOR APPROACH;  Surgeon: Melrose Nakayama, MD;  Location: [facility];  Service: Orthopedics;  Laterality: Right;  . wisdom    . WISDOM TOOTH EXTRACTION       OB History   None      Home Medications    Prior to Admission medications   Medication Sig Start Date End Date Taking? Authorizing Provider  acetaminophen (TYLENOL) 325 MG tablet Take 2 tablets (650 mg total) by mouth every 6 (six) hours as needed. 12/16/17   Tarique Loveall, Dellis Filbert, PA-C  ALPRAZolam Duanne Moron) 0.5 MG tablet Take 0.5 mg by mouth daily as needed for anxiety.     [provider]  buPROPion (WELLBUTRIN XL) 150 MG 24 hr tablet Take 150 mg by mouth every morning.     [provider]  Calcium Citrate-Vitamin D (CALCIUM CITRATE + PO) Take 600 mg by mouth 3 (three) times daily.    [provider]  cyanocobalamin 500 MCG tablet Take 500 mcg by mouth every morning.     [provider]  diazepam (VALIUM) 10 MG tablet Take 1 tablet by mouth. Take one the night before, the morning an hour before the procedure and then an extra to take when the procedure starts if pt. Feels she needs it.  Before Cortisone Shots in doctors office. 08/17/14   [provider]  enoxaparin (LOVENOX) 80 MG/0.8ML injection Inject 0.7 mLs (70 mg total) into the skin daily. 11/24/14   Loni Dolly, PA-C  FLUoxetine (PROZAC) 40 MG capsule Take 40 mg by mouth at bedtime.     [provider]  HYDROcodone-acetaminophen (NORCO/VICODIN) 5-325 MG tablet Take 1 tablet by mouth every 4 (four) hours as needed. 12/16/17   Tamarius Rosenfield, Dellis Filbert, PA-C  Lactase (LACTAID PO) Take 1 tablet by mouth daily as needed (before eating d).    [provider]  Meclizine HCl (TRAVEL SICKNESS) 25 MG CHEW Chew 25 mg by mouth 2 (two) times daily as needed (dizziness).     [provider]  metaxalone (SKELAXIN) 800 MG tablet Take 800 mg by mouth 2 (two) times daily as needed for muscle spasms.     [provider]  metFORMIN (GLUCOPHAGE-XR) 500 MG 24 hr tablet Take 1  tablet (500 mg total) by mouth 2 (two) times daily. Patient taking differently: Take 500 mg by mouth daily with breakfast.  09/26/14   Philemon Kingdom, MD  methocarbamol (ROBAXIN) 500 MG tablet Take 1 tablet (500 mg total) by mouth every 6 (six) hours as needed for muscle spasms. 11/23/14   Loni Dolly, PA-C  Multiple Vitamin (MULTI-VITAMINS) TABS Take 1 tablet by mouth 2 (two) times daily.     [provider]  norethindrone (MICRONOR,CAMILA,ERRIN) 0.35 MG tablet Take 1 tablet (0.35 mg total) by mouth daily. Patient taking differently: Take 1 tablet by mouth at bedtime.  06/09/13   Philemon Kingdom, MD  ondansetron (ZOFRAN) 4 MG tablet Take 1 tablet (4 mg total) by mouth every 6 (six) hours. 12/16/17   Arlett Goold, Dellis Filbert, PA-C  oxyCODONE (ROXICODONE) 5 MG/5ML solution Take 5-10 mLs (5-10 mg total) by mouth every 3 (three) hours as needed for moderate pain or severe pain. 11/23/14   Loni Dolly, PA-C  PRESCRIPTION MEDICATION Inject 1 each into the skin once. Cortisone Shots in Spine, SI joint, and Hip.    [provider]  SF 5000 PLUS 1.1 % CREA dental cream Take 1 application by mouth 2 (two) times daily. 07/26/14   [provider]  simvastatin (ZOCOR) 40 MG tablet Take 40 mg by mouth every evening.     [provider]  sitaGLIPtin (JANUVIA) 100 MG tablet Take 1 tablet (100 mg total) by mouth daily. Patient not taking: Reported on 11/08/2014 08/17/14   Philemon Kingdom, MD  warfarin (COUMADIN) 7.5 MG tablet Take 1 tablet (7.5 mg total) by mouth one time only at 6 PM. 11/23/14   Loni Dolly, PA-C    Family History Family History  Problem Relation Age of Onset  . Diabetes Other   . Depression Other   . Anxiety disorder Other     Social History Social History   Tobacco Use  . Smoking status: Former Smoker    Packs/day: 1.00    Years: 25.00    Pack years: 25.00    Types: Cigarettes    Last attempt to quit: 05/18/2004    Years since quitting: 13.5  . Smokeless  tobacco: Never Used  Substance Use Topics  . Alcohol use: Yes    Comment: rarely when does is liquor   . Drug use: Yes    Types: Marijuana    Comment: past hx of as teenager      Allergies   Lactose intolerance (gi); Penicillin g; and Prochlorperazine   Review of Systems Review of Systems  All other systems reviewed and are negative.    Physical Exam Updated Vital Signs BP (!) 180/84 (BP Location: Right Arm)   Pulse 90   Temp 99.2 F (37.3 C) (Oral)   Resp 18   SpO2 99%   Physical Exam  Constitutional: She is oriented to  person, place, and time. She appears well-developed and well-nourished.  HENT:  Head: Normocephalic and atraumatic.  Eyes: Pupils are equal, round, and reactive to light. Conjunctivae are normal. Right eye exhibits no discharge. Left eye exhibits no discharge. No scleral icterus.  Neck: Normal range of motion. No JVD present. No tracheal deviation present.  Pulmonary/Chest: Effort normal. No stridor.  Abdominal:  Tenderness palpation of the left lower and mid abdomen- no significant right-sided upper abdominal tenderness palpation  Neurological: She is alert and oriented to person, place, and time. Coordination normal.  Psychiatric: She has a normal mood and affect. Her behavior is normal. Judgment and thought content normal.  Nursing note and vitals reviewed.    ED Treatments / Results  Labs (all labs ordered are listed, but only abnormal results are displayed) Labs Reviewed  COMPREHENSIVE METABOLIC PANEL - Abnormal; Notable for the following components:      Result Value   Glucose, Bld 142 (*)    Total Protein 8.9 (*)    All other components within normal limits  CBC WITH DIFFERENTIAL/PLATELET - Abnormal; Notable for the following components:   WBC 16.2 (*)    Neutro Abs 12.1 (*)    All other components within normal limits  LIPASE, BLOOD    EKG None  Radiology Ct Abdomen Pelvis Wo Contrast  Result Date: 12/16/2017 CLINICAL DATA:   Acute generalized abdominal pain. EXAM: CT ABDOMEN AND PELVIS WITHOUT CONTRAST TECHNIQUE: Multidetector CT imaging of the abdomen and pelvis was performed following the standard protocol without IV contrast. COMPARISON:  CT scan of January 23, 2005. FINDINGS: Lower chest: No acute abnormality. Hepatobiliary: Minimal cholelithiasis is noted without inflammation. No biliary dilatation is noted. Small right hepatic cyst is noted. Pancreas: Unremarkable. No pancreatic ductal dilatation or surrounding inflammatory changes. Spleen: Normal in size without focal abnormality. Adrenals/Urinary Tract: Adrenal glands are unremarkable. Kidneys are normal, without renal calculi, focal lesion, or hydronephrosis. Bladder is unremarkable. Stomach/Bowel: Status post gastric surgery. The appendix appears normal. There is no evidence of bowel obstruction. Diverticulitis of descending and proximal sigmoid colon is noted. Vascular/Lymphatic: No significant vascular findings are present. No enlarged abdominal or pelvic lymph nodes. Reproductive: Uterus and bilateral adnexa are unremarkable. Other: No abdominal wall hernia or abnormality. No abdominopelvic ascites. Musculoskeletal: No acute or significant osseous findings. IMPRESSION: Diverticulitis of descending and proximal sigmoid colon is noted without abscess formation. Minimal cholelithiasis is noted without inflammation. Electronically Signed   By: Marijo Conception, M.D.   On: 12/16/2017 11:31    Procedures Procedures (including critical care time)  Medications Ordered in ED Medications  HYDROcodone-acetaminophen (NORCO/VICODIN) 5-325 MG per tablet 1 tablet (1 tablet Oral Given 12/16/17 1608)     Initial Impression / Assessment and Plan / ED Course  I have reviewed the triage vital signs and the nursing notes.  Pertinent labs & imaging results that were available during my care of the patient were reviewed by me and considered in my medical decision making (see chart  for details).     Labs: CBC, CMP, lipase  Imaging:  Consults:  Therapeutics:  Discharge Meds: acetaminophen, Norco, Zofran  Assessment/Plan: 52 year old female presents today with diverticulitis. This appears to be uncomplicated with no abscess or perforation. She does have elevation in white count at 16 2 but is afebrile with no tachycardia. She is tolerating by mouth without difficulty and is maintaining adequate hydration. Patient is a diabetic but is well controlled with reasonable watch her here today. Patient took her antibody shortly  before being seen in the emergency room. I discussed options including IV antibiotics fluids versus outpatient oral antibiotics. Given the fact the patient has recently take her antibiotics and is tolerating without difficulty I see no benefit in initiating antibody therapy here as she is not likely a candidate for inpatient management. Patient will be discharged with instructions to continue using antibiotics, returning immediately with any new or worsening signs or symptoms and following up with her primary care if symptoms continue to persist. She verbalized understanding and agreement to today's plan had no further questions or concerns at time of discharge.      Final Clinical Impressions(s) / ED Diagnoses   Final diagnoses:  Diverticulitis    ED Discharge Orders        Ordered    HYDROcodone-acetaminophen (NORCO/VICODIN) 5-325 MG tablet  Every 4 hours PRN     12/16/17 1557    ondansetron (ZOFRAN) 4 MG tablet  Every 6 hours     12/16/17 1557    acetaminophen (TYLENOL) 325 MG tablet  Every 6 hours PRN     12/16/17 1557       Emme Rosenau, Dellis Filbert, PA-C 12/16/17 2133    Francine Graven, DO 12/19/17 1523

## 2017-12-16 NOTE — ED Provider Notes (Signed)
Patient placed in Quick Look pathway, seen and evaluated   Chief Complaint: Abdominal pain  HPI:   Patient presents with a one-week history of abdominal pain.  Patient saw her PCP today who ordered labs and CT which showed diverticulitis in the proximal and descending colon.  We are unable to review lab work.  Patient was given the option by her PCP to try outpatient therapy or come to the emergency department for IV antibiotics.  Patient opted to come here.  She has had loose stools, but nonbloody.  She vomited once last night.    ROS: Abdominal pain, nausea, vomiting, diarrhea; -bloody stools  Physical Exam:   Gen: No distress  Neuro: Awake and Alert  Skin: Warm    Focused Exam: Generalized abdominal tenderness worse in the left right lower quadrants, lungs clear to auscultation, normal rate and rhythm   Initiation of care has begun. The patient has been counseled on the process, plan, and necessity for staying for the completion/evaluation, and the remainder of the medical screening examination    Frederica Kuster, PA-C 12/16/17 [place], DO 12/16/17 1535

## 2017-12-16 NOTE — Discharge Instructions (Addendum)
Please read the attached information. If you have any new or worsening signs or symptoms return immediately to the emergency room. Please follow-up with h er primary care provider if symptoms continue to persist despite antibiotic therapy.

## 2017-12-16 NOTE — ED Triage Notes (Signed)
Pt arrives to ED for abd pain for 1 week with n/v and had a CT today which confirmed  Diverticulitis. Was told to come to ED for IV antibiotics.

## 2018-03-21 ENCOUNTER — Encounter: Payer: Self-pay | Admitting: Registered"

## 2018-03-21 ENCOUNTER — Encounter: Payer: Medicaid Other | Attending: Family Medicine | Admitting: Registered"

## 2018-03-21 DIAGNOSIS — E119 Type 2 diabetes mellitus without complications: Secondary | ICD-10-CM | POA: Diagnosis not present

## 2018-03-21 NOTE — Progress Notes (Signed)
Medical Nutrition Therapy:  Appt start time: 3:15 end time:  4:15.  Assessment:  Primary concerns today: Pt states she has history of diabetes, BED, diverticulosis, arthritis (in spine), and hip pain. Pt states she had bariatric surgery due to needing weight loss for hip replacement. Pt states she took a class on understanding eating disorders and increased anxiety. Pt states she stopped coming to appts due to not having insurance and also stopped taking metformin; blood sugar numbers began increasing.   Pt states she sees a therapist in [place] with [facility]. Pt states they are focusing on trauma therapy. Pt states she has started taking vyvanse.  Pt states she increased eating when daughter was a senior in high school and son was living on campus freshman year of college.   Pt states she avoids bread, pasta, potatoes, and rice. Pt states eating is sometimes overwhelming with diabetes, bariatric surgery, and BED trying to figure out to eat.  Pt states she wants to work on not needing to fall asleep with food on her stomach. Pt states she knows that she needs to do. Pt states she binges with candy and she will be unable to think and blanks out. 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 Pt states she is also currently working on quitting smoking. Pt states she had been smoke-free for 13 years, started smoking this year with a friend; currently wearing a patch.   Pt states she uses weight as a guideline when she felt like she was eating better.     F/U in 1 week  Preferred Learning Style:   No preference indicated    MEDICATIONS: See list   DIETARY INTAKE:  Usual eating pattern includes 3 meals and 1 snacks per day.  Everyday foods include fast food.  Avoided foods include avoids bread, pasta, potatoes, and rice.    24-hr recall:   B: iced coffee + 1/2 sausage egg and cheese bagel S: 1/2 sausage egg and cheese bagel + green pepper  strips  L: Popeye's-chicken sandwich + pumpkin pie S: D: Popeye's-chicken sandwich + chocolate chip cookies  Usual physical activity: none stated  Progress Towards Goal(s):  In progress.   Nutritional Diagnosis:  NB-1.5 Disordered eating pattern As related to binge eating.  As evidenced by dietary recall.  Intervention:  Nutrition education and counseling. Pt was counseled on the importance not focusing on weight, managing diabetes, and physical activity options in the area.  eating to fuel her body. Pt was in agreement with goals listed.  Goals: - Check blood sugars 3-4 times a day: fasting and 2 hours after initial meal.  - Look into finding a stress reliever such as physical activity. Check into [email or web address] and/or [place]  Teaching Method Utilized:  Visual Auditory Hands on  Handouts given during visit include:  [place]  Demonstrated degree of understanding via:  Teach Back   Monitoring/Evaluation:  Dietary intake, exercise, and body weight in 1 week(s).

## 2018-03-21 NOTE — Patient Instructions (Addendum)
-   Check blood sugars 3-4 times a day: fasting and 2 hours after initial meal.   - Look into finding a stress reliever such as physical activity. Check into [email or web address] and/or [place]

## 2018-03-28 ENCOUNTER — Encounter: Payer: Medicaid Other | Admitting: Registered"

## 2018-03-28 DIAGNOSIS — E119 Type 2 diabetes mellitus without complications: Secondary | ICD-10-CM

## 2018-03-28 NOTE — Progress Notes (Signed)
Medical Nutrition Therapy:  Appt start time: 11:30 end time:  12:30.  Assessment:  Primary concerns today: Pt arrives stating she has been checking blood sugars 1-3 times a day: FBS (140-150s) and after meals (170s). Pt states she has been snacking after meals and confused about when to take blood sugar after meals.   Pt states she has been seeing her therapist Jimmye Norman  ([facility] in [place]) on Thursdays. Pt states sometimes she can feel overwhelmed on Wednesday nights. Pt states they have been talking about intuitive eating. Pt states has been reminded to not deny food urges, listen to body and eat.   Pt states she had diverticulosis flare-up soon after colonoscopy and now trying to figure out how to not trigger it again. Pt states she is is ok with where she is. Pt states caffiene is a trigger for smoking and she is trying to quit smoking again. Pt states lately she has not felt like cooking. Pt states she has 2 children, 1 is transgender and learning to adjust to that. Pt states her mom is coming to stay with her in December for a month. Pt states she likes living alone.    MEDICATIONS: See list   DIETARY INTAKE:  Usual eating pattern includes 3 meals and 1 snacks per day.  Everyday foods include fast food.  Avoided foods include avoids bread, pasta, potatoes, and rice.    24-hr recall:  B:  S:  L: ham and cheese sandwich + chips S: candy or cookies  D: ribs, noodles, spiral zucchini, red pesto + butter  Beverages: coffee, unsweetened tea  Usual physical activity: none stated  Preferred Learning Style:   No preference indicated    Progress Towards Goal(s):  In progress.   Nutritional Diagnosis:  NB-1.5 Disordered eating pattern As related to binge eating.  As evidenced by dietary recall.  Intervention:  Nutrition education and counseling. Pt was counseled on the importance not focusing on weight, managing diabetes, and eating to nourish her body. Pt was in  agreement with goals listed.  Goals: - Get bariatric multivitamins.  - Continue to add veggies to meals. Center  Teaching Method Utilized:  Visual Auditory Hands on  Handouts given during visit include:  none  Demonstrated degree of understanding via:  Teach Back   Monitoring/Evaluation:  Dietary intake, exercise, and body weight in 1 week(s).

## 2018-03-28 NOTE — Patient Instructions (Addendum)
-   Get bariatric multivitamins.   - Continue to add veggies to meals.

## 2018-04-04 ENCOUNTER — Encounter: Payer: Medicaid Other | Admitting: Registered"

## 2018-04-04 DIAGNOSIS — E119 Type 2 diabetes mellitus without complications: Secondary | ICD-10-CM

## 2018-04-04 NOTE — Progress Notes (Signed)
Medical Nutrition Therapy:  Appt start time: 2:00 end time:  3:00.  Assessment:  Primary concerns today:  Pt arrives stating she has been checking blood sugars 1-3 times a day: FBS (119-150) and after meals (100-150). Blood sugar numbers are doing well. Pt states she is still working on getting bariatric multivitamins. Pt states she is trying to prepare herself for when her mom comes next month. Pt states a lot of their conversations are about food. Pt states when she eats, it makes her sleepy. Pt states she needs food to make her sleepy at night. Pt states it is overwhelming to think about intuitive eating and it working against her years of binging.   Pt states she has been seeing her therapist Jimmye Norman  ([facility] in [place]) on Thursdays. Pt states sometimes she can feel overwhelmed on Wednesday nights. Pt states they have been talking about intuitive eating. Pt states has been reminded to not deny food urges, listen to body and eat.   Pt states she had diverticulosis flare-up soon after colonoscopy and now trying to figure out how to not trigger it again. Pt states she is is ok with where she is. Pt states caffiene is a trigger for smoking and she is trying to quit smoking again. Pt states lately she has not felt like cooking. Pt states she has 2 children, 1 is transgender and learning to adjust to that. Pt states her mom is coming to stay with her in December for a month. Pt states she likes living alone.    MEDICATIONS: See list   DIETARY INTAKE:  Usual eating pattern includes 3 meals and 1 snacks per day.  Everyday foods include fast food.  Avoided foods include avoids bread, pasta, potatoes, and rice.    24-hr recall:  B: peanut butter + banana + bread S:  L: ham and cheese sandwich + chips S: candy or cookies  D: ribs, noodles, spiral zucchini, red pesto + butter  Beverages: coffee, unsweetened tea, mello yello  Usual physical activity: none stated  Preferred  Learning Style:   No preference indicated    Progress Towards Goal(s):  In progress.   Nutritional Diagnosis:  NB-1.5 Disordered eating pattern As related to binge eating.  As evidenced by dietary recall.  Intervention:  Nutrition education and counseling. Pt was counseled on the importance of eating to nourish her body. Pt was in agreement with goals listed.  Goals: - Add in breakfast option of carbohydrate + protein within a few hours of waking.  - Keep up the great work!  Teaching Method Utilized:  Visual Auditory Hands on  Handouts given during visit include:  none  Demonstrated degree of understanding via:  Teach Back   Monitoring/Evaluation:  Dietary intake, exercise, and body weight in 1 week(s).

## 2018-04-04 NOTE — Patient Instructions (Signed)
-   Add in breakfast option of carbohydrate + protein within a few hours of waking.   - Keep up the great work!

## 2018-04-13 ENCOUNTER — Ambulatory Visit: Payer: Self-pay | Admitting: Registered"

## 2018-04-18 ENCOUNTER — Encounter: Payer: Medicaid Other | Attending: Family Medicine | Admitting: Registered"

## 2018-04-18 DIAGNOSIS — E119 Type 2 diabetes mellitus without complications: Secondary | ICD-10-CM | POA: Diagnosis not present

## 2018-04-18 NOTE — Progress Notes (Signed)
Medical Nutrition Therapy:  Appt start time: 2:00 end time:  3:00.  Assessment:  Primary concerns today:  Pt arrives stating she has been checking blood sugars 1-3 times a day: FBS (110-125) and after meals (100-150). Blood sugar numbers are doing well. Pt will have A1c taken on Thursday. Pt states Thanksgiving was uneventful. Pt states friend is doing low-carb eating but she is not influenced by it. Pt states she had her weight taken at recent doctor's appt and number was confirmation that Intuitive Eating is working for her. Pt states she listened to [place] about Eligha Bridegroom who wrote a book about eating "Loving my fat life, how dieting culture is making women less healthy". Pt states she has started taking [facility] bariatric capsule multivitamins today and has been taking 1-2 calcium supplements a day. Pt states she will start beating herself about smoking when she gets up. Pt states is taking losenges and has patches to help stop smoking. Pt states she has been trying to have breakfast in the morning, still working on including protein with it.   Pt states she is trying to prepare herself for when her mom comes next month. Pt states a lot of their conversations are about food.   MEDICATIONS: See list   DIETARY INTAKE:  Usual eating pattern includes 3 meals and 1 snacks per day.  Everyday foods include fast food.  Avoided foods include avoids bread, pasta, potatoes, and rice.    24-hr recall:  B: jello + fruit or peanut butter + banana + bread S:  L: pork tenderloin diablo + coffee + iced tea or ham and cheese sandwich + chips S: candy or cookies  D: ribs, noodles, spiral zucchini, red pesto + butter  Beverages: coffee, unsweetened tea, mello yello  Usual physical activity: none stated  Preferred Learning Style:   No preference indicated    Progress Towards Goal(s):  In progress.   Nutritional Diagnosis:  NB-1.5 Disordered eating pattern As related to binge eating.  As  evidenced by dietary recall.  Intervention:  Nutrition education and counseling. Pt was counseled on the importance of eating throughout the day to nourish her body. Pt was in agreement with goals listed.  Goals: - Leave pan on stove for cooking eggs in the morning.  - Keep up the great work!  Teaching Method Utilized:  Visual Auditory Hands on  Handouts given during visit include:  none  Demonstrated degree of understanding via:  Teach Back   Monitoring/Evaluation:  Dietary intake, exercise, and body weight in 1 week(s).

## 2018-04-21 ENCOUNTER — Other Ambulatory Visit: Payer: Self-pay | Admitting: Physician Assistant

## 2018-04-21 DIAGNOSIS — H9313 Tinnitus, bilateral: Secondary | ICD-10-CM

## 2018-04-26 ENCOUNTER — Encounter: Payer: Medicaid Other | Admitting: Registered"

## 2018-04-26 DIAGNOSIS — E119 Type 2 diabetes mellitus without complications: Secondary | ICD-10-CM

## 2018-04-26 NOTE — Patient Instructions (Addendum)
-   Continue doing a phenomenal job Therapist, occupational food police.   - Check out [employer] "Eating for the Culture" podcast.

## 2018-04-26 NOTE — Progress Notes (Signed)
Medical Nutrition Therapy:  Appt start time: 9:12 end time:  9:58.  Assessment:  Primary concerns today:  Pt arrives stating her mom is here visiting. Pt states the conversations are about food a lot when mom is here. Pt states she had fries with gravy when eating out with mom. Pt states she challenged mom's comments about her meal lacking protein. Pt states she and mom have been doing a lot of baking. Pt states conversations about food no longer make her obsessed about food; doesn't bother her as much anymore. Pt states she is eating popcorn or chips at night to fall asleep, challenging thoughts and hunger feelings at that time. Pt states she is trying to break thoughts that are still present but they don't produce anxiety as much anymore. Pt states she grew up snacking at night.   Pt states she does still feel some anxiety when she had 2 meatballs left on the plate and she was at the point of fullness.   Pt reports recent A1c (Aug) 8.2 and a decrease from (May) 8.7. Pt states she is waiting to see what recent A1c is from last week.    MEDICATIONS: See list   DIETARY INTAKE:  Usual eating pattern includes 3 meals and 1 snacks per day.  Everyday foods include fast food.  Avoided foods include avoids bread, pasta, potatoes, and rice.    24-hr recall:  B: jello + fruit or peanut butter + banana + bread S:  L: pork tenderloin diablo + coffee + iced tea or ham and cheese sandwich + chips S: candy or cookies  D: ribs, noodles, spiral zucchini, red pesto + butter  S: chips or popcorn or bbq chicken + macaroni and cheese Beverages: coffee, unsweetened tea, mello yello  Usual physical activity: none stated  Preferred Learning Style:   No preference indicated    Progress Towards Goal(s):  In progress.   Nutritional Diagnosis:  NB-1.5 Disordered eating pattern As related to binge eating.  As evidenced by dietary recall.  Intervention:  Nutrition education and counseling. Pt was  encouraged to keep up the great work challenging food rules and family members. Pt was counseled on intuitive eating and provided virtual resources. Pt was in agreement with goals listed.  Goals: - Continue doing a phenomenal job Therapist, occupational food police.  - Check out Intuitive "Eating for the Culture" podcast.   Teaching Method Utilized:  Visual Auditory Hands on  Handouts given during visit include:  none  Demonstrated degree of understanding via:  Teach Back   Monitoring/Evaluation:  Dietary intake, exercise, and body weight in 1 week(s).

## 2018-05-03 ENCOUNTER — Encounter: Payer: Self-pay | Admitting: Registered"

## 2018-05-03 ENCOUNTER — Encounter: Payer: Medicaid Other | Admitting: Registered"

## 2018-05-03 DIAGNOSIS — E119 Type 2 diabetes mellitus without complications: Secondary | ICD-10-CM

## 2018-05-03 NOTE — Patient Instructions (Addendum)
-   [place] are doing great with intuitive eating.

## 2018-05-03 NOTE — Progress Notes (Signed)
Medical Nutrition Therapy:  Appt start time: 10:10 end time:  11:10  Assessment:  Primary concerns today:  Pt arrives stating her mom is here visiting. Pt states she is not eating much because she has been busy. Pt states she has not been eating much around mom. Pt states her friend's mom passed away, went to [facility]; missed therapist appointment but will see her again this week. Pt reports recent FBS (123) and after meals (117). Pt states she is having testing done to investigate possibility of multiple sclerosis. Pt is doing well challenging others telling her food rules about what she should and shouldn't eat; informs them of intuitive eating.   Pt reports recent A1c (Aug) 8.2 and a decrease from (May) 8.7. Pt states she is waiting to see what recent A1c is from last week.    MEDICATIONS: See list   DIETARY INTAKE:  Usual eating pattern includes 3 meals and 1 snacks per day.  Everyday foods include fast food.  Avoided foods include avoids bread, pasta, potatoes, and rice.    24-hr recall:  B : 1-2 cookies  S:  L: BLT S: cookies  D: ribs, noodles, spiral zucchini, red pesto + butter  S: chips or popcorn or bbq chicken + macaroni and cheese Beverages: water, coffee, unsweetened tea, mello yello  Usual physical activity: none stated  Preferred Learning Style:   No preference indicated    Progress Towards Goal(s):  In progress.   Nutritional Diagnosis:  NB-1.5 Disordered eating pattern As related to binge eating.  As evidenced by dietary recall.  Intervention:  Nutrition education and counseling. Pt was encouraged to keep up the great work challenging food rules and family members. Pt was in agreement with goals listed.  Goals: - [place] are doing great with intuitive eating.   Teaching Method Utilized:  Visual Auditory Hands on  Handouts given during visit include:  none  Demonstrated degree of understanding via:  Teach Back    Monitoring/Evaluation:  Dietary intake, exercise, and body weight in 1 week(s).

## 2018-05-04 ENCOUNTER — Ambulatory Visit
Admission: RE | Admit: 2018-05-04 | Discharge: 2018-05-04 | Disposition: A | Discharge status: Home / Self Care | Payer: Medicaid Other | Source: Ambulatory Visit | Attending: Physician Assistant | Admitting: Physician Assistant

## 2018-05-04 DIAGNOSIS — H9313 Tinnitus, bilateral: Secondary | ICD-10-CM

## 2018-05-04 MED ORDER — GADOBENATE DIMEGLUMINE 529 MG/ML IV SOLN
20.0000 mL | Freq: Once | INTRAVENOUS | Status: AC | PRN
  Administered 2018-05-04: 20 mL via INTRAVENOUS

## 2018-05-13 ENCOUNTER — Encounter: Payer: Self-pay | Admitting: Registered"

## 2018-05-13 ENCOUNTER — Encounter: Payer: Medicaid Other | Admitting: Registered"

## 2018-05-13 DIAGNOSIS — E119 Type 2 diabetes mellitus without complications: Secondary | ICD-10-CM | POA: Diagnosis not present

## 2018-05-13 NOTE — Progress Notes (Signed)
Medical Nutrition Therapy:  Appt start time: 9:50 end time:  10:50  Assessment:  Primary concerns today:  Pt states she has not been checking BS due to increased intake of more holiday food and less nutrient-dense food over the last few days. Pt states she feels guilty about things she has eaten over the last few days. Pt states she has been scatter brained lately due to changes, holidays and mom leaving tomorrow. Pt states she will needs to relearn nutrition info once mom leaves tomorrow. Pt states she likes not thinking about food. Pt states she realizes her phone is a big distractor. Pt enjoys providing for others.    Pt reports recent A1c (Aug) 8.2 and a decrease from (May) 8.7. Pt states she is waiting to see what recent A1c is from last week.    MEDICATIONS: See list   DIETARY INTAKE:  Usual eating pattern includes 3 meals and 1 snacks per day.  Everyday foods include fast food.  Avoided foods include avoids bread, pasta, potatoes, and rice.    24-hr recall:  B : 2 donuts S:  L: BLT S: cookies  D: ribs, noodles, spiral zucchini, red pesto + butter  S: chips or popcorn or bbq chicken + macaroni and cheese Beverages: water, coffee, unsweetened tea, mello yello  Usual physical activity: none stated  Preferred Learning Style:   No preference indicated    Progress Towards Goal(s):  In progress.   Nutritional Diagnosis:  NB-1.5 Disordered eating pattern As related to binge eating.  As evidenced by dietary recall.  Intervention:  Nutrition education and counseling. Pt was encouraged to keep up the great work with intuitive eating. Pt was also encouraged to not feel guilty about her eating over the holidays and counseled on ways to increase intuitive eating knowledge during her spare time. Pt was in agreement with goals listed.  Goals: - Listen to podcasts on Intuitive Eating such as  - Food Psych by [place] for the Culture by Marcina Millard - Aim to listen to a podcast daily  Teaching Method Utilized:  Visual Auditory Hands on  Handouts given during visit include:  none  Demonstrated degree of understanding via:  Teach Back   Monitoring/Evaluation:  Dietary intake, exercise, and body weight in 1 week(s).

## 2018-05-13 NOTE — Patient Instructions (Addendum)
-   Listen to podcasts on Intuitive Eating such as  - Food Psych by [place]   - PCOS and [place] for the Culture by Marcina Millard  - Aim to listen to a podcast daily.

## 2018-05-27 ENCOUNTER — Encounter: Payer: Medicaid Other | Attending: Family Medicine | Admitting: Registered"

## 2018-05-27 ENCOUNTER — Encounter: Payer: Self-pay | Admitting: Registered"

## 2018-05-27 DIAGNOSIS — E119 Type 2 diabetes mellitus without complications: Secondary | ICD-10-CM | POA: Diagnosis present

## 2018-05-27 NOTE — Progress Notes (Signed)
Medical Nutrition Therapy:  Appt start time: 10:45 end time: 11:45  Assessment:  Primary concerns today: Pt arrives stating she had a dverticulitis flare up last week. Pt states she has a bluetooth device to listen to podcasts in the car. Pt states she has 2 new apps to listen to [place]. Pt states she has been watching social media videos that talk about what food items are better for you. Pt states she likes the information and likes to know they "why" behind things. Pt states her ultimate goal is to not think about food.   Pt states she realizes her phone is a big distractor. Pt enjoys providing for others.    Pt reports recent A1c (Aug) 8.2 and a decrease from (May) 8.7. Pt states she is waiting to see what recent A1c is from last week.    MEDICATIONS: See list   DIETARY INTAKE:  Usual eating pattern includes 3 meals and 1 snacks per day.  Everyday foods include fast food.  Avoided foods include avoids bread, pasta, potatoes, and rice.    24-hr recall:  B : 2 donuts S:  L: BLT S: cookies  D: ribs, noodles, spiral zucchini, red pesto + butter  S: chips or popcorn or bbq chicken + macaroni and cheese Beverages: water, coffee, unsweetened tea, mello yello  Usual physical activity: none stated  Preferred Learning Style:   No preference indicated    Progress Towards Goal(s):  In progress.   Nutritional Diagnosis:  NB-1.5 Disordered eating pattern As related to binge eating.  As evidenced by dietary recall.  Intervention:  Nutrition education and counseling. Pt was encouraged to keep up the great work with intuitive eating. Pt was also encouraged to increase intuitive eating knowledge during her spare time and challenging sources that categorizes food. Pt was in agreement with goals listed.  Goals: - Listen to podcasts on Intuitive Eating such as  - Food Psych by [place] for the Culture by Marcina Millard - Aim to listen to a podcast daily  Teaching Method Utilized:  Visual Auditory Hands on  Handouts given during visit include:  none  Demonstrated degree of understanding via:  Teach Back   Monitoring/Evaluation:  Dietary intake, exercise, and body weight in 1 week(s).

## 2018-06-01 ENCOUNTER — Ambulatory Visit: Payer: Self-pay | Admitting: Registered"

## 2018-06-02 ENCOUNTER — Encounter: Payer: Medicaid Other | Admitting: Registered"

## 2018-06-02 DIAGNOSIS — E119 Type 2 diabetes mellitus without complications: Secondary | ICD-10-CM

## 2018-06-02 NOTE — Progress Notes (Signed)
Medical Nutrition Therapy:  Appt start time: 2:00 end time: 3:00  Assessment:  Primary concerns today: Pt arrives stating she has been tired lately. Pt states wants to know about non-GMO foods. Pt states she knows she has not been eating vegetables lately. Pt states she recognizes it is hard for her to intuitively eat when she was out of town over the weekend. Pt states she likes to cook for others to help make their load light. Has not listened to Intuitive Eating podcasts yet.  Next visit: listen to podcasts and let me know what you think would be most helpful for me to know about it...  Pt states she realizes her phone is a big distractor. Pt enjoys providing for others.    Pt reports recent A1c (Aug) 8.2 and a decrease from (May) 8.7. Pt states she is waiting to see what recent A1c is from last week.   Mental health diagnosis: BED  MEDICATIONS: See list   DIETARY INTAKE:  Usual eating pattern includes 3 meals and 1 snacks per day.  Everyday foods include fast food.  Avoided foods include avoids bread, pasta, potatoes, and rice.    24-hr recall:  B : cheese toast + toast with jelly + coffee S: cheese + pretzels L: none S: cookies  D: grilled cheese + candy bar  S: chips or popcorn or bbq chicken + macaroni and cheese Beverages: water, coffee, unsweetened tea, soda  Usual physical activity: none stated  Preferred Learning Style:   No preference indicated    Progress Towards Goal(s):  In progress.   Nutritional Diagnosis:  NB-1.5 Disordered eating pattern As related to binge eating.  As evidenced by dietary recall.  Intervention:  Nutrition education and counseling. Pt was encouraged to keep up the great work with intuitive eating. Pt was also encouraged to increase intuitive eating knowledge during her spare time and challenging sources that categorizes food. Discussed diet culture focusing on fueling body with variety. Pt was in agreement with goals listed.  Goals: -  Listen to podcasts on Intuitive Eating such as  - Food Psych by [place] for the Culture by Marcina Millard - Aim to listen to a podcast daily - Have vegetables at least once a day.   Teaching Method Utilized:  Visual Auditory Hands on  Handouts given during visit include:  none  Demonstrated degree of understanding via:  Teach Back   Monitoring/Evaluation:  Dietary intake, exercise, and body weight in 1 week(s).

## 2018-06-02 NOTE — Patient Instructions (Addendum)
-   Listen to podcasts on Intuitive Eating such as  - Food Psych by [place] for the Culture by Marcina Millard - Aim to listen to a podcast daily.   - Have vegetables at least once a day.

## 2018-06-09 ENCOUNTER — Encounter: Payer: Medicaid Other | Admitting: Registered"

## 2018-06-09 DIAGNOSIS — E119 Type 2 diabetes mellitus without complications: Secondary | ICD-10-CM | POA: Diagnosis not present

## 2018-06-09 NOTE — Progress Notes (Signed)
Medical Nutrition Therapy:  Appt start time: 2:00 end time: 3:00  Assessment:  Primary concerns today: Pt report recent labs are LDL (133) and Trig (119). Pt reports recent A1c (Dec) 7.4 which decreased from (Aug) 8.2. Pt states recent diverticulitis flare-up improved with increased fiber intake of eating prunes and increasing water intake. Pt has started reading new book Intuitive Eating.   Next visit: listen to podcasts and let me know what you think would be most helpful for me to know about it...  Pt states she realizes her phone is a big distractor. Pt enjoys providing for others.        Mental health diagnosis: BED  MEDICATIONS: See list   DIETARY INTAKE:  Usual eating pattern includes 3 meals and 1 snacks per day.  Everyday foods include fast food.  Avoided foods include avoids bread, pasta, potatoes, and rice.    24-hr recall:  B : cheese toast + toast with jelly + coffee S: cheese + pretzels L: ice cream or [employer]-grilled chicken wrap + lettuce + mayo or meat loaf + scalloped potatoes + peas S: prunes D: lasagna + garlic bread  S: chips or popcorn or bbq chicken + macaroni and cheese Beverages: water, coffee, unsweetened tea, soda  Usual physical activity: none stated  Preferred Learning Style:   No preference indicated    Progress Towards Goal(s):  In progress.   Nutritional Diagnosis:  NB-1.5 Disordered eating pattern As related to binge eating.  As evidenced by dietary recall.  Intervention:  Nutrition education and counseling. Pt was encouraged to keep up the great work with intuitive eating. Pt was also encouraged to increase intuitive eating knowledge during her spare time and challenging sources that categorizes food. Discussed ways to increase some fiber to help with managing diverticulitis. Pt was in agreement with goals listed.  Goals: - Listen to podcasts on Intuitive Eating such as  - Food Psych by [place] for the Culture by Marcina Millard - Aim to listen to a podcast daily - Have vegetables at least once a day.   Teaching Method Utilized:  Visual Auditory Hands on  Handouts given during visit include:  none  Demonstrated degree of understanding via:  Teach Back   Monitoring/Evaluation:  Dietary intake, exercise, and body weight in 1 week(s).

## 2018-06-22 ENCOUNTER — Encounter: Payer: Medicaid Other | Attending: Family Medicine | Admitting: Registered"

## 2018-06-22 DIAGNOSIS — E119 Type 2 diabetes mellitus without complications: Secondary | ICD-10-CM | POA: Diagnosis present

## 2018-06-22 NOTE — Patient Instructions (Signed)
-   Keep up the great work with listening to podcasts in your free time. You are doing great!  - Track food intake.

## 2018-06-22 NOTE — Progress Notes (Signed)
Medical Nutrition Therapy:  Appt start time: 11:00 end time: 12:00  Assessment:  Primary concerns today:   Pt arrives stating she is currently taking prednisone and will start physical therapy soon. States she listened to intuitive eating podcast this morning. Pt states she was weighed yesterday for shoulder pain appt and did not see significance of it; bothered her a little. Pt reports currently looking for a PCP who is open to intuitive eating/HAES approach to healthcare. Reports still some nighttime eating before going to bed; unsure if its hunger or habit.   Next visit: what would be most helpful for me to know from podcast...  Pt states she realizes her phone is a big distractor. Pt enjoys providing for others.      Mental health diagnosis: BED  MEDICATIONS: See list   DIETARY INTAKE:  Usual eating pattern includes 3 meals and 1 snacks per day.  Everyday foods include fast food.  Avoided foods include avoids bread, pasta, potatoes, and rice.    24-hr recall:  B : yogurt  S: oranges L (2 pm): tortellini (lamb) + vegetable beef stew + chicken noodle pot pie  S: oranges or cucumbers D (8pm): tortellini (lamb) + vegetable beef stew + chicken noodle pot pie  S: chips or popcorn or bbq chicken + macaroni and cheese Beverages: water, coffee, unsweetened tea, soda  Usual physical activity: none stated  Preferred Learning Style:   No preference indicated    Progress Towards Goal(s):  In progress.   Nutritional Diagnosis:  NB-1.5 Disordered eating pattern As related to binge eating.  As evidenced by dietary recall.  Intervention:  Nutrition education and counseling. Pt was encouraged to keep up the great work with intuitive eating. Pt was also encouraged to increase intuitive eating knowledge during her spare time. Pt was in agreement with goals listed.  Goals: - Keep up the great work with listening to podcasts in your free time. You are doing great! - Track food intake.    Teaching Method Utilized:  Visual Auditory Hands on  Handouts given during visit include:  none  Demonstrated degree of understanding via:  Teach Back   Monitoring/Evaluation:  Dietary intake, exercise, and body weight in 1 week(s).

## 2018-06-23 ENCOUNTER — Ambulatory Visit: Payer: Medicaid Other | Attending: Family Medicine

## 2018-06-23 ENCOUNTER — Other Ambulatory Visit: Payer: Self-pay

## 2018-06-23 DIAGNOSIS — M5412 Radiculopathy, cervical region: Secondary | ICD-10-CM | POA: Insufficient documentation

## 2018-06-23 DIAGNOSIS — M25511 Pain in right shoulder: Secondary | ICD-10-CM

## 2018-06-23 DIAGNOSIS — M6281 Muscle weakness (generalized): Secondary | ICD-10-CM | POA: Diagnosis present

## 2018-06-23 DIAGNOSIS — R252 Cramp and spasm: Secondary | ICD-10-CM | POA: Diagnosis present

## 2018-06-23 NOTE — Therapy (Signed)
[facility] [address] [place], [place], [address] Phone: [phone]   Fax:  [phone]  Physical Therapy Evaluation  Patient Details  Name: Alice Simpson MRN: [id number] Date of Birth: July 12, 1965 Referring Provider (PT): Lujean Amel, MD   Encounter Date: 06/23/2018  PT End of Session - 06/23/18 1006    Visit Number  1    Date for PT Re-Evaluation  08/18/18    Authorization Type  Medicaid    PT Start Time  0934    PT Stop Time  1007    PT Time Calculation (min)  33 min    Activity Tolerance  Patient tolerated treatment well    Behavior During Therapy  [facility] for tasks assessed/performed       Past Medical History:  Diagnosis Date  . Anxiety   . Depression   . Diabetes mellitus without complication ([place])   . Hyperlipidemia   . Migraine   . Neuropathy   . Osteoarthritis   . PCOS (polycystic ovarian syndrome)     Past Surgical History:  Procedure Laterality Date  . BUNIONECTOMY    . CESAREAN SECTION    . LAPAROSCOPIC GASTRIC SLEEVE RESECTION N/A 09/24/2014   Procedure: LAPAROSCOPIC GASTRIC SLEEVE RESECTION upper endoscopy;  Surgeon: Johnathan Hausen, MD;  Location: [facility];  Service: General;  Laterality: N/A;  . TOTAL HIP ARTHROPLASTY Right 11/20/2014  . TOTAL HIP ARTHROPLASTY Right 11/20/2014   Procedure: TOTAL HIP ARTHROPLASTY ANTERIOR APPROACH;  Surgeon: Melrose Nakayama, MD;  Location: [place];  Service: Orthopedics;  Laterality: Right;  . wisdom    . WISDOM TOOTH EXTRACTION      There were no vitals filed for this visit.   Subjective Assessment - 06/23/18 0939    Subjective  Pt is a Lt hand dominant female who presents to PT with Rt shoulder/UE radiculopathy that began 04/2018 without cause.  Pt woke up 2 weeks ago with Rt sided neck pain then woke up 2 days later and was not able to lift her arm.      Pertinent History  Rt hip replacement 2016    Diagnostic tests  MRI: negative    Patient Stated  Goals  reduce pain, improve use of Rt UE    Currently in Pain?  Yes    Pain Score  6     Pain Location  Neck    Pain Orientation  Right    Pain Descriptors / Indicators  Aching;Heaviness;Throbbing;Nagging    Pain Type  Chronic pain    Pain Onset  More than a month ago    Pain Frequency  Constant    Aggravating Factors   use of Rt arm, daily tasks, sleep at night, turning head with driving    Pain Relieving Factors  heat, not using arm, not turning head         [facility] PT Assessment - 06/23/18 0001      Assessment   Medical Diagnosis  impingement syndrome of Rt shoulder, Rt cervical radiculopathy    Referring Provider (PT)  Koirala, Dibas, MD    Onset Date/Surgical Date  04/19/18   approximate   Hand Dominance  Left    Prior Therapy  none       Precautions   Precautions  None      Restrictions   Weight Bearing Restrictions  No      Balance Screen   Has the patient fallen in the past 6 months  No  Has the patient had a decrease in activity level because of a fear of falling?   No    Is the patient reluctant to leave their home because of a fear of falling?   No      Home Environment   Living Environment  Private residence    Living Arrangements  Alone    Type of [place] to enter    Entrance Stairs-Number of Steps  1    Home Layout  One level      Prior Function   Level of Independence  Independent    Vocation  On disability    Leisure  none      Cognition   Overall Cognitive Status  Within Functional Limits for tasks assessed      Observation/Other Assessments   Focus on Therapeutic Outcomes (FOTO)   --   NA due to Medicaid     Posture/Postural Control   Posture/Postural Control  Postural limitations    Postural Limitations  Forward head;Rounded Shoulders      ROM / Strength   AROM / PROM / Strength  AROM;Strength      AROM   Overall AROM   Within functional limits for tasks performed    Overall AROM Comments  UE A/ROM is full.   Cervical A/ROM is full with signifcant pain at end range in all directions.        Strength   Overall Strength  Deficits    Overall Strength Comments  cervical strength 4/5.  Lt UE 5/5, Rt UE 4/5 with pain      Palpation   Spinal mobility  25% reduction in PA mobs in the cervical and thoracic spine.    Palpation comment  significant palpable tenderness and trigger points over Rt shoulder external rotators, upper traps, Rt cervical paraspinals and rhomboids      Special Tests    Special Tests  Cervical    Cervical Tests  Dictraction      Distraction Test   Findngs  Negative                Objective measurements completed on examination: See above findings.              PT Education - 06/23/18 1002    Education Details   Access Code: [email or web address]     Person(s) Educated  Patient    Methods  Explanation;Demonstration;Handout    Comprehension  Verbalized understanding;Returned demonstration       PT Short Term Goals - 06/23/18 0943      PT SHORT TERM GOAL #1   Title  be independent in initial HEP    Time  4    Period  Weeks    Status  New    Target Date  07/21/18      PT SHORT TERM GOAL #2   Title  report a 25% reduction in Rt UE pain with ADLs and self-care    Time  4    Period  Weeks    Status  New    Target Date  07/21/18        PT Long Term Goals - 06/23/18 1106      PT LONG TERM GOAL #1   Title  be independent in advanced HEP    Time  8    Period  Weeks    Status  New    Target Date  08/18/18  PT LONG TERM GOAL #2   Title  demonstrate neutral seated posture with scapular retraction to reduce Rt shoulder impingement symptoms    Time  8    Period  Weeks    Status  New    Target Date  08/18/18      PT LONG TERM GOAL #3   Title  demonstrate 4+/5 Rt shoulder strength to improve endurance with use    Time  8    Period  Weeks    Status  New    Target Date  08/18/18      PT LONG TERM GOAL #4   Title  report a 75% reduction in the  frequency and intensity of Rt UE radiculopathy with sleep at night    Time  8    Period  Weeks    Status  New    Target Date  08/18/18             Plan - 06/23/18 1115    Clinical Impression Statement  Pt presents to PT with onset of Rt UE radiculopathy, weakness and Rt sided cervical pain that began 04/2018 without cause.  Pt reports that she has had episodes of Rt UE weakness with use over the past month.  Pt demonstrates forward head and rounded shoulder posture, Rt UE and cervical strength deficits (4/5), reduced PA cervical and thoracic segmental mobility and significant tenderness and trigger points over Rt upper traps, cervical paraspinals, rhomboids and shoulder external rotators.  Pt reports 6/10 Rt UE pain and neck pain today and UE radiculopathy that disrupts sleep.  Pt will benefit from skilled PT for strength, manual to address trigger points, cervical traction to address UE radiculopathy and weakness and postural education to improve impingement symptoms.      History and Personal Factors relevant to plan of care:  depression, Rt total hip replacement, chronic LBP    Clinical Presentation  Evolving    Clinical Presentation due to:  worsening weakness in Rt UE    Clinical Decision Making  Moderate    Rehab Potential  Good    PT Frequency  2x / week    PT Duration  8 weeks    PT Treatment/Interventions  ADLs/Self Care Home Management;Electrical Stimulation;Cryotherapy;Moist Heat;Traction;Ultrasound;Therapeutic exercise;Therapeutic activities;Iontophoresis 4mg /ml Dexamethasone;Patient/family education;Manual techniques;Passive range of motion;Dry needling;Taping    PT Next Visit Plan  dry needling for Rt neck, rhomboids and posterior shoulder, cervical flexion, review HEP    PT Home Exercise Plan  Access Code: [email or web address]    Consulted and Agree with Plan of Care  Patient       Patient will benefit from skilled therapeutic intervention in order to improve the following deficits  and impairments:  Pain, Impaired flexibility, Decreased activity tolerance, Decreased strength, Impaired UE functional use, Postural dysfunction, Increased muscle spasms, Improper body mechanics  Visit Diagnosis: Acute pain of right shoulder - Plan: PT plan of care cert/re-cert  Radiculopathy, cervical region - Plan: PT plan of care cert/re-cert  Cramp and spasm - Plan: PT plan of care cert/re-cert  Muscle weakness (generalized) - Plan: PT plan of care cert/re-cert     Problem List Patient Active Problem List   Diagnosis Date Noted  . Primary osteoarthritis of right hip 11/20/2014  . Morbid obesity ([place]) 11/20/2014  . S/P laparoscopic sleeve gastrectomy 09/24/2014  . PCOS (polycystic ovarian syndrome) 08/02/2013  . Depressive disorder 05/26/2013  . Type 2 diabetes mellitus, uncontrolled ([place]) 05/26/2013  . Hyperlipidemia 05/26/2013  . Low back pain 05/26/2013  .  Migraine 05/26/2013    Sigurd Sos, PT 06/23/18 11:24 AM  [facility] [address], [place], [place], [address] Phone: [phone]   Fax:  [phone]  Name: Alice Simpson MRN: [id number] Date of Birth: 05-12-1966

## 2018-06-23 NOTE — Patient Instructions (Signed)
Access Code: [email or web address]  URL: [email or web address]  Date: 06/23/2018  Prepared by: Sigurd Sos   Exercises  Seated Cervical Flexion AROM - 3 reps - 1 sets - 20 hold - 3x daily - 7x weekly  Seated Cervical Sidebending AROM - 3 reps - 1 sets - 20 hold - 3x daily - 7x weekly  Seated Cervical Rotation AROM - 3 reps - 1 sets - 20 hold - 3x daily - 7x weekly  Seated Correct Posture - 10 reps - 3 sets - 1x daily - 7x weekly

## 2018-06-29 ENCOUNTER — Ambulatory Visit: Payer: Medicaid Other | Admitting: Physical Therapy

## 2018-06-29 ENCOUNTER — Encounter: Payer: Medicaid Other | Admitting: Registered"

## 2018-06-29 DIAGNOSIS — E119 Type 2 diabetes mellitus without complications: Secondary | ICD-10-CM

## 2018-06-29 NOTE — Progress Notes (Signed)
Medical Nutrition Therapy:  Appt start time: 11:35 end time: 12:25  Assessment:  Primary concerns today:   Pt arrives stating she will start physical therapy today for neck discomfort. Pt states she thinks she may need something to help sleep at night. Pt states she completed food log for a few days. Realizes she has has a poverty mindset when she eats; grew up in low-income environment. Pt reports she has food triggers from childhood. Pt reports having a friend currently  intermittent fasting. Pt likes to hear podcasts that teach how to cook meals.   Pt reports currently looking for a PCP who is open to intuitive eating/HAES approach to healthcare. Reports still some nighttime eating before going to bed; unsure if its hunger or habit.   Next visit: what would be most helpful for me to know from podcast...  Pt states she realizes her phone is a big distractor. Pt enjoys providing for others.      Mental health diagnosis: BED  MEDICATIONS: See list   DIETARY INTAKE:  Usual eating pattern includes 3 meals and 1 snacks per day.  Everyday foods include fast food.  Avoided foods include avoids bread, pasta, potatoes, and rice.    24-hr recall:  B (9:30am): sometimes skips; yogurt + prunes S: sausage and egg McMuffin L (2 pm): chicken noodle soup or chicken salad sandwich + popcorn  S (6:30 pm): cookies  D: sometimes skips; chicken salad sandwich  S: almonds + chocolate Beverages: water, coffee, unsweetened tea, soda  Usual physical activity: none stated  Preferred Learning Style:   No preference indicated    Progress Towards Goal(s):  In progress.   Nutritional Diagnosis:  NB-1.5 Disordered eating pattern As related to binge eating.  As evidenced by dietary recall.  Intervention:  Nutrition education and counseling. Pt was encouraged to keep up the great work with intuitive eating. Discussed listening to intuitive eating podcasts in spare time and food logging.  Teaching  Method Utilized:  Visual Auditory Hands on  Handouts given during visit include:  none  Demonstrated degree of understanding via:  Teach Back   Monitoring/Evaluation:  Dietary intake, exercise, and body weight in 1 week(s).

## 2018-07-06 ENCOUNTER — Encounter

## 2018-07-06 ENCOUNTER — Ambulatory Visit: Payer: Self-pay | Admitting: Registered"

## 2018-07-06 ENCOUNTER — Encounter: Payer: Self-pay | Admitting: Neurology

## 2018-07-06 ENCOUNTER — Ambulatory Visit: Payer: Medicaid Other | Admitting: Neurology

## 2018-07-06 ENCOUNTER — Telehealth: Payer: Self-pay | Admitting: Neurology

## 2018-07-06 VITALS — BP 128/80 | HR 83 | Ht 68.0 in | Wt 308.0 lb

## 2018-07-06 DIAGNOSIS — G43109 Migraine with aura, not intractable, without status migrainosus: Secondary | ICD-10-CM

## 2018-07-06 DIAGNOSIS — I679 Cerebrovascular disease, unspecified: Secondary | ICD-10-CM

## 2018-07-06 DIAGNOSIS — Z5181 Encounter for therapeutic drug level monitoring: Secondary | ICD-10-CM | POA: Diagnosis not present

## 2018-07-06 DIAGNOSIS — H93A9 Pulsatile tinnitus, unspecified ear: Secondary | ICD-10-CM | POA: Diagnosis not present

## 2018-07-06 HISTORY — DX: Pulsatile tinnitus, unspecified ear: H93.A9

## 2018-07-06 NOTE — Telephone Encounter (Signed)
medicaid order sent to GI. They will obtain the auth and reach out to the pt to schedule.  °

## 2018-07-06 NOTE — Progress Notes (Signed)
Reason for visit: Pulsatile tinnitus  Referring physician: Dr. Pricilla Riffle is a 53 y.o. female  History of present illness:  Ms. Tuch is a 53 year old left-handed white female with a history of morbid obesity and diabetes and hypertension.  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  The patient will not take any medication for it, she would lie down and let the headache pass.  The patient has a tendency for motion sickness.  One year ago, she began having episodes that may last up to a week of pulsatile tinnitus that seems to be irregular, and is not correlating with her pulse.  She has not checked her blood pressure during these events.  The episodes may be so loud that she is unable to sleep.  The pulsations occur in both ears, occasionally she may have a clicking noise in the left ear only.  She has been seen through ENT, audiometric testing was normal she claims.  MRI of the brain was done and shows nonspecific white matter changes, no acute changes were seen.  A vascular work-up was not undertaken.  The patient reports some numbness in the left fourth and fifth fingers and in the saphenous nerve distribution on the right leg.  The patient has a right hip problem as well.  She does have low back pain.  She reports no difficulty controlling the bowels or the bladder, she has not had any falls.  She did have some right arm weakness that improved with physical therapy.  Between episodes of pulsatile tinnitus, the patient has no difficulty with hearing, she may have difficulty during the events.  She does not correlate her headaches and vertigo with the pulsatile tinnitus.  She is sent to this office for an evaluation.  She has been seen by an ophthalmologist in August 2019, no papilledema was noted.  Past Medical  History:  Diagnosis Date  . ADD (attention deficit disorder)   . Anxiety   . Arthritis   . Depression   . Diabetes mellitus without complication ([place])   . Hyperlipidemia   . Migraine   . Neuropathy   . Neuropathy   . Osteoarthritis   . PCOS (polycystic ovarian syndrome)   . Vertigo     Past Surgical History:  Procedure Laterality Date  . BUNIONECTOMY    . CESAREAN SECTION    . LAPAROSCOPIC GASTRIC SLEEVE RESECTION N/A 09/24/2014   Procedure: LAPAROSCOPIC GASTRIC SLEEVE RESECTION upper endoscopy;  Surgeon: Johnathan Hausen, MD;  Location: [facility];  Service: General;  Laterality: N/A;  . TOTAL HIP ARTHROPLASTY Right 11/20/2014  . TOTAL HIP ARTHROPLASTY Right 11/20/2014   Procedure: TOTAL HIP ARTHROPLASTY ANTERIOR APPROACH;  Surgeon: Melrose Nakayama, MD;  Location: [place];  Service: Orthopedics;  Laterality: Right;  . wisdom    . WISDOM TOOTH EXTRACTION      Family History  Problem Relation Age of Onset  . Diabetes Other   . Depression Other   . Anxiety disorder Other   . High blood pressure Mother   . High Cholesterol Mother   . Diabetes Mother   . Diabetes Father   . High blood pressure Father   . High Cholesterol Father     Social history:  reports that she quit smoking about 14 years ago. Her smoking use included cigarettes. She has a 25.00 pack-year  smoking history. She has never used smokeless tobacco. She reports current alcohol use. She reports current drug use. Drug: Marijuana.  Medications:  Prior to Admission medications   Medication Sig Start Date End Date Taking? Authorizing Provider  busPIRone (BUSPAR) 10 MG tablet Take 10 mg by mouth 2 (two) times daily. 06/13/18  Yes [provider]  Calcium Citrate-Vitamin D (CALCIUM CITRATE + PO) Take 600 mg by mouth 3 (three) times daily.   Yes [provider]  DULoxetine (CYMBALTA) 30 MG capsule Take 40 mg by mouth daily.    Yes [provider]  glipiZIDE (GLUCOTROL XL) 5 MG 24 hr tablet Take 5 mg by  mouth daily. with food 06/13/18  Yes [provider]  Lactase (LACTAID PO) Take 1 tablet by mouth daily as needed (before eating d).   Yes [provider]  lisdexamfetamine (VYVANSE) 10 MG capsule Take 10 mg by mouth daily. 1-2 tablets in the am   Yes [provider]  LORazepam (ATIVAN) 0.5 MG tablet Take 0.5 mg by mouth every 8 (eight) hours.   Yes [provider]  metFORMIN (GLUCOPHAGE-XR) 500 MG 24 hr tablet Take 1 tablet (500 mg total) by mouth 2 (two) times daily. Patient taking differently: Take 500 mg by mouth. 3 tablets with evening meal 09/26/14  Yes Philemon Kingdom, MD  Multiple Vitamin (MULTI-VITAMINS) TABS Take 1 tablet by mouth 2 (two) times daily.    Yes [provider]  norethindrone (MICRONOR,CAMILA,ERRIN) 0.35 MG tablet Take 1 tablet (0.35 mg total) by mouth daily. Patient taking differently: Take 1 tablet by mouth at bedtime.  06/09/13  Yes Philemon Kingdom, MD  rosuvastatin (CRESTOR) 10 MG tablet Take 20 mg by mouth daily.    Yes [provider]  Vitamin D, Ergocalciferol, (DRISDOL) 1.25 MG (50000 UT) CAPS capsule Take 50,000 Units by mouth once a week. 06/13/18  Yes [provider]  ondansetron (ZOFRAN) 4 MG tablet Take 1 tablet (4 mg total) by mouth every 6 (six) hours. Patient not taking: Reported on 07/06/2018 12/16/17   Okey Regal, PA-C      Allergies  Allergen Reactions  . Lactose Intolerance (Gi)   . Penicillin G Hives    hives  . Prochlorperazine Itching and Anxiety    ROS:  Out of a complete 14 system review of symptoms, the patient complains only of the following symptoms, and all other reviewed systems are negative.  Fatigue Ringing in the ears, dizziness Feeling hot, cold Joint pain Incontinence of the bladder Headache, numbness, weakness Depression, anxiety Insomnia, sleepiness  Blood pressure 128/80, pulse 83, height 5\' 8"  (1.727 m), weight (!) 308 lb (139.7 kg), SpO2 97  %.  Physical Exam  General: The patient is alert and cooperative at the time of the examination.  The patient is morbidly obese.  Eyes: Pupils are equal, round, and reactive to light. Discs are flat bilaterally.  Ears: Tympanic membranes are clear bilaterally.  Neck: The neck is supple, no carotid bruits are noted.  Respiratory: The respiratory examination is clear.  Cardiovascular: The cardiovascular examination reveals a regular rate and rhythm, no obvious murmurs or rubs are noted.  Skin: Extremities are without significant edema.  Neurologic Exam  Mental status: The patient is alert and oriented x 3 at the time of the examination. The patient has apparent normal recent and remote memory, with an apparently normal attention span and concentration ability.  Cranial nerves: Facial symmetry is present. There is good sensation of the face to pinprick  and soft touch bilaterally. The strength of the facial muscles and the muscles to head turning and shoulder shrug are normal bilaterally. Speech is well enunciated, no aphasia or dysarthria is noted. Extraocular movements are full. Visual fields are full. The tongue is midline, and the patient has symmetric elevation of the soft palate. No obvious hearing deficits are noted.  Motor: The motor testing reveals 5 over 5 strength of all 4 extremities. Good symmetric motor tone is noted throughout.  Sensory: Sensory testing is intact to pinprick, soft touch, vibration sensation, and position sense on all 4 extremities. No evidence of extinction is noted.  Coordination: Cerebellar testing reveals good finger-nose-finger and heel-to-shin bilaterally.  Gait and station: Gait is normal. Tandem gait is slightly unsteady. Romberg is negative. No drift is seen.  Reflexes: Deep tendon reflexes are symmetric and normal bilaterally. Toes are downgoing bilaterally.   MRI brain 05/05/18:  IMPRESSION: 1. No explanation for symptoms. No retrocochlear  lesion or vascular finding. 2. Remote insults in the cerebral white matter to a mild degree. These are nonspecific but often from chronic small vessel ischemic insults. There is a right middle cerebellar peduncle signal abnormality, but supratentorial pattern is not typical for Demyelination.  * MRI scan images were reviewed online. I agree with the written report.    Assessment/Plan:  1.  Intermittent pulsatile tinnitus  2.  History of vertebrobasilar migraine  3.  Mild white matter changes by MRI brain  4.  Diabetes  5.  Hypertension  6.  Morbid obesity  The patient does have risk factors for cerebrovascular disease.  For this reason, CT angiogram of the head and neck will be done.  The patient however indicates that the pulsatile tinnitus does not correlate with her actual blood pressure pulses.  She has not checked a blood pressure during her symptomatic periods,  I would recommend that she do this.  I am not clear that the pulsatile tinnitus is fully treatable, but we do need to exclude significant causal abnormalities that may be associated with this.  Jill Alexanders MD 07/06/2018 8:33 AM  [facility] [address] [place], [place] [address]  Phone [phone] Fax [phone]

## 2018-07-07 LAB — COMPREHENSIVE METABOLIC PANEL
A/G RATIO: 1.7 (ref 1.2–2.2)
ALK PHOS: 94 IU/L (ref 39–117)
ALT: 25 IU/L (ref 0–32)
AST: 27 IU/L (ref 0–40)
Albumin: 4.5 g/dL (ref 3.8–4.9)
BILIRUBIN TOTAL: 0.4 mg/dL (ref 0.0–1.2)
BUN/Creatinine Ratio: 12 (ref 9–23)
BUN: 8 mg/dL (ref 6–24)
CHLORIDE: 102 mmol/L (ref 96–106)
CO2: 23 mmol/L (ref 20–29)
Calcium: 9.8 mg/dL (ref 8.7–10.2)
Creatinine, Ser: 0.69 mg/dL (ref 0.57–1.00)
GFR calc Af Amer: 116 mL/min/{1.73_m2} (ref >59)
GFR calc non Af Amer: 100 mL/min/{1.73_m2} (ref >59)
GLOBULIN, TOTAL: 2.6 g/dL (ref 1.5–4.5)
Glucose: 145 mg/dL — ABNORMAL HIGH (ref 65–99)
POTASSIUM: 4.6 mmol/L (ref 3.5–5.2)
SODIUM: 140 mmol/L (ref 134–144)
Total Protein: 7.1 g/dL (ref 6.0–8.5)

## 2018-07-07 LAB — SEDIMENTATION RATE: Sed Rate: 14 mm/hr (ref 0–40)

## 2018-07-11 ENCOUNTER — Ambulatory Visit: Payer: Medicaid Other

## 2018-07-11 DIAGNOSIS — R252 Cramp and spasm: Secondary | ICD-10-CM

## 2018-07-11 DIAGNOSIS — M5412 Radiculopathy, cervical region: Secondary | ICD-10-CM

## 2018-07-11 DIAGNOSIS — M6281 Muscle weakness (generalized): Secondary | ICD-10-CM

## 2018-07-11 DIAGNOSIS — M25511 Pain in right shoulder: Secondary | ICD-10-CM | POA: Diagnosis not present

## 2018-07-11 NOTE — Therapy (Signed)
[facility] [address], [place], [place], [address] Phone: [phone]   Fax:  [phone]  Physical Therapy Treatment  Patient Details  Name: Alice Simpson MRN: [id number] Date of Birth: [birth year] Referring Provider (PT): Lujean Amel, MD   Encounter Date: 07/11/2018  PT End of Session - 07/11/18 1148    Visit Number  2    Date for PT Re-Evaluation  08/18/18    Authorization Type  Medicaid: 3 visitis approved 2/10-07/24/18    Authorization - Visit Number  1    Authorization - Number of Visits  3    PT Start Time  1100    PT Stop Time  1203    PT Time Calculation (min)  63 min    Activity Tolerance  Patient tolerated treatment well    Behavior During Therapy  [facility] for tasks assessed/performed       Past Medical History:  Diagnosis Date  . ADD (attention deficit disorder)   . Anxiety   . Arthritis   . Depression   . Diabetes mellitus without complication ([place])   . Hyperlipidemia   . Migraine   . Neuropathy   . Neuropathy   . Osteoarthritis   . PCOS (polycystic ovarian syndrome)   . Pulsatile tinnitus 07/06/2018  . Vertigo     Past Surgical History:  Procedure Laterality Date  . BUNIONECTOMY    . CESAREAN SECTION    . LAPAROSCOPIC GASTRIC SLEEVE RESECTION N/A 09/24/2014   Procedure: LAPAROSCOPIC GASTRIC SLEEVE RESECTION upper endoscopy;  Surgeon: Johnathan Hausen, MD;  Location: [facility];  Service: General;  Laterality: N/A;  . TOTAL HIP ARTHROPLASTY Right 11/20/2014  . TOTAL HIP ARTHROPLASTY Right 11/20/2014   Procedure: TOTAL HIP ARTHROPLASTY ANTERIOR APPROACH;  Surgeon: Melrose Nakayama, MD;  Location: [place];  Service: Orthopedics;  Laterality: Right;  . wisdom    . WISDOM TOOTH EXTRACTION      There were no vitals filed for this visit.  Subjective Assessment - 07/11/18 1059    Subjective  I have felt better since I have been doing the stretches.  Today, I have having more pain.    Currently in  Pain?  Yes    Pain Score  3     Pain Location  Neck    Pain Descriptors / Indicators  Aching;Throbbing    Pain Onset  More than a month ago    Pain Frequency  Constant    Aggravating Factors   moving, turning head, sleep at night    Pain Relieving Factors  heat, not turning head                       [facility] Adult PT Treatment/Exercise - 07/11/18 0001      Exercises   Exercises  Neck      Modalities   Modalities  Traction      Traction   Type of Traction  Cervical    Min (lbs)  5    Max (lbs)  15    Hold Time  60    Rest Time  20    Time  15      Manual Therapy   Manual Therapy  Soft tissue mobilization;Myofascial release    Manual therapy comments  trigger point release and elongation to bil upper traps, cervical paraspinals and suboccipitals      Neck Exercises: Stretches   Other Neck Stretches  cervical A/ROM 3 ways 2x20 seconds  Trigger Point Dry Needling - 07/11/18 1106    Consent Given?  Yes    Education Handout Provided  Yes    Muscles Treated Upper Body  Upper trapezius;Suboccipitals muscle group   bil multifidi   Upper Trapezius Response  Twitch reponse elicited;Palpable increased muscle length    SubOccipitals Response  Twitch response elicited;Palpable increased muscle length           PT Education - 07/11/18 1105    Education Details  dry needling info    Person(s) Educated  Patient    Methods  Explanation;Demonstration;Handout    Comprehension  Verbalized understanding;Returned demonstration       PT Short Term Goals - 06/23/18 0943      PT SHORT TERM GOAL #1   Title  be independent in initial HEP    Time  4    Period  Weeks    Status  New    Target Date  07/21/18      PT SHORT TERM GOAL #2   Title  report a 25% reduction in Rt UE pain with ADLs and self-care    Time  4    Period  Weeks    Status  New    Target Date  07/21/18        PT Long Term Goals - 06/23/18 1106      PT LONG TERM GOAL #1   Title  be  independent in advanced HEP    Time  8    Period  Weeks    Status  New    Target Date  08/18/18      PT LONG TERM GOAL #2   Title  demonstrate neutral seated posture with scapular retraction to reduce Rt shoulder impingement symptoms    Time  8    Period  Weeks    Status  New    Target Date  08/18/18      PT LONG TERM GOAL #3   Title  demonstrate 4+/5 Rt shoulder strength to improve endurance with use    Time  8    Period  Weeks    Status  New    Target Date  08/18/18      PT LONG TERM GOAL #4   Title  report a 75% reduction in the frequency and intensity of Rt UE radiculopathy with sleep at night    Time  8    Period  Weeks    Status  New    Target Date  08/18/18            Plan - 07/11/18 1114    Clinical Impression Statement  Pt with first time follow-up after evaluation.  Pt reports significant pain reduction after beginning cervical stretches.  Pt requires verbal cues to stretch gently.  Pt with tension and trigger points in bil neck and upper traps and demonstrated improved tissue mobility and reduced stiffness after dry needling today.  Pt tolerated trial of traction well today.  Pt with neck tension, intermittent Rt UE radiculopathy and reduced A/ROM and will continue to benefit from skilled PT for flexibility, strength and manual as needed.      Rehab Potential  Good    PT Frequency  2x / week    PT Duration  8 weeks    PT Treatment/Interventions  ADLs/Self Care Home Management;Electrical Stimulation;Cryotherapy;Moist Heat;Traction;Ultrasound;Therapeutic exercise;Therapeutic activities;Iontophoresis 4mg /ml Dexamethasone;Patient/family education;Manual techniques;Passive range of motion;Dry needling;Taping    PT Next Visit Plan  assess response to dry needling and  traction, postural strength and Rt UE strength    PT Home Exercise Plan  Access Code: [email or web address]    Recommended Other Services  initial certification is signed    Consulted and Agree with Plan of Care   Patient       Patient will benefit from skilled therapeutic intervention in order to improve the following deficits and impairments:  Pain, Impaired flexibility, Decreased activity tolerance, Decreased strength, Impaired UE functional use, Postural dysfunction, Increased muscle spasms, Improper body mechanics  Visit Diagnosis: Acute pain of right shoulder  Radiculopathy, cervical region  Cramp and spasm  Muscle weakness (generalized)     Problem List Patient Active Problem List   Diagnosis Date Noted  . Pulsatile tinnitus 07/06/2018  . Primary osteoarthritis of right hip 11/20/2014  . Morbid obesity ([place]) 11/20/2014  . S/P laparoscopic sleeve gastrectomy 09/24/2014  . PCOS (polycystic ovarian syndrome) 08/02/2013  . Depressive disorder 05/26/2013  . Type 2 diabetes mellitus, uncontrolled ([place]) 05/26/2013  . Hyperlipidemia 05/26/2013  . Low back pain 05/26/2013  . Migraine 05/26/2013    Sigurd Sos, PT 07/11/18 11:57 AM  [facility] [address], [place], [place], [address] Phone: [phone]   Fax:  [phone]  Name: Alice Simpson MRN: [id number] Date of Birth: 1966/01/27

## 2018-07-11 NOTE — Telephone Encounter (Signed)
Medicaid Josem Kaufmann: [id number] (exp. 07/07/18 to 08/06/18) order sent to GI.

## 2018-07-11 NOTE — Patient Instructions (Signed)

## 2018-07-12 ENCOUNTER — Encounter: Payer: Medicaid Other | Admitting: Registered"

## 2018-07-12 DIAGNOSIS — F509 Eating disorder, unspecified: Secondary | ICD-10-CM

## 2018-07-12 DIAGNOSIS — E119 Type 2 diabetes mellitus without complications: Secondary | ICD-10-CM | POA: Diagnosis not present

## 2018-07-12 NOTE — Progress Notes (Signed)
Medical Nutrition Therapy:  Appt start time: 10:40 end time: 11:30  Assessment:  Primary concerns today:   Pt arrives stating she was sick and beating herself up about having chocolate while sick last week. Pt states she felt like she should have been doing a better job taking care of herself and being sensitive to her stomach eating things such as bread, soup, ginger ale, etc. Pt states recent doctor visit revealed some white spots on her brain which could indicate MS. Pt expressed interest in food items beneficial to brain health. Pt states intuitive eating has been very freeing for her; to have freedom with food and not think about what she is eating has been great. Pt reports listening to podcast this morning and also challenging things that are disrespectful and devalue her.   Next visit: what would be most helpful for me to know from podcast...  Pt states she realizes her phone is a big distractor. Pt enjoys providing for others.      Mental health diagnosis: BED  MEDICATIONS: See list   DIETARY INTAKE:  Usual eating pattern includes 3 meals and 1 snacks per day.  Everyday foods include fast food.  Avoided foods include avoids bread, pasta, potatoes, and rice.    24-hr recall:  B (9:30am): sometimes skips; yogurt + prunes S: sausage and egg McMuffin L (2 pm): chicken noodle soup or chicken salad sandwich + popcorn  S (6:30 pm): cookies  D: sometimes skips; chicken salad sandwich  S: almonds + chocolate Beverages: water, coffee, unsweetened tea, soda  Usual physical activity: none stated  Preferred Learning Style:   No preference indicated    Progress Towards Goal(s):  In progress.   Nutritional Diagnosis:  NB-1.5 Disordered eating pattern As related to binge eating.  As evidenced by dietary recall.  Intervention:  Nutrition education and counseling. Pt was encouraged to keep up the great work with intuitive eating, challenging opposing voices, and continue to create  freedom with food. Pt was encouraged to keep up the great work also with growing knowledge of intuitive eating in spare time.   Teaching Method Utilized:  Visual Auditory Hands on  Handouts given during visit include:  none  Demonstrated degree of understanding via:  Teach Back   Monitoring/Evaluation:  Dietary intake, exercise, and body weight in 1 week(s).

## 2018-07-14 ENCOUNTER — Ambulatory Visit: Payer: Medicaid Other

## 2018-07-14 DIAGNOSIS — M5412 Radiculopathy, cervical region: Secondary | ICD-10-CM

## 2018-07-14 DIAGNOSIS — R252 Cramp and spasm: Secondary | ICD-10-CM

## 2018-07-14 DIAGNOSIS — M25511 Pain in right shoulder: Secondary | ICD-10-CM

## 2018-07-14 DIAGNOSIS — M6281 Muscle weakness (generalized): Secondary | ICD-10-CM

## 2018-07-14 NOTE — Patient Instructions (Addendum)
Cervico-Thoracic: Extension / Rotation (Sitting)    Reach across body with left arm and grasp back of chair. Gently look over right side shoulder. Hold _20___ seconds. Relax. Repeat _3___ times per set. Do _1___ sets per session. Do _3___ sessions per day.  http://orth.exer.us/981   Copyright  VHI. All rights reserved.   Access Code: [email or web address]  URL: [email or web address]  Date: 07/14/2018  Prepared by: Sigurd Sos   Exercises  Seated Correct Posture - 10 reps - 3 sets - 1x daily - 7x weekly Scapular Retraction with Resistance - 10 reps - 2 sets - 2x daily - 7x weekly Seated Scapular Retraction - 10 reps - 1 sets - 5 hold - 5x daily - 7x weekly Shoulder extension with resistance - Neutral - 10 reps - 2 sets - 2x daily - 7x weekly Standing Shoulder External Rotation with Resistance - 10 reps - 2 sets - 2x daily - 7x weekly

## 2018-07-14 NOTE — Therapy (Addendum)
[facility] [address] [place], [place], [address] Phone: [phone]   Fax:  [phone]  Physical Therapy Treatment  Patient Details  Name: Alice Simpson MRN: [id number] Date of Birth: July 06, 1965 Referring Provider (PT): Lujean Amel, MD   Encounter Date: 07/14/2018  PT End of Session - 07/14/18 0844    Visit Number  3    Date for PT Re-Evaluation  08/18/18    Authorization Type  Medicaid: 3 visitis approved 2/10-07/24/18    Authorization - Visit Number  2    Authorization - Number of Visits  3    PT Start Time  0804    PT Stop Time  0900    PT Time Calculation (min)  56 min    Activity Tolerance  Patient tolerated treatment well    Behavior During Therapy  [facility] for tasks assessed/performed       Past Medical History:  Diagnosis Date  . ADD (attention deficit disorder)   . Anxiety   . Arthritis   . Depression   . Diabetes mellitus without complication ([place])   . Hyperlipidemia   . Migraine   . Neuropathy   . Neuropathy   . Osteoarthritis   . PCOS (polycystic ovarian syndrome)   . Pulsatile tinnitus 07/06/2018  . Vertigo     Past Surgical History:  Procedure Laterality Date  . BUNIONECTOMY    . CESAREAN SECTION    . LAPAROSCOPIC GASTRIC SLEEVE RESECTION N/A 09/24/2014   Procedure: LAPAROSCOPIC GASTRIC SLEEVE RESECTION upper endoscopy;  Surgeon: Johnathan Hausen, MD;  Location: [facility];  Service: General;  Laterality: N/A;  . TOTAL HIP ARTHROPLASTY Right 11/20/2014  . TOTAL HIP ARTHROPLASTY Right 11/20/2014   Procedure: TOTAL HIP ARTHROPLASTY ANTERIOR APPROACH;  Surgeon: Melrose Nakayama, MD;  Location: [facility];  Service: Orthopedics;  Laterality: Right;  . wisdom    . WISDOM TOOTH EXTRACTION      There were no vitals filed for this visit.  Subjective Assessment - 07/14/18 0808    Subjective  I feel a little bit looser.  Not a lot of change after needling.      Currently in Pain?  Yes    Pain Score  2      Pain Location  Neck    Pain Orientation  Right    Pain Descriptors / Indicators  Tightness                       [facility] Adult PT Treatment/Exercise - 07/14/18 0001      Exercises   Exercises  Shoulder      Neck Exercises: Machines for Strengthening   UBE (Upper Arm Bike)  Level 0 x 6 minutes (3/3)      Neck Exercises: Seated   Other Seated Exercise  cervicothoracic rotation 3x20 seconds bil.       Shoulder Exercises: Standing   External Rotation  Strengthening;Both;20 reps;Theraband    Theraband Level (Shoulder External Rotation)  Level 1 (Yellow)    Extension  Strengthening;Both;20 reps;Theraband    Theraband Level (Shoulder Extension)  Level 1 (Yellow)    Row  Strengthening;Both;20 reps;Theraband    Theraband Level (Shoulder Row)  Level 1 (Yellow)      Modalities   Modalities  Traction      Traction   Type of Traction  Cervical    Min (lbs)  5    Max (lbs)  17    Hold Time  60  Rest Time  20    Time  15      Manual Therapy   Manual Therapy  Soft tissue mobilization;Myofascial release    Manual therapy comments  Addaday with red attachment to Rt upper traps and rhomboids      Neck Exercises: Stretches   Other Neck Stretches  cervical A/ROM 3 ways 2x20 seconds             PT Education - 07/14/18 0823    Education Details  Access Code: [email or web address]    Person(s) Educated  Patient    Methods  Explanation;Demonstration;Handout    Comprehension  Verbalized understanding;Returned demonstration       PT Short Term Goals - 06/23/18 0943      PT SHORT TERM GOAL #1   Title  be independent in initial HEP    Time  4    Period  Weeks    Status  New    Target Date  07/21/18      PT SHORT TERM GOAL #2   Title  report a 25% reduction in Rt UE pain with ADLs and self-care    Time  4    Period  Weeks    Status  New    Target Date  07/21/18        PT Long Term Goals - 06/23/18 1106      PT LONG TERM GOAL #1   Title  be independent in  advanced HEP    Time  8    Period  Weeks    Status  New    Target Date  08/18/18      PT LONG TERM GOAL #2   Title  demonstrate neutral seated posture with scapular retraction to reduce Rt shoulder impingement symptoms    Time  8    Period  Weeks    Status  New    Target Date  08/18/18      PT LONG TERM GOAL #3   Title  demonstrate 4+/5 Rt shoulder strength to improve endurance with use    Time  8    Period  Weeks    Status  New    Target Date  08/18/18      PT LONG TERM GOAL #4   Title  report a 75% reduction in the frequency and intensity of Rt UE radiculopathy with sleep at night    Time  8    Period  Weeks    Status  New    Target Date  08/18/18            Plan - 07/14/18 0827    Clinical Impression Statement  Pt reports that she feels reduced stiffness since dry needling and traction last session.  Pt did well with addition of postural strength exercises today and required tactile cues for scapular depression with theraband exercise.  Pt with Rt neck pain with cervical A/ROM.  Pt with tension and trigger points in the Rt upper traps and rhomboids and demonstrated improved tissue mobility after manual therapy today.  Pt will continue to benefit from skilled PT for postural strength, flexibility, manual and traction to address neck pain and Rt UE radiculopathy.      PT Frequency  2x / week    PT Duration  8 weeks    PT Treatment/Interventions  ADLs/Self Care Home Management;Electrical Stimulation;Cryotherapy;Moist Heat;Traction;Ultrasound;Therapeutic exercise;Therapeutic activities;Iontophoresis '4mg'$ /ml Dexamethasone;Patient/family education;Manual techniques;Passive range of motion;Dry needling;Taping    PT Next Visit Plan  dry needling, renewal for  medicaid, traction, review HEP    PT Home Exercise Plan  Access Code: [email or web address]    Consulted and Agree with Plan of Care  Patient       Patient will benefit from skilled therapeutic intervention in order to improve the  following deficits and impairments:  Pain, Impaired flexibility, Decreased activity tolerance, Decreased strength, Impaired UE functional use, Postural dysfunction, Increased muscle spasms, Improper body mechanics  Visit Diagnosis: Acute pain of right shoulder  Radiculopathy, cervical region  Cramp and spasm  Muscle weakness (generalized)     Problem List Patient Active Problem List   Diagnosis Date Noted  . Pulsatile tinnitus 07/06/2018  . Primary osteoarthritis of right hip 11/20/2014  . Morbid obesity ([place]) 11/20/2014  . S/P laparoscopic sleeve gastrectomy 09/24/2014  . PCOS (polycystic ovarian syndrome) 08/02/2013  . Depressive disorder 05/26/2013  . Type 2 diabetes mellitus, uncontrolled ([place]) 05/26/2013  . Hyperlipidemia 05/26/2013  . Low back pain 05/26/2013  . Migraine 05/26/2013    Sigurd Sos, PT 07/14/18 8:46 AM PHYSICAL THERAPY DISCHARGE SUMMARY  Visits from Start of Care: 3  Current functional level related to goals / functional outcomes: Pt completed 3 PT sessions and didn't return to PT.  See above for most current PT status.     Remaining deficits: See above.   Education / Equipment: HEP, posture/body mechanics Plan: Patient agrees to discharge.  Patient goals were not met. Patient is being discharged due to not returning since the last visit.  ?????    Sigurd Sos, PT 08/21/18 12:28 PM    [facility] [address], [place], [place], [address] Phone: [phone]   Fax:  [phone]  Name: ISABELLAMARIE RANDA MRN: [id number] Date of Birth: January 20, 1966

## 2018-07-19 ENCOUNTER — Other Ambulatory Visit: Payer: Self-pay

## 2018-07-19 ENCOUNTER — Ambulatory Visit: Payer: Self-pay | Admitting: Registered"

## 2018-07-20 ENCOUNTER — Ambulatory Visit: Payer: Self-pay

## 2018-07-26 ENCOUNTER — Encounter: Payer: Medicaid Other | Attending: Family Medicine | Admitting: Registered"

## 2018-07-26 DIAGNOSIS — F509 Eating disorder, unspecified: Secondary | ICD-10-CM

## 2018-07-26 DIAGNOSIS — E119 Type 2 diabetes mellitus without complications: Secondary | ICD-10-CM | POA: Insufficient documentation

## 2018-07-26 NOTE — Progress Notes (Signed)
Medical Nutrition Therapy:  Appt start time: 3:00 end time: 3:42  Assessment:  Primary concerns today:   Pt arrives stating she is recovering from the flu and still getting energy back. Pt states she was eating fruit all day for a few days when recovering and still using intuitive eating but feels it would be a challenge when eating with others. Pt states she wants to get to the point of exercising at some point. Pt states she has started reading intuitive eating book.   Pt states she realizes her phone is a big distractor. Pt enjoys providing for others.      Mental health diagnosis: BED  MEDICATIONS: See list   DIETARY INTAKE:  Usual eating pattern includes 3 meals and 1 snacks per day.  Everyday foods include fast food.  Avoided foods include avoids bread, pasta, potatoes, and rice.    24-hr recall:  B (9:30am): sometimes skips; yogurt + prunes S: sausage and egg McMuffin L (2 pm): chicken noodle soup or chicken salad sandwich + popcorn  S (6:30 pm): cookies  D: steak + potatoes or chicken stir fry or   S: almonds + chocolate Beverages: water, coffee, unsweetened tea, soda  Usual physical activity: none stated  Preferred Learning Style:   No preference indicated    Progress Towards Goal(s):  In progress.   Nutritional Diagnosis:  NB-1.5 Disordered eating pattern As related to binge eating.  As evidenced by dietary recall.  Intervention:  Nutrition education and counseling. Pt was encouraged to keep up the great work with intuitive eating, challenging opposing voices, and continue to create freedom with food. Pt was encouraged to keep up the great work also with growing knowledge of intuitive eating in spare time.   Teaching Method Utilized:  Visual Auditory Hands on  Handouts given during visit include:  none  Demonstrated degree of understanding via:  Teach Back   Monitoring/Evaluation:  Dietary intake, exercise, and body weight in 1 week(s).

## 2018-08-02 ENCOUNTER — Ambulatory Visit
Admission: RE | Admit: 2018-08-02 | Discharge: 2018-08-02 | Disposition: A | Discharge status: Home / Self Care | Payer: Medicaid Other | Source: Ambulatory Visit | Attending: Neurology | Admitting: Neurology

## 2018-08-02 ENCOUNTER — Telehealth: Payer: Self-pay | Admitting: Neurology

## 2018-08-02 ENCOUNTER — Encounter: Payer: Medicaid Other | Admitting: Registered"

## 2018-08-02 ENCOUNTER — Other Ambulatory Visit: Payer: Self-pay

## 2018-08-02 DIAGNOSIS — I679 Cerebrovascular disease, unspecified: Secondary | ICD-10-CM

## 2018-08-02 DIAGNOSIS — E119 Type 2 diabetes mellitus without complications: Secondary | ICD-10-CM | POA: Diagnosis not present

## 2018-08-02 DIAGNOSIS — F509 Eating disorder, unspecified: Secondary | ICD-10-CM

## 2018-08-02 MED ORDER — IOPAMIDOL (ISOVUE-370) INJECTION 76%
75.0000 mL | Freq: Once | INTRAVENOUS | Status: AC | PRN
  Administered 2018-08-02: 75 mL via INTRAVENOUS

## 2018-08-02 NOTE — Telephone Encounter (Signed)
Call the patient.  The CT angiogram of the head neck is normal.  Nothing that would be causing pulsatile tinnitus or headache.   CTA head and neck 08/02/18:  IMPRESSION: No significant intracranial or extracranial stenosis or dissection.  No abnormal postcontrast enhancement.  No vascular abnormality is seen which might contribute to vertigo or pulsatile tinnitus.

## 2018-08-02 NOTE — Progress Notes (Signed)
Medical Nutrition Therapy:  Appt start time: 10:00 end time: 10:45  Assessment:  Primary concerns today:   Pt states she does not like to feel restricted because it can trigger binge eating. Pt reports feeling well and not being restricted with recent pandemic. Pt states it can be challenging for her to eat multiple times throughout the day. Pt was encouraged to have meals with grandchildren (ages 35, 7, and 11) visiting this week. Pt states she is still listening to intuitive eating podcasts.   Pt states she realizes her phone is a big distractor. Pt enjoys providing for others.      Mental health diagnosis: BED  MEDICATIONS: See list   DIETARY INTAKE:  Usual eating pattern includes 3 meals and 1 snacks per day.  Everyday foods include fast food.  Avoided foods include avoids bread, pasta, potatoes, and rice.    24-hr recall:  B (9:30am): sometimes skips; yogurt + prunes S: sausage and egg McMuffin L (2 pm): chicken noodle soup or chicken salad sandwich + popcorn  S (6:30 pm): cookies  D: steak + potatoes or chicken stir fry or   S: almonds + chocolate Beverages: water, coffee, unsweetened tea, soda  Usual physical activity: none stated  Preferred Learning Style:   No preference indicated    Progress Towards Goal(s):  In progress.   Nutritional Diagnosis:  NB-1.5 Disordered eating pattern As related to binge eating.  As evidenced by dietary recall.  Intervention:  Nutrition education and counseling. Pt was encouraged to keep up the great work with intuitive eating, listening to podcasts in spare time, trying to have 3 meals a day and ways to do that in the upcoming weeks. Goals: - Prepare to have a good night's rest.  - Aim to have meals with kids.   Teaching Method Utilized:  Visual Auditory Hands on  Handouts given during visit include:  none  Demonstrated degree of understanding via:  Teach Back   Monitoring/Evaluation:  Dietary intake, exercise, and body  weight in 1 week(s).

## 2018-08-02 NOTE — Patient Instructions (Addendum)
-   Prepare to have a good night's rest.   - Aim to have meals with kids.

## 2018-08-09 ENCOUNTER — Other Ambulatory Visit: Payer: Self-pay | Admitting: Nurse Practitioner

## 2018-08-09 ENCOUNTER — Other Ambulatory Visit (HOSPITAL_COMMUNITY)
Admission: RE | Admit: 2018-08-09 | Discharge: 2018-08-09 | Disposition: A | Discharge status: Home / Self Care | Payer: Medicaid Other | Source: Ambulatory Visit | Attending: Nurse Practitioner | Admitting: Nurse Practitioner

## 2018-08-09 ENCOUNTER — Other Ambulatory Visit: Payer: Self-pay

## 2018-08-09 ENCOUNTER — Encounter: Payer: Medicaid Other | Admitting: Registered"

## 2018-08-09 DIAGNOSIS — E119 Type 2 diabetes mellitus without complications: Secondary | ICD-10-CM | POA: Diagnosis not present

## 2018-08-09 DIAGNOSIS — Z01419 Encounter for gynecological examination (general) (routine) without abnormal findings: Secondary | ICD-10-CM | POA: Diagnosis present

## 2018-08-09 NOTE — Progress Notes (Signed)
Medical Nutrition Therapy:  Appt start time: 11:15 end time: 12:05  Assessment:  Primary concerns today:   Pt states she had grandchildren last week. Reports while grandchildren were visiting pt was able to have 3 meals/day, different for her. States it was easier to eat when they were there. States she notices when she feels uncomfortable now and states she has noticed increase in weight because she was weighed at recent gynecology visit. Pt states she also had A1c tested today.   Pt states she realizes her phone is a big distractor. Pt enjoys providing for others.      Mental health diagnosis: BED  MEDICATIONS: See list   DIETARY INTAKE:  Usual eating pattern includes 3 meals and 1 snacks per day.  Everyday foods include fast food.  Avoided foods include avoids bread, pasta, potatoes, and rice.    24-hr recall:  B (9:30am): sometimes skips; yogurt + prunes S: sausage and egg McMuffin L (2 pm): chicken noodle soup or chicken salad sandwich + popcorn  S (6:30 pm): cookies  D: steak + potatoes or chicken stir fry or   S: almonds + chocolate Beverages: water, coffee, unsweetened tea, soda  Usual physical activity: none stated  Preferred Learning Style:   No preference indicated    Progress Towards Goal(s):  In progress.   Nutritional Diagnosis:  NB-1.5 Disordered eating pattern As related to binge eating.  As evidenced by dietary recall.  Intervention:  Nutrition education and counseling. Discussed how body may feel when starting to fuel more often throughout the day. Pt was encouraged to keep up the great work with intuitive eating, listening to podcasts in spare time.   Teaching Method Utilized:  Visual Auditory Hands on  Handouts given during visit include:  none  Demonstrated degree of understanding via:  Teach Back   Monitoring/Evaluation:  Dietary intake, exercise, and body weight in 1 week(s).

## 2018-08-12 LAB — CYTOLOGY - PAP
DIAGNOSIS: NEGATIVE
HPV: NOT DETECTED

## 2018-08-16 ENCOUNTER — Ambulatory Visit: Payer: Self-pay | Admitting: Registered"

## 2018-08-23 ENCOUNTER — Other Ambulatory Visit: Payer: Self-pay

## 2018-08-23 ENCOUNTER — Encounter: Payer: Medicaid Other | Attending: Family Medicine | Admitting: Registered"

## 2018-08-23 DIAGNOSIS — E119 Type 2 diabetes mellitus without complications: Secondary | ICD-10-CM | POA: Insufficient documentation

## 2018-08-23 NOTE — Patient Instructions (Signed)
-   Contact info for: [facility]        [place], [place] [address]         [phone]  - Try to have meals throughout the day.

## 2018-08-23 NOTE — Progress Notes (Signed)
Medical Nutrition Therapy:  Appt start time: 2:02 end time: 2:51  Assessment:  Primary concerns today:   Pt states recent A1c was 7.4 (same as previous A1c). States provider wants her to lower it. Was discouraged from A1c appt and hearing A1c did not decrease along with the mention of changing eating habits. Pt reports metformin was increased.  Pt states she has not been checking blood sugars and confused about how all this plays into intuitive eating. Pt states it has been helpful watching little girl and having to regulate meals. Checked BS ealier today (220). Pt expressed curiosity about weight-inclusive/Health At Every Size/Intuitive Eating primary care providers in the [place] area.   Pt states she has been feeling out of control lately. Needs to set up virtual appt with therapist. Also reports sleep pattern has been disturbed.   Pt states she realizes her phone is a big distractor. Pt enjoys providing for others.      Mental health diagnosis: BED  MEDICATIONS: See list   DIETARY INTAKE:  Usual eating pattern includes 3 meals and 1 snacks per day.  Everyday foods include fast food.  Avoided foods include avoids bread, pasta, potatoes, and rice.    24-hr recall:  B (9:30am): [place] cracker 2 fig newtons + diet tea  S: sausage and egg McMuffin L (2 pm): chicken noodle soup or chicken salad sandwich + popcorn  S (6:30 pm): cookies  D: steak + potatoes or chicken stir fry or   S: almonds + chocolate Beverages: water, coffee, unsweetened tea, soda  Usual physical activity: none stated  Preferred Learning Style:   No preference indicated    Progress Towards Goal(s):  In progress.   Nutritional Diagnosis:  NB-1.5 Disordered eating pattern As related to binge eating.  As evidenced by dietary recall.  Intervention:  Nutrition education and counseling. Discussed A1c and blood sugar numbers. Discussed importance of eating throughout the day and having consistency. Pt was  encouraged to keep up the great work with intuitive eating. Discussed current times and how that may be affecting relationship with food and other things. Encouraged to schedule follow-up appt with therapist.  Goals: - Contact info for: [facility]        [place], [place] [address]         [phone] - Try to have meals throughout the day.   Teaching Method Utilized:  Visual Auditory Hands on  Handouts given during visit include:  none  Demonstrated degree of understanding via:  Teach Back   Monitoring/Evaluation:  Dietary intake, exercise, and body weight in 1 week(s).

## 2018-08-30 ENCOUNTER — Other Ambulatory Visit: Payer: Self-pay

## 2018-08-30 ENCOUNTER — Encounter: Payer: Medicaid Other | Admitting: Registered"

## 2018-08-30 DIAGNOSIS — F509 Eating disorder, unspecified: Secondary | ICD-10-CM

## 2018-08-30 DIAGNOSIS — E119 Type 2 diabetes mellitus without complications: Secondary | ICD-10-CM | POA: Diagnosis not present

## 2018-08-30 NOTE — Patient Instructions (Signed)
-   Contact Annie for follow-up appt.   - Check out [email or web address]  on Instagram for eating support.

## 2018-08-30 NOTE — Progress Notes (Signed)
Medical Nutrition Therapy:  Appt start time: 11:13 end time: 12:10  Assessment:  Primary concerns today:   Pt arrives stating she is feeling calmer from previous appt. Still adjusting and processing how our world is handling recent pandemic and what that means for her. Pt reports listening to podcast recently which triggered some thoughts of childhood memories related to food, weight watchers, being told what she could and could not eat, and food insecurity. Pt states she had to find her lunch money and have acces to lunch during high school years. Pt states she recognizes and resisting the desire to hoard food.   Pt states she realizes her phone is a big distractor. Pt enjoys providing for others.      Mental health diagnosis: BED  MEDICATIONS: See list   DIETARY INTAKE:  Usual eating pattern includes 3 meals and 1 snacks per day.  Everyday foods include fast food.  Avoided foods include avoids bread, pasta, potatoes, and rice.    24-hr recall:  B (9:30am): Belvita cracker 2 fig newtons + diet tea  S: sausage and egg McMuffin L (2 pm): chicken noodle soup or chicken salad sandwich + popcorn  S (6:30 pm): cookies  D: steak + potatoes or chicken stir fry or   S: almonds + chocolate Beverages: water, coffee, unsweetened tea, soda  Usual physical activity: none stated  Preferred Learning Style:   No preference indicated    Progress Towards Goal(s):  In progress.   Nutritional Diagnosis:  NB-1.5 Disordered eating pattern As related to binge eating.  As evidenced by dietary recall.  Intervention:  Nutrition education and counseling. Discussed current times and how that may be affecting relationship with food and other things. Encouraged to schedule follow-up appt with therapist.  Goals: - [place] for follow-up appt.  - Check out [email or web address]  on Instagram for eating support.   Teaching Method Utilized:  Visual Auditory Hands on  Handouts given during visit  include:  none  Demonstrated degree of understanding via:  Teach Back   Monitoring/Evaluation:  Dietary intake, exercise, and body weight in 1 week(s).

## 2018-09-06 ENCOUNTER — Encounter: Payer: Medicaid Other | Admitting: Registered"

## 2018-09-06 ENCOUNTER — Other Ambulatory Visit: Payer: Self-pay

## 2018-09-06 DIAGNOSIS — E119 Type 2 diabetes mellitus without complications: Secondary | ICD-10-CM | POA: Diagnosis not present

## 2018-09-06 NOTE — Progress Notes (Signed)
Medical Nutrition Therapy:  Appt start time: 11:08 end time: 12:10  Assessment:  Primary concerns today:   Pt states she recently bought a new fridge. Pt states she is  mindful to not aim to fill it up. Has had a new house mate to move in with her. States she contacted her therapist today to make an appt. Realizes with the many changes going on, she is needing follow-up with therapy. Realized she was a little depressed yesterday. Realizes she does not have much alone time because of her connection with others. Likes to have consistency with healthcare. Pt states she had chips and dip for the first time in years and did not feel any guilt with it.   Pt states she realizes her phone is a big distractor. Pt enjoys providing for others.      Mental health diagnosis: BED  MEDICATIONS: See list   DIETARY INTAKE:  Usual eating pattern includes 3 meals and 2 snacks per day.  Everyday foods include fast food.  Avoided foods include avoids bread, pasta, potatoes, and rice.    24-hr recall:  B (9:30am): [place] + swiss sandwich S: doughnut + juice L (2 pm): bologna and cheese sandwich S (6:30 pm): chips + dip D: chicken pot pie   Beverages: apple juice, water, coffee, unsweetened tea, soda  Usual physical activity: none stated  Preferred Learning Style:   No preference indicated    Progress Towards Goal(s):  In progress.   Nutritional Diagnosis:  NB-1.5 Disordered eating pattern As related to binge eating.  As evidenced by dietary recall.  Intervention:  Nutrition education and counseling. Encouraged to keep up the great work with intuitive eating and eating throughout the day. Encouraged to continue to honor body.   Teaching Method Utilized:  Visual Auditory Hands on  Handouts given during visit include:  none  Demonstrated degree of understanding via:  Teach Back   Monitoring/Evaluation:  Dietary intake, exercise, and body weight in 1 week(s).

## 2018-09-13 ENCOUNTER — Encounter: Payer: Medicaid Other | Admitting: Registered"

## 2018-09-13 ENCOUNTER — Other Ambulatory Visit: Payer: Self-pay

## 2018-09-13 DIAGNOSIS — E119 Type 2 diabetes mellitus without complications: Secondary | ICD-10-CM | POA: Diagnosis not present

## 2018-09-13 DIAGNOSIS — F509 Eating disorder, unspecified: Secondary | ICD-10-CM

## 2018-09-13 NOTE — Progress Notes (Signed)
Medical Nutrition Therapy:  Appt start time: 8:35 end time: 9:30  Assessment:  Primary concerns today:   Pt states she had a therapist appt last Thursday. Still getting used Telehealth visits. Pt states she made chicken alfredo with house mate last week. Pt states she went to [employer] and picked up fruits and vegetables. 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 Pt states she has been eating a variety of peppers with salads at night. Reports also eating throughout the day. Pt states she also has been eating more cereal lately. Likes whole milk. Listening to Intuitive eating professionals in spare time.   Pt states she realizes her phone is a big distractor. Pt enjoys providing for others.      Mental health diagnosis: BED  MEDICATIONS: See list   DIETARY INTAKE:  Usual eating pattern includes 3 meals and 2 snacks per day.  Everyday foods include fast food.  Avoided foods include avoids bread, pasta, potatoes, and rice.    24-hr recall:  B (9:30am): [place] + swiss sandwich S: doughnut + juice L (2 pm): ravioli or bologna and cheese sandwich S (6:30 pm): chips + dip D: chicken alfredo + salad or chicken pot pie  S: salad  Beverages: apple juice, water, coffee, unsweetened tea, soda  Usual physical activity: none stated  Preferred Learning Style:   No preference indicated    Progress Towards Goal(s):  In progress.   Nutritional Diagnosis:  NB-1.5 Disordered eating pattern As related to binge eating.  As evidenced by dietary recall.  Intervention:  Nutrition education and counseling. Encouraged to keep up the great work with intuitive eating and eating throughout the day. Encouraged to continue to honor body.   Teaching Method Utilized:  Visual Auditory Hands on  Handouts given during visit include:  none  Demonstrated degree of understanding via:  Teach Back   Monitoring/Evaluation:  Dietary intake, exercise, and body weight  in 1 week(s).

## 2018-09-20 ENCOUNTER — Ambulatory Visit: Payer: Self-pay | Admitting: Registered"

## 2018-09-27 ENCOUNTER — Ambulatory Visit: Payer: Self-pay | Admitting: Registered"

## 2018-10-04 ENCOUNTER — Encounter: Payer: Self-pay | Admitting: Registered"

## 2018-10-04 ENCOUNTER — Encounter: Payer: Medicaid Other | Attending: Family Medicine | Admitting: Registered"

## 2018-10-04 ENCOUNTER — Other Ambulatory Visit: Payer: Self-pay

## 2018-10-04 DIAGNOSIS — E119 Type 2 diabetes mellitus without complications: Secondary | ICD-10-CM | POA: Diagnosis present

## 2018-10-04 DIAGNOSIS — F509 Eating disorder, unspecified: Secondary | ICD-10-CM

## 2018-10-04 NOTE — Progress Notes (Signed)
Medical Nutrition Therapy:  Appt start time: 8:35 end time: 9:20  Assessment:  Primary concerns today:   Pt arrives stating she did not take vyvanse this week due to not being able to sleep at night. Noticed she is busy during the day and will forget to eat and/or drink. Sometimes feels dizzy, checked BS (110). Pt notices ED voice on days when she doesn't eat much telling her "yayyy, good for me". Pt reports recognizing this is not beneficial. Reports meeting with therapist weekly. 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 Reports keeping snacks in her purse when running errands. States she may be going to the beach for her birthday next [place]!  Pt states she realizes her phone is a big distractor. Pt enjoys providing for others.      Mental health diagnosis: BED  MEDICATIONS: See list   DIETARY INTAKE:  Usual eating pattern includes 3 meals and 2 snacks per day.  Everyday foods include fast food.  Avoided foods include avoids bread, pasta, potatoes, and rice.    24-hr recall:  B (9:30am): [place] + swiss sandwich S: doughnut + juice L (2 pm): ravioli or bologna and cheese sandwich S (6:30 pm): chips + dip D: chicken alfredo + salad or chicken pot pie  S: salad  Beverages: apple juice, water, coffee, unsweetened tea, soda  Usual physical activity: none stated  Preferred Learning Style:   No preference indicated    Progress Towards Goal(s):  In progress.   Nutritional Diagnosis:  NB-1.5 Disordered eating pattern As related to binge eating.  As evidenced by dietary recall.  Intervention:  Nutrition education and counseling. Discussed challenging ED voice and reminded pt about why we don't want to skip meals throughout the day. Discussed hydration and ways to include it in "busy days". Encouraged to keep up the great work being intentional about having snacks on-hand and being self-aware. Goals: - Challenge ED thoughts when they arise.   Teaching Method Utilized:   Visual Auditory Hands on  Handouts given during visit include:  none  Demonstrated degree of understanding via:  Teach Back   Monitoring/Evaluation:  Dietary intake, exercise, and body weight in 2 week(s).

## 2018-10-04 NOTE — Patient Instructions (Addendum)
-   Challenge ED thoughts when they arise.

## 2018-10-18 ENCOUNTER — Ambulatory Visit: Payer: Medicaid Other | Admitting: Registered"

## 2018-10-25 ENCOUNTER — Encounter: Payer: Self-pay | Admitting: Registered"

## 2018-10-25 ENCOUNTER — Encounter: Payer: Medicaid Other | Attending: Family Medicine | Admitting: Registered"

## 2018-10-25 ENCOUNTER — Other Ambulatory Visit: Payer: Self-pay

## 2018-10-25 DIAGNOSIS — F509 Eating disorder, unspecified: Secondary | ICD-10-CM

## 2018-10-25 DIAGNOSIS — E119 Type 2 diabetes mellitus without complications: Secondary | ICD-10-CM | POA: Diagnosis present

## 2018-10-25 NOTE — Progress Notes (Signed)
Medical Nutrition Therapy:  Appt start time: 2:25 end time: 3:10  Assessment:  Primary concerns today:   Pt arrives stating she has been trying intuitive eating with grandchildren. Notices that helping them is helping her. Has been keeping snacks on hand for grandchildren as well. Wants to do that same for herself. Pt states she has not been sleeping well.   Pt states she realizes her phone is a big distractor. Pt enjoys providing for others.      Mental health diagnosis: BED  MEDICATIONS: See list   DIETARY INTAKE:  Usual eating pattern includes 3 meals and 2 snacks per day.  Everyday foods include fast food.  Avoided foods include avoids bread, pasta, potatoes, and rice.    24-hr recall:  B (9:30am): pop tarts + granola bar  L (2 pm): egg + cheese + pistachios D: chicken alfredo + salad or chicken pot pie  S: salad  Beverages: apple juice, water, coffee, unsweetened tea, soda  Usual physical activity: none stated  Preferred Learning Style:   No preference indicated    Progress Towards Goal(s):  In progress.   Nutritional Diagnosis:  NB-1.5 Disordered eating pattern As related to binge eating.  As evidenced by dietary recall.  Intervention:  Nutrition education and counseling. Encouraged to keep up the great work being intentional about having snacks on-hand and being self-aware.  Teaching Method Utilized:  Visual Auditory Hands on  Handouts given during visit include:  none  Demonstrated degree of understanding via:  Teach Back   Monitoring/Evaluation:  Dietary intake, exercise, and body weight in 1 week(s).

## 2018-11-01 ENCOUNTER — Encounter: Payer: Self-pay | Admitting: Registered"

## 2018-11-01 ENCOUNTER — Encounter: Payer: Medicaid Other | Admitting: Registered"

## 2018-11-01 ENCOUNTER — Other Ambulatory Visit: Payer: Self-pay

## 2018-11-01 DIAGNOSIS — E119 Type 2 diabetes mellitus without complications: Secondary | ICD-10-CM | POA: Diagnosis not present

## 2018-11-01 DIAGNOSIS — F509 Eating disorder, unspecified: Secondary | ICD-10-CM

## 2018-11-01 NOTE — Progress Notes (Signed)
Medical Nutrition Therapy:  Appt start time: 2:10 end time: 3:00  Assessment:  Primary concerns today:   Pt arrives stating she started planting vegetables and growing a garden at home. Reports drinking at least 64 ounces of water a day. Pt states she has been spending time challenging social justice issues lately. Reports preparing her room to be able to sleep in there more often to achieve sleeping better during the night.   Previous appts: Pt states she has been trying intuitive eating with grandchildren. Notices that helping them is helping her. Has been keeping snacks on hand for grandchildren as well. Wants to do that same for herself. Pt states she has not been sleeping well.   Pt states she realizes her phone is a big distractor. Pt enjoys providing for others.      Mental health diagnosis: BED  MEDICATIONS: See list   DIETARY INTAKE:  Usual eating pattern includes 3 meals and 2 snacks per day.  Everyday foods include fast food.  Avoided foods include avoids bread, pasta, potatoes, and rice.    24-hr recall:  B (9:30am): pop tarts + granola bar  L (2 pm): egg + cheese + pistachios D: chicken alfredo + salad or chicken pot pie  S: salad  Beverages: apple juice, water, coffee, unsweetened tea, soda  Usual physical activity: none stated  Preferred Learning Style:   No preference indicated    Progress Towards Goal(s):  In progress.   Nutritional Diagnosis:  NB-1.5 Disordered eating pattern As related to binge eating.  As evidenced by dietary recall.  Intervention:  Nutrition education and counseling. Discussed intuitive eating, eating to nourish her body, taking the time to eat even when days are busy, and sleep hygiene.   Teaching Method Utilized:  Visual Auditory Hands on  Handouts given during visit include:  none  Demonstrated degree of understanding via:  Teach Back   Monitoring/Evaluation:  Dietary intake, exercise, and body weight in 1 week(s).

## 2018-11-15 ENCOUNTER — Ambulatory Visit: Payer: Medicaid Other | Admitting: Registered"

## 2018-11-22 ENCOUNTER — Ambulatory Visit: Payer: Medicaid Other | Admitting: Registered"

## 2018-12-06 ENCOUNTER — Other Ambulatory Visit: Payer: Self-pay | Admitting: Family Medicine

## 2018-12-06 ENCOUNTER — Encounter: Payer: Medicaid Other | Attending: Family Medicine | Admitting: Registered"

## 2018-12-06 ENCOUNTER — Encounter: Payer: Self-pay | Admitting: Registered"

## 2018-12-06 ENCOUNTER — Other Ambulatory Visit: Payer: Self-pay

## 2018-12-06 DIAGNOSIS — E119 Type 2 diabetes mellitus without complications: Secondary | ICD-10-CM | POA: Insufficient documentation

## 2018-12-06 DIAGNOSIS — F509 Eating disorder, unspecified: Secondary | ICD-10-CM

## 2018-12-06 DIAGNOSIS — Z1231 Encounter for screening mammogram for malignant neoplasm of breast: Secondary | ICD-10-CM

## 2018-12-06 NOTE — Patient Instructions (Signed)
-   Look into contacting an endocrinologist.   - Aim to increase physical activity to at least 10-15 minutes/day, 3 days/week.

## 2018-12-06 NOTE — Progress Notes (Signed)
Medical Nutrition Therapy:  Appt start time: 10:38 end time: 11:40  Assessment:  Primary concerns today:   Pt arrives stating she recently gained 12 pounds. States recently increased BS numbers: FBS 206, fasting usually 160-170. Reports increased numbers are making her anxious and increasing stress. Reports recent A1c 8.3. States she cannot get fasting <200  with increased metformin. Was given prescription to take 4 metformins/day but did not do that. Also reports increased blood pressure. BP readings are: 133/76, 163/78, 141/78, 164/80, 166/77, 157/79, 154/78, 170/80, 188/86. States tomorrow is first "quit day" of smoking and reports she typically has health issues yearly on her birthday.   Reports she has been doing little projects at home. Working on at [employer] and cleaning. Denies physical activity at this time. Reports still intuitive eating and creating balance with carbohydrates.   Was in [place] with family. Cooked nightly for family. Ate with them and enjoyed herself.   Previous appts: Pt states she started planting vegetables and growing a garden at home. Reports drinking at least 64 ounces of water a day. Reports preparing her room to be able to sleep in there more often to achieve sleeping better during the night. Pt states she has been trying intuitive eating with grandchildren. Notices that helping them is helping her. Has been keeping snacks on hand for grandchildren as well. Wants to do that same for herself. Pt states she has not been sleeping well.   Pt states she realizes her phone is a big distractor. Pt enjoys providing for others.      Mental health diagnosis: BED  MEDICATIONS: See list   DIETARY INTAKE:  Usual eating pattern includes 3 meals and 2 snacks per day.  Everyday foods include fast food.  Avoided foods include avoids bread, pasta, potatoes, and rice.    24-hr recall:  B (9:30am): pop tarts + granola bar  L (2 pm): egg + cheese + pistachios D: tuna +  macaroni and cheese  S: salad  Beverages: apple juice, water, coffee, unsweetened tea, soda  Usual physical activity: none stated  Preferred Learning Style:   No preference indicated    Progress Towards Goal(s):  In progress.   Nutritional Diagnosis:  NB-1.5 Disordered eating pattern As related to binge eating.  As evidenced by dietary recall.  Intervention:  Nutrition education and counseling. Discussed diabetes progression, importance of taking medications as prescribed, and the importance of physical activity.  Goals: - Look into contacting an endocrinologist.  - Aim to increase physical activity to at least 10-15 minutes/day, 3 days/week.   Teaching Method Utilized:  Visual Auditory Hands on  Handouts given during visit include:  none  Demonstrated degree of understanding via:  Teach Back   Monitoring/Evaluation:  Dietary intake, exercise, and body weight in 2 week(s).

## 2018-12-20 ENCOUNTER — Ambulatory Visit: Payer: Medicaid Other | Admitting: Registered"

## 2018-12-22 ENCOUNTER — Encounter: Payer: Medicaid Other | Attending: Family Medicine | Admitting: Registered"

## 2018-12-22 ENCOUNTER — Other Ambulatory Visit: Payer: Self-pay

## 2018-12-22 ENCOUNTER — Encounter: Payer: Self-pay | Admitting: Registered"

## 2018-12-22 DIAGNOSIS — E119 Type 2 diabetes mellitus without complications: Secondary | ICD-10-CM | POA: Insufficient documentation

## 2018-12-22 DIAGNOSIS — F509 Eating disorder, unspecified: Secondary | ICD-10-CM

## 2018-12-22 NOTE — Progress Notes (Signed)
Medical Nutrition Therapy:  Appt start time: 4:38 end time: 5:30  Assessment:  Primary concerns today:   Pt states she needs structure and a meal plan to help with preparing meals and having them ready. Pt states she is concerned about eating for health and combining that with intuitive eating.   Previous appt: Pt arrives stating she recently gained 12 pounds. States recently increased BS numbers: FBS 206, fasting usually 160-170. Reports increased numbers are making her anxious and increasing stress. Reports recent A1c 8.3. States she cannot get fasting <200  with increased metformin. Was given prescription to take 4 metformins/day but did not do that. Also reports increased blood pressure. BP readings are: 133/76, 163/78, 141/78, 164/80, 166/77, 157/79, 154/78, 170/80, 188/86. States tomorrow is first "quit day" of smoking and reports she typically has health issues yearly on her birthday.   Denies physical activity at this time. Reports still intuitive eating and creating balance with carbohydrates.   Pt states she realizes her phone is a big distractor. Pt enjoys providing for others.      Mental health diagnosis: BED  MEDICATIONS: See list   DIETARY INTAKE:  Usual eating pattern includes 3 meals and 2 snacks per day.  Everyday foods include fast food.  Avoided foods include avoids bread, pasta, potatoes, and rice.    24-hr recall:  B (9:30am):  pop tarts + granola bar  L (2 pm): egg + cheese + pistachios D: tuna + macaroni and cheese  S: salad  Beverages: apple juice, water, coffee, unsweetened tea, soda  Usual physical activity: none stated  Preferred Learning Style:   No preference indicated    Progress Towards Goal(s):  In progress.   Nutritional Diagnosis:  NB-1.5 Disordered eating pattern As related to binge eating.  As evidenced by dietary recall.  Intervention:  Nutrition education and counseling. Discussed meal planning and ways to create a variety of meals  throughout the week.   Teaching Method Utilized:  Visual Auditory Hands on  Handouts given during visit include:  Meal Planning  Demonstrated degree of understanding via:  Teach Back   Monitoring/Evaluation:  Dietary intake, exercise, and body weight in 2 week(s).

## 2018-12-27 ENCOUNTER — Ambulatory Visit: Payer: Medicaid Other | Admitting: Registered"

## 2019-01-10 ENCOUNTER — Encounter: Payer: Self-pay | Admitting: Registered"

## 2019-01-10 ENCOUNTER — Encounter: Payer: Medicaid Other | Admitting: Registered"

## 2019-01-10 ENCOUNTER — Other Ambulatory Visit: Payer: Self-pay

## 2019-01-10 DIAGNOSIS — E119 Type 2 diabetes mellitus without complications: Secondary | ICD-10-CM | POA: Diagnosis not present

## 2019-01-10 NOTE — Patient Instructions (Signed)
-   Aim to have non-starchy vegetables with lunch and dinner.

## 2019-01-10 NOTE — Progress Notes (Signed)
Medical Nutrition Therapy:  Appt start time: 10:00 end time: 10:45  Assessment:  Primary concerns today:   Pt arrives stating her BS numbers are continuing to increase. Recent FBS was 234. States she has not been able to implement previously discussed information. States she has a lot going on. Able to eat broccoli for dinner one night. States she stopped at fast food and had quarter pounder with cheese and it filled her up. Unsure how to include vegetables with fast food. States she feels like she is not taking care of herself because she is having bladder concerns, recent concern with bartholin cyst, and increased BS numbers.   Discusses stress-related events-son lost job and recent family member with [place] scare. States she slept a lot yesterday, didn't each much, didn't take medications because she didn't eat. Reports working on prioritizing with therapist.   Previous appt: Pt arrives stating she recently gained 12 pounds. States recently increased BS numbers: FBS 206, fasting usually 160-170. Reports increased numbers are making her anxious and increasing stress. Reports recent A1c 8.3. States she cannot get fasting <200  with increased metformin. Was given prescription to take 4 metformins/day but did not do that. Also reports increased blood pressure. BP readings are: 133/76, 163/78, 141/78, 164/80, 166/77, 157/79, 154/78, 170/80, 188/86. States tomorrow is first "quit day" of smoking and reports she typically has health issues yearly on her birthday. Pt states she is concerned about eating for health and combining that with intuitive eating.States she needs structure and a meal plan.   Denies physical activity at this time. Reports still intuitive eating and creating balance with carbohydrates.   Pt states she realizes her phone is a big distractor. Pt enjoys providing for others.      Mental health diagnosis: BED  MEDICATIONS: See list   DIETARY INTAKE:  Usual eating pattern includes 3  meals and 2 snacks per day.  Everyday foods include fast food.  Avoided foods include avoids bread, pasta, potatoes, and rice.    24-hr recall:  B (9:30am): tea + peanut butter breakfast bar or fast food-sausage Mcmuffin with egg L (4 pm): hot dog + sausage dog D: cheese roll up + peanut butter cups  Beverages: water, tea, apple juice, water, coffee, unsweetened tea, soda  Usual physical activity: none stated  Preferred Learning Style:   No preference indicated    Progress Towards Goal(s):  In progress.   Nutritional Diagnosis:  NB-1.5 Disordered eating pattern As related to binge eating.  As evidenced by dietary recall.  Intervention:  Nutrition education and counseling. Discussed meal planning and ways to include vegetables when eating fast food. Dicussed the importance of having vegetables with each meal along with carbohydrates and protein.  Goals: - Aim to have non-starchy vegetables with lunch and dinner.   Teaching Method Utilized:  Visual Auditory Hands on  Handouts given during visit include:  none  Demonstrated degree of understanding via:  Teach Back   Monitoring/Evaluation:  Dietary intake, exercise, and body weight in 1 week(s).

## 2019-01-18 ENCOUNTER — Ambulatory Visit: Payer: Medicaid Other | Admitting: Registered"

## 2019-01-19 ENCOUNTER — Other Ambulatory Visit: Payer: Self-pay

## 2019-01-19 ENCOUNTER — Ambulatory Visit
Admission: RE | Admit: 2019-01-19 | Discharge: 2019-01-19 | Disposition: A | Discharge status: Home / Self Care | Payer: Medicaid Other | Source: Ambulatory Visit | Attending: Family Medicine | Admitting: Family Medicine

## 2019-01-19 DIAGNOSIS — Z1231 Encounter for screening mammogram for malignant neoplasm of breast: Secondary | ICD-10-CM

## 2019-01-24 ENCOUNTER — Other Ambulatory Visit: Payer: Self-pay | Admitting: Family Medicine

## 2019-01-24 ENCOUNTER — Encounter: Payer: Medicaid Other | Attending: Family Medicine | Admitting: Registered"

## 2019-01-24 ENCOUNTER — Other Ambulatory Visit: Payer: Self-pay

## 2019-01-24 DIAGNOSIS — F509 Eating disorder, unspecified: Secondary | ICD-10-CM

## 2019-01-24 DIAGNOSIS — E119 Type 2 diabetes mellitus without complications: Secondary | ICD-10-CM | POA: Insufficient documentation

## 2019-01-24 DIAGNOSIS — R928 Other abnormal and inconclusive findings on diagnostic imaging of breast: Secondary | ICD-10-CM

## 2019-01-24 NOTE — Progress Notes (Signed)
Medical Nutrition Therapy:  Appt start time: 2:07 end time: 3:00  Assessment:  Primary concerns today:   Pt states she is being more intentional about eating vegetables with meals, especially when younger children are visiting at her home. States she has been having snacks of yogurt or vegetables. Notices she has been waking up at night to eat and smoke a cigarette. Pt believes its anxiety-related. States she has not been checking BS due to recent high anxiety.   Previous appt: Reports recent A1c 8.3. States she cannot get fasting <200  with increased metformin. Pt states she is concerned about eating for health and combining that with intuitive eating.States she needs structure and a meal plan.   Denies physical activity at this time. Pt states she realizes her phone is a big distractor. Pt enjoys providing for others.      Mental health diagnosis: BED  MEDICATIONS: See list   DIETARY INTAKE:  Usual eating pattern includes 3 meals and 2 snacks per day.  Everyday foods include fast food.  Avoided foods include avoids bread, pasta, potatoes, and rice.    24-hr recall:  B: bagel + cream cheese  S: banana + greek yogurt L: Fried rice + shrimp + pork  S: Fruit cup D: Cheeseburger + fries or ravioli + 1/2 plate salad  Beverages: water, tea, apple juice, water, coffee, unsweetened tea, soda  Usual physical activity: none stated  Preferred Learning Style:   No preference indicated    Progress Towards Goal(s):  In progress.   Nutritional Diagnosis:  NB-1.5 Disordered eating pattern As related to binge eating.  As evidenced by dietary recall.  Intervention:  Nutrition education and counseling. Discussed balancing meals with vegetables and talking with therapist about stress management/self-care. Encouraged pt with changes already made. Discussed importance of adequately nourishing the body during the day.     Teaching Method Utilized:  Visual Auditory Hands on  Handouts given  during visit include:  none  Demonstrated degree of understanding via:  Teach Back   Monitoring/Evaluation:  Dietary intake, exercise, and body weight in 3 week(s).

## 2019-01-27 ENCOUNTER — Other Ambulatory Visit: Payer: Self-pay

## 2019-01-27 ENCOUNTER — Ambulatory Visit
Admission: RE | Admit: 2019-01-27 | Discharge: 2019-01-27 | Disposition: A | Discharge status: Home / Self Care | Payer: Medicaid Other | Source: Ambulatory Visit | Attending: Family Medicine | Admitting: Family Medicine

## 2019-01-27 ENCOUNTER — Other Ambulatory Visit: Payer: Self-pay | Admitting: Family Medicine

## 2019-01-27 DIAGNOSIS — R928 Other abnormal and inconclusive findings on diagnostic imaging of breast: Secondary | ICD-10-CM

## 2019-01-27 DIAGNOSIS — N6320 Unspecified lump in the left breast, unspecified quadrant: Secondary | ICD-10-CM

## 2019-02-13 ENCOUNTER — Other Ambulatory Visit: Payer: Self-pay | Admitting: Family Medicine

## 2019-02-13 ENCOUNTER — Ambulatory Visit
Admission: RE | Admit: 2019-02-13 | Discharge: 2019-02-13 | Disposition: A | Discharge status: Home / Self Care | Payer: Medicaid Other | Source: Ambulatory Visit | Attending: Family Medicine | Admitting: Family Medicine

## 2019-02-13 ENCOUNTER — Other Ambulatory Visit (HOSPITAL_COMMUNITY): Payer: Self-pay | Admitting: Diagnostic Radiology

## 2019-02-13 ENCOUNTER — Other Ambulatory Visit: Payer: Self-pay

## 2019-02-13 DIAGNOSIS — N6320 Unspecified lump in the left breast, unspecified quadrant: Secondary | ICD-10-CM

## 2019-02-16 ENCOUNTER — Encounter: Payer: Medicaid Other | Attending: Family Medicine | Admitting: Registered"

## 2019-02-16 ENCOUNTER — Encounter: Payer: Self-pay | Admitting: Registered"

## 2019-02-16 DIAGNOSIS — E119 Type 2 diabetes mellitus without complications: Secondary | ICD-10-CM | POA: Insufficient documentation

## 2019-02-16 NOTE — Progress Notes (Signed)
Medical Nutrition Therapy:  Appt start time: 11:15 end time: 11:59  Assessment:  Primary concerns today:   Pt states she has almost quit smoking. Reports recent trip to [facility] was good for her. States she ate better having 3 meals a day with host family. States since returning she is having 3 meals a day with grandchildren. States she has not been checking BS. Reports she will have labs drawn soon for A1c and thyroid. Reports thinking about taking a walk since weather is becoming cooler.   Previous appt: Pt states she is being more intentional about eating vegetables with meals, especially when younger children are visiting at her home. States she has been having snacks of yogurt or vegetables. Notices she has been waking up at night to eat and smoke a cigarette. Pt believes its anxiety-related. States she has not been checking BS due to recent high anxiety. Reports recent A1c 8.3. States she cannot get fasting <200  with increased metformin. Pt states she is concerned about eating for health and combining that with intuitive eating.States she needs structure and a meal plan.   Denies physical activity at this time. Pt states she realizes her phone is a big distractor. Pt enjoys providing for others.      Mental health diagnosis: BED  MEDICATIONS: See list   DIETARY INTAKE:  Usual eating pattern includes 3 meals and 2 snacks per day.  Everyday foods include fast food.  Avoided foods include avoids bread, pasta, potatoes, and rice.    24-hr recall:  B: banana + muffin S:  L: 2 chicken legs + roll  S: muffin D: salad  S: candy + roast beef sandwich  Beverages: water, tea, apple juice, water, coffee, unsweetened tea, soda  Usual physical activity: none stated  Preferred Learning Style:   No preference indicated    Progress Towards Goal(s):  In progress.   Nutritional Diagnosis:  NB-1.5 Disordered eating pattern As related to binge eating.  As evidenced by dietary  recall.  Intervention:  Nutrition education and counseling. Encouraged pt with changes already made related to having 3 meals a day and reducing smoking.   Teaching Method Utilized:  Visual Auditory Hands on  Handouts given during visit include:  none  Demonstrated degree of understanding via:  Teach Back   Monitoring/Evaluation:  Dietary intake, exercise, and body weight in 2 week(s).

## 2019-02-28 ENCOUNTER — Ambulatory Visit: Payer: Medicaid Other | Admitting: Registered"

## 2019-03-21 ENCOUNTER — Ambulatory Visit: Payer: Self-pay | Admitting: Surgery

## 2019-03-21 DIAGNOSIS — D242 Benign neoplasm of left breast: Secondary | ICD-10-CM

## 2019-03-22 ENCOUNTER — Other Ambulatory Visit: Payer: Self-pay | Admitting: Surgery

## 2019-03-22 DIAGNOSIS — D242 Benign neoplasm of left breast: Secondary | ICD-10-CM

## 2019-03-24 ENCOUNTER — Other Ambulatory Visit: Payer: Self-pay

## 2019-03-24 ENCOUNTER — Encounter (HOSPITAL_BASED_OUTPATIENT_CLINIC_OR_DEPARTMENT_OTHER): Payer: Self-pay | Admitting: *Deleted

## 2019-03-28 ENCOUNTER — Other Ambulatory Visit: Payer: Self-pay

## 2019-03-28 ENCOUNTER — Encounter (HOSPITAL_BASED_OUTPATIENT_CLINIC_OR_DEPARTMENT_OTHER)
Admission: RE | Admit: 2019-03-28 | Discharge: 2019-03-28 | Disposition: A | Discharge status: Home / Self Care | Payer: Medicaid Other | Source: Ambulatory Visit | Attending: Surgery | Admitting: Surgery

## 2019-03-28 ENCOUNTER — Other Ambulatory Visit (HOSPITAL_COMMUNITY)
Admission: RE | Admit: 2019-03-28 | Discharge: 2019-03-28 | Disposition: A | Discharge status: Home / Self Care | Payer: Medicaid Other | Source: Ambulatory Visit | Attending: Surgery | Admitting: Surgery

## 2019-03-28 DIAGNOSIS — Z20828 Contact with and (suspected) exposure to other viral communicable diseases: Secondary | ICD-10-CM | POA: Diagnosis not present

## 2019-03-28 DIAGNOSIS — E119 Type 2 diabetes mellitus without complications: Secondary | ICD-10-CM | POA: Insufficient documentation

## 2019-03-28 DIAGNOSIS — Z01812 Encounter for preprocedural laboratory examination: Secondary | ICD-10-CM | POA: Insufficient documentation

## 2019-03-28 DIAGNOSIS — Z01810 Encounter for preprocedural cardiovascular examination: Secondary | ICD-10-CM | POA: Insufficient documentation

## 2019-03-28 LAB — SARS CORONAVIRUS 2 (TAT 6-24 HRS): SARS Coronavirus 2: NEGATIVE

## 2019-03-28 NOTE — Progress Notes (Signed)
Anesthesia consult per Dr. Christella Hartigan, will proceed with surgery as scheduled.

## 2019-03-28 NOTE — Progress Notes (Signed)

## 2019-03-29 ENCOUNTER — Ambulatory Visit: Payer: Self-pay | Admitting: Surgery

## 2019-03-30 ENCOUNTER — Ambulatory Visit
Admission: RE | Admit: 2019-03-30 | Discharge: 2019-03-30 | Disposition: A | Discharge status: Home / Self Care | Payer: Medicaid Other | Source: Ambulatory Visit | Attending: Surgery | Admitting: Surgery

## 2019-03-30 ENCOUNTER — Other Ambulatory Visit: Payer: Self-pay

## 2019-03-30 DIAGNOSIS — D242 Benign neoplasm of left breast: Secondary | ICD-10-CM

## 2019-03-30 NOTE — H&P (Signed)
Alice Simpson Documented: 03/01/2019 9:48 AM Location: [place] Office Patient #: [id number] DOB: [birth year] Single / Language: Cleophus Molt / Race: White Female   History of Present Illness Rodman Key B. Hassell Done MD; 03/01/2019 10:29 AM) The patient is a 53 year old female who presents with a complaint of Breast problems. 53 year old with two intraductal papillomas recently biopsied in her left breast at the 1:30 and 2 oclock positions. Markers placed. I explained seed localized left breast biopsy to her.   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 She lost insurance coverage and hence her bariatric followup has not been optimal.   Will schedule seed localized left breast biopsy.    Allergies Malachi Bonds, CMA; 03/01/2019 9:56 AM) Penicillin G Benzathine & Proc *PENICILLINS*  Compazine *ANTIPSYCHOTICS/ANTIMANIC AGENTS*  Prochlorperazine *ANTIPSYCHOTICS/ANTIMANIC AGENTS*   Medication History (Chemira Jones, CMA; 03/01/2019 9:58 AM) busPIRone HCl (10MG  Tablet, Oral) Active. DULoxetine HCl (60MG  Capsule DR Part, Oral) Active. Vitamin D (Ergocalciferol) (1.25 MG(50000 UT) Capsule, Oral) Active. Fluticasone Propionate (50MCG/ACT Suspension, Nasal) Active. glipiZIDE ER (5MG  Tablet ER 24HR, Oral) Active. Heather (0.35MG  Tablet, Oral) Active. Jardiance (10MG  Tablet, Oral) Active. Losartan Potassium (50MG  Tablet, Oral) Active. Rosuvastatin Calcium (20MG  Tablet, Oral) Active. MetFORMIN HCl ER (500MG  Tablet ER 24HR, Oral daily) Active. Medications Reconciled  Vitals (Chemira Jones CMA; 03/01/2019 9:56 AM) 03/01/2019 9:56 AM Weight: 321 lb Height: 68in Body Surface Area: 2.5 m Body Mass Index: 48.81 kg/m  Pulse: 91 (Regular)  BP: 126/82 (Sitting, Left Arm, Standard)       Physical Exam (Ausha Sieh B. Hassell Done MD; 03/01/2019 10:30 AM) Breast     Assessment & Plan Rodman Key B. Hassell Done MD; 03/01/2019 10:32 AM) Alice Simpson  PAPILLOMA OF BREAST, LEFT (D24.2) Impression: Two areas in the left breast in the upper outer quadrant. Will need seed localization. I drew her pictures which she took with her.  Signed by Pedro Earls, MD (03/01/2019 10:33 AM)

## 2019-03-31 ENCOUNTER — Encounter (HOSPITAL_BASED_OUTPATIENT_CLINIC_OR_DEPARTMENT_OTHER): Payer: Self-pay

## 2019-03-31 ENCOUNTER — Ambulatory Visit (HOSPITAL_BASED_OUTPATIENT_CLINIC_OR_DEPARTMENT_OTHER): Payer: Medicaid Other | Admitting: Anesthesiology

## 2019-03-31 ENCOUNTER — Other Ambulatory Visit: Payer: Self-pay

## 2019-03-31 ENCOUNTER — Encounter (HOSPITAL_BASED_OUTPATIENT_CLINIC_OR_DEPARTMENT_OTHER)
Admission: RE | Disposition: A | Discharge status: Home / Self Care | Payer: Self-pay | Source: Home / Self Care | Attending: Surgery

## 2019-03-31 ENCOUNTER — Ambulatory Visit
Admission: RE | Admit: 2019-03-31 | Discharge: 2019-03-31 | Disposition: A | Discharge status: Home / Self Care | Payer: Medicaid Other | Source: Ambulatory Visit | Attending: Surgery | Admitting: Surgery

## 2019-03-31 ENCOUNTER — Ambulatory Visit (HOSPITAL_BASED_OUTPATIENT_CLINIC_OR_DEPARTMENT_OTHER)
Admission: RE | Admit: 2019-03-31 | Discharge: 2019-03-31 | Disposition: A | Discharge status: Home / Self Care | Payer: Medicaid Other | Attending: Surgery | Admitting: Surgery

## 2019-03-31 DIAGNOSIS — K219 Gastro-esophageal reflux disease without esophagitis: Secondary | ICD-10-CM | POA: Insufficient documentation

## 2019-03-31 DIAGNOSIS — Z79899 Other long term (current) drug therapy: Secondary | ICD-10-CM | POA: Insufficient documentation

## 2019-03-31 DIAGNOSIS — D242 Benign neoplasm of left breast: Secondary | ICD-10-CM

## 2019-03-31 DIAGNOSIS — I10 Essential (primary) hypertension: Secondary | ICD-10-CM | POA: Diagnosis not present

## 2019-03-31 DIAGNOSIS — F329 Major depressive disorder, single episode, unspecified: Secondary | ICD-10-CM | POA: Insufficient documentation

## 2019-03-31 DIAGNOSIS — E118 Type 2 diabetes mellitus with unspecified complications: Secondary | ICD-10-CM | POA: Diagnosis not present

## 2019-03-31 DIAGNOSIS — Z96641 Presence of right artificial hip joint: Secondary | ICD-10-CM | POA: Insufficient documentation

## 2019-03-31 DIAGNOSIS — F419 Anxiety disorder, unspecified: Secondary | ICD-10-CM | POA: Diagnosis not present

## 2019-03-31 DIAGNOSIS — Z6841 Body Mass Index (BMI) 40.0 and over, adult: Secondary | ICD-10-CM | POA: Diagnosis not present

## 2019-03-31 DIAGNOSIS — Z880 Allergy status to penicillin: Secondary | ICD-10-CM | POA: Insufficient documentation

## 2019-03-31 DIAGNOSIS — H93A9 Pulsatile tinnitus, unspecified ear: Secondary | ICD-10-CM | POA: Diagnosis not present

## 2019-03-31 DIAGNOSIS — F988 Other specified behavioral and emotional disorders with onset usually occurring in childhood and adolescence: Secondary | ICD-10-CM | POA: Diagnosis not present

## 2019-03-31 DIAGNOSIS — Z9884 Bariatric surgery status: Secondary | ICD-10-CM | POA: Insufficient documentation

## 2019-03-31 DIAGNOSIS — R42 Dizziness and giddiness: Secondary | ICD-10-CM | POA: Insufficient documentation

## 2019-03-31 DIAGNOSIS — M1990 Unspecified osteoarthritis, unspecified site: Secondary | ICD-10-CM | POA: Diagnosis not present

## 2019-03-31 DIAGNOSIS — R519 Headache, unspecified: Secondary | ICD-10-CM | POA: Insufficient documentation

## 2019-03-31 DIAGNOSIS — Z888 Allergy status to other drugs, medicaments and biological substances status: Secondary | ICD-10-CM | POA: Insufficient documentation

## 2019-03-31 DIAGNOSIS — Z7984 Long term (current) use of oral hypoglycemic drugs: Secondary | ICD-10-CM | POA: Insufficient documentation

## 2019-03-31 HISTORY — PX: RADIOACTIVE SEED GUIDED EXCISIONAL BREAST BIOPSY: SHX6490

## 2019-03-31 LAB — GLUCOSE, CAPILLARY
Glucose-Capillary: 208 mg/dL — ABNORMAL HIGH (ref 70–99)
Glucose-Capillary: 176 mg/dL — ABNORMAL HIGH (ref 70–99)

## 2019-03-31 SURGERY — RADIOACTIVE SEED GUIDED BREAST BIOPSY
Anesthesia: General | Site: Breast | Laterality: Left

## 2019-03-31 MED ORDER — FENTANYL CITRATE (PF) 100 MCG/2ML IJ SOLN
INTRAMUSCULAR | Status: DC | PRN
  Administered 2019-03-31 (×2): 50 ug via INTRAVENOUS

## 2019-03-31 MED ORDER — HYDROCODONE-ACETAMINOPHEN 5-325 MG PO TABS: 1.0000 | ORAL_TABLET | Freq: Four times a day (QID) | ORAL | 0 refills | Status: DC | PRN

## 2019-03-31 MED ORDER — MIDAZOLAM HCL 2 MG/2ML IJ SOLN: 1.0000 mg | INTRAMUSCULAR | Status: DC | PRN

## 2019-03-31 MED ORDER — ONDANSETRON HCL 4 MG/2ML IJ SOLN
INTRAMUSCULAR | Status: AC
  Filled 2019-03-31: qty 4

## 2019-03-31 MED ORDER — ONDANSETRON HCL 4 MG/2ML IJ SOLN: 4.0000 mg | Freq: Once | INTRAMUSCULAR | Status: DC | PRN

## 2019-03-31 MED ORDER — FENTANYL CITRATE (PF) 100 MCG/2ML IJ SOLN: 50.0000 ug | INTRAMUSCULAR | Status: DC | PRN

## 2019-03-31 MED ORDER — CIPROFLOXACIN IN D5W 400 MG/200ML IV SOLN
INTRAVENOUS | Status: DC | PRN
  Administered 2019-03-31: 400 mg via INTRAVENOUS

## 2019-03-31 MED ORDER — OXYCODONE HCL 5 MG/5ML PO SOLN: 5.0000 mg | Freq: Once | ORAL | Status: AC | PRN

## 2019-03-31 MED ORDER — PROPOFOL 10 MG/ML IV BOLUS
INTRAVENOUS | Status: DC | PRN
  Administered 2019-03-31: 30 mg via INTRAVENOUS
  Administered 2019-03-31: 200 mg via INTRAVENOUS
  Administered 2019-03-31: 100 mg via INTRAVENOUS

## 2019-03-31 MED ORDER — LIDOCAINE 2% (20 MG/ML) 5 ML SYRINGE
INTRAMUSCULAR | Status: AC
  Filled 2019-03-31: qty 5

## 2019-03-31 MED ORDER — CHLORHEXIDINE GLUCONATE CLOTH 2 % EX PADS: 6.0000 | MEDICATED_PAD | Freq: Once | CUTANEOUS | Status: DC

## 2019-03-31 MED ORDER — BUPIVACAINE HCL (PF) 0.5 % IJ SOLN
INTRAMUSCULAR | Status: DC | PRN
  Administered 2019-03-31: 18 mL

## 2019-03-31 MED ORDER — ONDANSETRON HCL 4 MG/2ML IJ SOLN
INTRAMUSCULAR | Status: DC | PRN
  Administered 2019-03-31 (×2): 4 mg via INTRAVENOUS

## 2019-03-31 MED ORDER — SUGAMMADEX SODIUM 200 MG/2ML IV SOLN
INTRAVENOUS | Status: DC | PRN
  Administered 2019-03-31: 200 mg via INTRAVENOUS
  Administered 2019-03-31: 300 mg via INTRAVENOUS

## 2019-03-31 MED ORDER — CEFAZOLIN SODIUM-DEXTROSE 2-4 GM/100ML-% IV SOLN
INTRAVENOUS | Status: AC
  Filled 2019-03-31: qty 100

## 2019-03-31 MED ORDER — FENTANYL CITRATE (PF) 100 MCG/2ML IJ SOLN
INTRAMUSCULAR | Status: AC
  Filled 2019-03-31: qty 2

## 2019-03-31 MED ORDER — OXYCODONE HCL 5 MG PO TABS
5.0000 mg | ORAL_TABLET | Freq: Once | ORAL | Status: AC | PRN
  Administered 2019-03-31: 15:00:00 5 mg via ORAL

## 2019-03-31 MED ORDER — ACETAMINOPHEN 500 MG PO TABS
1000.0000 mg | ORAL_TABLET | ORAL | Status: AC
  Administered 2019-03-31: 10:00:00 1000 mg via ORAL

## 2019-03-31 MED ORDER — KETOROLAC TROMETHAMINE 30 MG/ML IJ SOLN
INTRAMUSCULAR | Status: DC | PRN
  Administered 2019-03-31: 30 mg via INTRAVENOUS

## 2019-03-31 MED ORDER — ROCURONIUM BROMIDE 100 MG/10ML IV SOLN
INTRAVENOUS | Status: DC | PRN
  Administered 2019-03-31: 10 mg via INTRAVENOUS
  Administered 2019-03-31: 50 mg via INTRAVENOUS

## 2019-03-31 MED ORDER — CEFAZOLIN SODIUM-DEXTROSE 1-4 GM/50ML-% IV SOLN
INTRAVENOUS | Status: AC
  Filled 2019-03-31: qty 50

## 2019-03-31 MED ORDER — ACETAMINOPHEN 500 MG PO TABS
ORAL_TABLET | ORAL | Status: AC
  Filled 2019-03-31: qty 2

## 2019-03-31 MED ORDER — MIDAZOLAM HCL 5 MG/5ML IJ SOLN
INTRAMUSCULAR | Status: DC | PRN
  Administered 2019-03-31: 2 mg via INTRAVENOUS

## 2019-03-31 MED ORDER — OXYCODONE HCL 5 MG PO TABS
ORAL_TABLET | ORAL | Status: AC
  Filled 2019-03-31: qty 1

## 2019-03-31 MED ORDER — SUCCINYLCHOLINE CHLORIDE 200 MG/10ML IV SOSY
PREFILLED_SYRINGE | INTRAVENOUS | Status: DC | PRN
  Administered 2019-03-31: 120 mg via INTRAVENOUS

## 2019-03-31 MED ORDER — CIPROFLOXACIN IN D5W 400 MG/200ML IV SOLN
INTRAVENOUS | Status: AC
  Filled 2019-03-31: qty 200

## 2019-03-31 MED ORDER — SUCCINYLCHOLINE CHLORIDE 200 MG/10ML IV SOSY
PREFILLED_SYRINGE | INTRAVENOUS | Status: AC
  Filled 2019-03-31: qty 10

## 2019-03-31 MED ORDER — LIDOCAINE 2% (20 MG/ML) 5 ML SYRINGE
INTRAMUSCULAR | Status: DC | PRN
  Administered 2019-03-31: 80 mg via INTRAVENOUS

## 2019-03-31 MED ORDER — CEFAZOLIN SODIUM-DEXTROSE 2-4 GM/100ML-% IV SOLN: 2.0000 g | INTRAVENOUS | Status: DC

## 2019-03-31 MED ORDER — HEPARIN SODIUM (PORCINE) 5000 UNIT/ML IJ SOLN: 5000.0000 [IU] | Freq: Once | INTRAMUSCULAR | Status: DC

## 2019-03-31 MED ORDER — ESMOLOL HCL 100 MG/10ML IV SOLN
INTRAVENOUS | Status: DC | PRN
  Administered 2019-03-31 (×2): 20 mg via INTRAVENOUS

## 2019-03-31 MED ORDER — MIDAZOLAM HCL 2 MG/2ML IJ SOLN
INTRAMUSCULAR | Status: AC
  Filled 2019-03-31: qty 2

## 2019-03-31 MED ORDER — BUPIVACAINE HCL (PF) 0.25 % IJ SOLN
INTRAMUSCULAR | Status: AC
  Filled 2019-03-31: qty 30

## 2019-03-31 MED ORDER — FENTANYL CITRATE (PF) 100 MCG/2ML IJ SOLN
25.0000 ug | INTRAMUSCULAR | Status: DC | PRN
  Administered 2019-03-31 (×3): 50 ug via INTRAVENOUS

## 2019-03-31 MED ORDER — PROPOFOL 10 MG/ML IV BOLUS
INTRAVENOUS | Status: AC
  Filled 2019-03-31: qty 40

## 2019-03-31 MED ORDER — LACTATED RINGERS IV SOLN
INTRAVENOUS | Status: DC
  Administered 2019-03-31 (×2): via INTRAVENOUS

## 2019-03-31 SURGICAL SUPPLY — 53 items
ADH SKN CLS APL DERMABOND .7 (GAUZE/BANDAGES/DRESSINGS) ×1
APL SKNCLS STERI-STRIP NONHPOA (GAUZE/BANDAGES/DRESSINGS)
APPLIER CLIP 9.375 MED OPEN (MISCELLANEOUS)
APR CLP MED 9.3 20 MLT OPN (MISCELLANEOUS)
BENZOIN TINCTURE PRP APPL 2/3 (GAUZE/BANDAGES/DRESSINGS) IMPLANT
BINDER BREAST LRG (GAUZE/BANDAGES/DRESSINGS) IMPLANT
BINDER BREAST MEDIUM (GAUZE/BANDAGES/DRESSINGS) IMPLANT
BINDER BREAST XLRG (GAUZE/BANDAGES/DRESSINGS) IMPLANT
BINDER BREAST XXLRG (GAUZE/BANDAGES/DRESSINGS) IMPLANT
BLADE HEX COATED 2.75 (ELECTRODE) IMPLANT
BLADE SURG 10 STRL SS (BLADE) IMPLANT
BLADE SURG 15 STRL LF DISP TIS (BLADE) ×1 IMPLANT
BLADE SURG 15 STRL SS (BLADE) ×2
CANISTER SUC SOCK COL 7IN (MISCELLANEOUS) IMPLANT
CANISTER SUCT 1200ML W/VALVE (MISCELLANEOUS) ×2 IMPLANT
CLIP APPLIE 9.375 MED OPEN (MISCELLANEOUS) IMPLANT
CLIP VESOCCLUDE SM WIDE 6/CT (CLIP) IMPLANT
COVER BACK TABLE REUSABLE LG (DRAPES) ×2 IMPLANT
COVER MAYO STAND REUSABLE (DRAPES) ×2 IMPLANT
COVER PROBE W GEL 5X96 (DRAPES) ×2 IMPLANT
COVER WAND RF STERILE (DRAPES) IMPLANT
DECANTER SPIKE VIAL GLASS SM (MISCELLANEOUS) IMPLANT
DERMABOND ADVANCED (GAUZE/BANDAGES/DRESSINGS) ×1
DERMABOND ADVANCED .7 DNX12 (GAUZE/BANDAGES/DRESSINGS) ×1 IMPLANT
DRAPE HALF SHEET 70X43 (DRAPES) ×2 IMPLANT
DRAPE LAPAROTOMY 100X72 PEDS (DRAPES) ×2 IMPLANT
DRSG PAD ABDOMINAL 8X10 ST (GAUZE/BANDAGES/DRESSINGS) IMPLANT
ELECT COATED BLADE 2.86 ST (ELECTRODE) ×2 IMPLANT
ELECT REM PT RETURN 9FT ADLT (ELECTROSURGICAL) ×2
ELECTRODE REM PT RTRN 9FT ADLT (ELECTROSURGICAL) ×1 IMPLANT
GAUZE SPONGE 4X4 12PLY STRL LF (GAUZE/BANDAGES/DRESSINGS) IMPLANT
GLOVE BIO SURGEON STRL SZ8 (GLOVE) ×2 IMPLANT
GOWN STRL REUS W/ TWL LRG LVL3 (GOWN DISPOSABLE) ×1 IMPLANT
GOWN STRL REUS W/ TWL XL LVL3 (GOWN DISPOSABLE) ×1 IMPLANT
GOWN STRL REUS W/TWL LRG LVL3 (GOWN DISPOSABLE) ×2
GOWN STRL REUS W/TWL XL LVL3 (GOWN DISPOSABLE) ×2
KIT MARKER MARGIN INK (KITS) ×2 IMPLANT
NDL HYPO 25X1 1.5 SAFETY (NEEDLE) ×1 IMPLANT
NEEDLE HYPO 25X1 1.5 SAFETY (NEEDLE) ×2 IMPLANT
NS IRRIG 1000ML POUR BTL (IV SOLUTION) IMPLANT
PACK BASIN DAY SURGERY FS (CUSTOM PROCEDURE TRAY) ×2 IMPLANT
PENCIL SMOKE EVACUATOR (MISCELLANEOUS) ×2 IMPLANT
SCRUB TECHNI CARE 4 OZ NO DYE (MISCELLANEOUS) ×2 IMPLANT
SLEEVE SCD COMPRESS KNEE MED (MISCELLANEOUS) ×2 IMPLANT
SPONGE LAP 18X18 RF (DISPOSABLE) ×3 IMPLANT
STRIP CLOSURE SKIN 1/4X4 (GAUZE/BANDAGES/DRESSINGS) IMPLANT
SUT MNCRL AB 4-0 PS2 18 (SUTURE) IMPLANT
SUT VIC AB 4-0 SH 18 (SUTURE) ×2 IMPLANT
SUT VICRYL 3-0 CR8 SH (SUTURE) IMPLANT
TOWEL GREEN STERILE FF (TOWEL DISPOSABLE) ×2 IMPLANT
TRAY FAXITRON CT DISP (TRAY / TRAY PROCEDURE) ×2 IMPLANT
TUBE CONNECTING 20X1/4 (TUBING) ×2 IMPLANT
YANKAUER SUCT BULB TIP NO VENT (SUCTIONS) ×2 IMPLANT

## 2019-03-31 NOTE — Anesthesia Preprocedure Evaluation (Addendum)
Anesthesia Evaluation  Patient identified by MRN, date of birth, ID band Patient awake    Reviewed: Allergy & Precautions, NPO status , Patient's Chart, lab work & pertinent test results  History of Anesthesia Complications Negative for: history of anesthetic complications  Airway Mallampati: II  TM Distance: >3 FB Neck ROM: Full    Dental  (+) Dental Advisory Given, Teeth Intact   Pulmonary Current SmokerPatient did not abstain from smoking.,    Pulmonary exam normal        Cardiovascular hypertension, Pt. on medications Normal cardiovascular exam     Neuro/Psych  Headaches, PSYCHIATRIC DISORDERS Anxiety Depression  Vertigo Pulsatile tinnitus     GI/Hepatic GERD  Medicated and Controlled,(+)     substance abuse  marijuana use,  S/p gastric sleeve    Endo/Other  diabetes, Type 2, Oral Hypoglycemic AgentsMorbid obesity  Renal/GU negative Renal ROS     Musculoskeletal  (+) Arthritis , Osteoarthritis,    Abdominal   Peds  (+) ATTENTION DEFICIT DISORDER WITHOUT HYPERACTIVITY Hematology negative hematology ROS (+)   Anesthesia Other Findings Anaphylaxis to penicillin   Reproductive/Obstetrics  PCOS                             Anesthesia Physical Anesthesia Plan  ASA: III  Anesthesia Plan: General   Post-op Pain Management:    Induction: Intravenous  PONV Risk Score and Plan: 3 and Treatment may vary due to age or medical condition, Ondansetron, Dexamethasone and Midazolam  Airway Management Planned: Oral ETT  Additional Equipment: None  Intra-op Plan:   Post-operative Plan: Extubation in OR  Informed Consent: I have reviewed the patients History and Physical, chart, labs and discussed the procedure including the risks, benefits and alternatives for the proposed anesthesia with the patient or authorized representative who has indicated his/her understanding and  acceptance.     Dental advisory given  Plan Discussed with: CRNA and Anesthesiologist  Anesthesia Plan Comments:        Anesthesia Quick Evaluation

## 2019-03-31 NOTE — Anesthesia Postprocedure Evaluation (Signed)
Anesthesia Post Note  Patient: [place]  Procedure(s) Performed: RADIOACTIVE SEED GUIDED EXCISIONAL LEFT BREAST BIOPSY X2 (Left Breast)     Patient location during evaluation: PACU Anesthesia Type: General Level of consciousness: awake and alert Pain management: pain level controlled Vital Signs Assessment: post-procedure vital signs reviewed and stable Respiratory status: spontaneous breathing, nonlabored ventilation and respiratory function stable Cardiovascular status: blood pressure returned to baseline and stable Postop Assessment: no apparent nausea or vomiting Anesthetic complications: no    Last Vitals:  Vitals:   03/31/19 1400 03/31/19 1428  BP: 121/72 126/70  Pulse: 90 82  Resp: 14 18  Temp:  36.9 C  SpO2: 94% 96%    Last Pain:  Vitals:   03/31/19 1428  TempSrc: Oral  PainSc: 2                  Audry Pili

## 2019-03-31 NOTE — Op Note (Signed)
[name]  [birth year] 31 March 2019    PCP:  Lujean Amel, MD   Surgeon: Kaylyn Lim, MD, FACS  Asst:  none  Anes:  general  Preop Dx: Two intraductal papillomata  Postop Dx: Same path pending  Procedure: Radioactive seed localization (x2) of two areas in the left breast Location Surgery: CDS 5 Complications: none  EBL:   50 cc  Drains: none  Description of Procedure:  The patient was taken to OR 5 .  After anesthesia was administered and the patient was prepped  with Technicare and a timeout was performed.  The area was mapped and two areas in the left breast were found and a single incision made between them.  Bovie dissection was used and the masses were dissected in to the wound.  Bleeding laterally and deep could not be seen (no suction) until the specimens were out.  The more inferior seed was more mobile and that frustrated the second mass removal.  Both clips and both seeds were removed.  Margins were not clear and the specimen was not marked.  The lateral bleeding vessel was controled with 2 fig of 8 sutures of 4-0 vicryl.  0.5 % marcaine was placed in the wound.    The patient tolerated the procedure well and was taken to the PACU in stable condition.     Matt B. Hassell Done, [place], [facility], [place]

## 2019-03-31 NOTE — Transfer of Care (Signed)
Immediate Anesthesia Transfer of Care Note  Patient: Alice Simpson  Procedure(s) Performed: RADIOACTIVE SEED GUIDED EXCISIONAL LEFT BREAST BIOPSY X2 (Left Breast)  Patient Location: PACU  Anesthesia Type:General  Level of Consciousness: drowsy and patient cooperative  Airway & Oxygen Therapy: Patient Spontanous Breathing and Patient connected to face mask oxygen  Post-op Assessment: Report given to RN and Post -op Vital signs reviewed and stable  Post vital signs: Reviewed and stable  Last Vitals:  Vitals Value Taken Time  BP 137/67 03/31/19 1303  Temp    Pulse 95 03/31/19 1305  Resp 20 03/31/19 1305  SpO2 100 % 03/31/19 1305  Vitals shown include unvalidated device data.  Last Pain:  Vitals:   03/31/19 0946  TempSrc: Oral  PainSc: 0-No pain      Patients Stated Pain Goal: 3 ([id number])  Complications: No apparent anesthesia complications

## 2019-03-31 NOTE — Interval H&P Note (Signed)
History and Physical Interval Note:  03/31/2019 11:06 AM  [place]  has presented today for surgery, with the diagnosis of INTRADUCTAL PAPILLOMA LEFT BREAST.  The various methods of treatment have been discussed with the patient and family. After consideration of risks, benefits and other options for treatment, the patient has consented to  Procedure(s): RADIOACTIVE SEED GUIDED EXCISIONAL LEFT BREAST BIOPSY X2 (Left) as a surgical intervention.  The patient's history has been reviewed, patient examined, no change in status, stable for surgery.  I have reviewed the patient's chart and labs.  Questions were answered to the patient's satisfaction.     Pedro Earls

## 2019-03-31 NOTE — Discharge Instructions (Signed)
May take tylenol after 5pm if needed. May take ibuprofen after 9pm if needed. May take prescription pain med after 9pm, if needed.  Breast Biopsy, Care After These instructions give you information about caring for yourself after your procedure. Your doctor may also give you more specific instructions. Call your doctor if you have any problems or questions after your procedure. What can I expect after the procedure? After your procedure, it is common to have:  Bruising on your breast.  Numbness, tingling, or pain near your biopsy site. Follow these instructions at home: Medicines  Take over-the-counter and prescription medicines only as told by your doctor.  Do not drive for 24 hours if you were given a medicine to help you relax (sedative) during your procedure.  Do not drink alcohol while taking pain medicine.  Do not drive or use heavy machinery while taking prescription pain medicine. Biopsy site care      Follow instructions from your doctor about how to take care of your cut from surgery (incision) or your puncture area. Make sure you: ? Wash your hands with soap and water before you change your bandage (dressing). If you cannot use soap and water, use hand sanitizer. ? Change your bandage as told by your doctor. ? Leave stitches (sutures), skin glue, or skin tape (adhesive strips) in place. They may need to stay in place for 2 weeks or longer. If tape strips get loose and curl up, you may trim the loose edges. Do not remove tape strips completely unless your doctor says it is okay.  If you have stitches, keep them dry when you take a bath or a shower.  Check your cut or puncture area every day for signs of infection. Check for: ? Redness, swelling, or pain. ? Fluid or blood. ? Warmth. ? Pus or a bad smell.  Protect the biopsy area. Do not let the area get bumped. Activity  If you had a cut during your procedure, avoid activities that could pull your cut open. These  include: ? Stretching. ? Reaching over your head. ? Exercise. ? Sports. ? Lifting anything that weighs more than 3 lb (1.4 kg).  Return to your normal activities as told by your doctor. Ask your doctor what activities are safe for you. Managing pain, stiffness, and swelling If told, put ice on the biopsy site to relieve swelling:  Put ice in a plastic bag.  Place a towel between your skin and the bag.  Leave the ice on for 20 minutes, 2-3 times a day. General instructions  Continue your normal diet.  Wear a good support bra for as long as told by your doctor.  Get checked for extra fluid around your lymph nodes (lymphedema) as often as told by your doctor.  Keep all follow-up visits as told by your doctor. This is important. Contact a doctor if:  You notice any of the following at the biopsy site: ? More redness, swelling, or pain. ? More fluid or blood coming from the site. ? The site feels warm to the touch. ? Pus or a bad smell coming from the site. ? The site breaks open after the stitches or skin tape strips have been removed.  You have a rash.  You have a fever. Get help right away if:  You have more bleeding from the biopsy site. Get help right away if bleeding is more than a small spot.  You have trouble breathing.  You have red streaks around the biopsy  site. Summary  After your procedure, it is common to have bruising, numbness, tingling, or pain near the biopsy site.  Do not drive or use heavy machinery while taking prescription pain medicine.  Wear a good support bra for as long as told by your doctor.  If you had a cut during your procedure, avoid activities that may pull the cut open. Ask your doctor what activities are safe for you. This information is not intended to replace advice given to you by your health care provider. Make sure you discuss any questions you have with your health care provider. Document Released: 02/28/2009 Document Revised:  10/21/2017 Document Reviewed: 10/21/2017 Elsevier Patient Education  [place] Instructions  Activity: Get plenty of rest for the remainder of the day. A responsible individual must stay with you for 24 hours following the procedure.  For the next 24 hours, DO NOT: -Drive a car -Paediatric nurse -Drink alcoholic beverages -Take any medication unless instructed by your physician -Make any legal decisions or sign important papers.  Meals: Start with liquid foods such as gelatin or soup. Progress to regular foods as tolerated. Avoid greasy, spicy, heavy foods. If nausea and/or vomiting occur, drink only clear liquids until the nausea and/or vomiting subsides. Call your physician if vomiting continues.  Special Instructions/Symptoms: Your throat may feel dry or sore from the anesthesia or the breathing tube placed in your throat during surgery. If this causes discomfort, gargle with warm salt water. The discomfort should disappear within 24 hours.  If you had a scopolamine patch placed behind your ear for the management of post- operative nausea and/or vomiting:  1. The medication in the patch is effective for 72 hours, after which it should be removed.  Wrap patch in a tissue and discard in the trash. Wash hands thoroughly with soap and water. 2. You may remove the patch earlier than 72 hours if you experience unpleasant side effects which may include dry mouth, dizziness or visual disturbances. 3. Avoid touching the patch. Wash your hands with soap and water after contact with the patch.  No tylenol until after 4pm today.

## 2019-03-31 NOTE — Anesthesia Procedure Notes (Signed)
Procedure Name: Intubation Date/Time: 03/31/2019 11:24 AM Performed by: Gwyndolyn Saxon, CRNA Pre-anesthesia Checklist: Patient identified, Emergency Drugs available, Suction available and Patient being monitored Patient Re-evaluated:Patient Re-evaluated prior to induction Oxygen Delivery Method: Circle system utilized Preoxygenation: Pre-oxygenation with 100% oxygen Induction Type: IV induction Ventilation: Mask ventilation without difficulty Laryngoscope Size: Miller and 2 Grade View: Grade II Tube type: Oral Tube size: 7.0 mm Number of attempts: 1 Airway Equipment and Method: Patient positioned with wedge pillow and Stylet Placement Confirmation: ETT inserted through vocal cords under direct vision,  positive ETCO2 and breath sounds checked- equal and bilateral Secured at: 21 cm Tube secured with: Tape Dental Injury: Teeth and Oropharynx as per pre-operative assessment

## 2019-04-03 ENCOUNTER — Encounter (HOSPITAL_BASED_OUTPATIENT_CLINIC_OR_DEPARTMENT_OTHER): Payer: Self-pay | Admitting: Surgery

## 2019-04-05 LAB — SURGICAL PATHOLOGY

## 2019-04-25 ENCOUNTER — Other Ambulatory Visit: Payer: Self-pay

## 2019-04-25 ENCOUNTER — Ambulatory Visit (INDEPENDENT_AMBULATORY_CARE_PROVIDER_SITE_OTHER): Payer: Medicaid Other | Admitting: Internal Medicine

## 2019-04-25 ENCOUNTER — Encounter: Payer: Self-pay | Admitting: Internal Medicine

## 2019-04-25 VITALS — BP 128/78 | HR 78 | Ht 68.0 in | Wt 320.0 lb

## 2019-04-25 DIAGNOSIS — E1165 Type 2 diabetes mellitus with hyperglycemia: Secondary | ICD-10-CM | POA: Diagnosis not present

## 2019-04-25 LAB — POCT GLYCOSYLATED HEMOGLOBIN (HGB A1C): Hemoglobin A1C: 8.6 % — AB (ref 4.0–5.6)

## 2019-04-25 MED ORDER — METFORMIN HCL ER 500 MG PO TB24: 1000.0000 mg | ORAL_TABLET | Freq: Two times a day (BID) | ORAL | 3 refills | Status: DC

## 2019-04-25 MED ORDER — OZEMPIC (0.25 OR 0.5 MG/DOSE) 2 MG/1.5ML ~~LOC~~ SOPN: 0.5000 mg | PEN_INJECTOR | SUBCUTANEOUS | 5 refills | Status: DC

## 2019-04-25 MED ORDER — GLIPIZIDE ER 5 MG PO TB24: 5.0000 mg | ORAL_TABLET | Freq: Two times a day (BID) | ORAL | 3 refills | Status: DC

## 2019-04-25 NOTE — Addendum Note (Signed)
Addended by: Cardell Peach I on: 04/25/2019 12:43 PM   Modules accepted: Orders

## 2019-04-25 NOTE — Progress Notes (Addendum)
Patient ID: Alice Simpson [name], female   DOB: 1965-10-29, 53 y.o.   MRN: [id number]   This visit occurred during the SARS-CoV-2 public health emergency.  Safety protocols were in place, including screening questions prior to the visit, additional usage of staff PPE, and extensive cleaning of exam room while observing appropriate contact time as indicated for disinfecting solutions.   HPI: Meta is a 53 y.o.-year-old female, referred by her PCP, Dr. Orland Mustard, for management of DM2, dx in 2007, non-insulin-dependent, uncontrolled, without long-term complications. I saw pt before for her diabetes, last OV 09/2014.  At that time, HbA1c was 6.9%.  She had gastric sleeve surgery 09/2014 and she started to lose weight afterwards, however, she then lost insurance >> could not follow up. She gained weight and her DM worsened again.  In 07/2018, after a viral respiratory illness >> gained weight, had hair loss, increased HbA1c. She now also has enuresis, and had 2 episodes in the last 2 months.  These actually started before starting Jardiance in 11/2018.  Reviewed latest HbA1c level: 03/20/2019: HbA1c 9.2% 11/16/2018: HbA1c 8.3% 08/09/2018: HbA1c 7.4% Lab Results  Component Value Date   HGBA1C 6.9 (H) 09/24/2014   HGBA1C 6.9 (H) 09/21/2014   HGBA1C 7.5 (H) 02/22/2014   HGBA1C 7.6 (H) 11/02/2013   HGBA1C 7.7 (H) 06/09/2013   Pt is on a regimen of: - Metformin ER 1500 mg with dinner - Glipizide XL 5 mg before breakfast - Jardiance 10 mg before breakfast - started 11/2018  Pt checks her sugars 1x a day and they are: - am: 200-240 - 2h after b'fast: n/c - before lunch: n/c - 2h after lunch: n/c - before dinner: n/c - 2h after dinner: n/c - bedtime: n/c - nighttime: n/c Lowest sugar was 132; + hypoglycemia awareness at 90.  Highest sugar was 299.  Glucometer: AccuChek x2  Pt's meals are: - Breakfast: bagel, cereal, fruit - Lunch: sandwich - Dinner: chicken, veggies -  Snacks: She continues to see nutrition. She has a history of binge eating disorder.  - no CKD, last BUN/creatinine:  11/16/2018: 13/0.64, GFR 97, glucose 206 08/09/2018: 11/0.67, GFR 92, glucose 122 Lab Results  Component Value Date   BUN 8 07/06/2018   BUN 8 12/16/2017   CREATININE 0.69 07/06/2018   CREATININE 0.70 12/16/2017  01/13/2018: ACR 11.2 10/13/2017: ACR 19.8 On Cozaar 50 mg daily.  -+ HL; last set of lipids: 08/09/2018: 224/192/68/118 08/08/2013: 185/151/69/86 No results found for: CHOL, HDL, LDLCALC, LDLDIRECT, TRIG, CHOLHDL On Crestor 20 mg daily.  - last eye exam was in 12/2018. No DR. Has macular edema. She is seeing Dr. Prudencio Burly.  - no numbness and tingling in her feet.  Pt has FH of DM in M, F, other siblings.  She also has a history of breast nodule and recently had radioactive seeds implant >> now out >> nodules not cancerous.  She was dx with PCOS in 2007.  At last visit, she was on Ortho-micronor (norethindrone) and has withdrawal bleedings every month.  Tried another OCP >> did not feel well on it.  She continues on a progesterone OCP.  She tried to stop it but she had mood changes.  She has a history of hip replacement in 2016  03/2019: TSH reportedly normal  ROS: Constitutional: + Weight gain, no weight loss, + fatigue, + hot flashes, no subjective hypothermia, + nocturia, + enuresis, + poor sleep Eyes: no blurry vision, no xerophthalmia ENT: no sore throat, no nodules  palpated in neck, no dysphagia, no odynophagia, no hoarseness, + tinnitus, no hypoacusis Cardiovascular: no CP, no SOB, no palpitations, no leg swelling Respiratory: no cough, no SOB, no wheezing Gastrointestinal: no N, no V, no D, no C, + acid reflux Musculoskeletal: no muscle, + joint aches Skin: no rash, + hair loss, + excessive hair growth Neurological: no tremors, no numbness or tingling/no dizziness/no HAs Psychiatric: + depression, + anxiety + Low libido  Past Medical History:   Diagnosis Date  . ADD (attention deficit disorder)   . Anxiety   . Arthritis   . Depression   . Diabetes mellitus without complication ([place])   . Hyperlipidemia   . Migraine   . Neuropathy   . Neuropathy   . Osteoarthritis   . PCOS (polycystic ovarian syndrome)   . Pulsatile tinnitus 07/06/2018  . Vertigo    Past Surgical History:  Procedure Laterality Date  . BUNIONECTOMY    . CESAREAN SECTION    . LAPAROSCOPIC GASTRIC SLEEVE RESECTION N/A 09/24/2014   Procedure: LAPAROSCOPIC GASTRIC SLEEVE RESECTION upper endoscopy;  Surgeon: Johnathan Hausen, MD;  Location: [facility];  Service: General;  Laterality: N/A;  . RADIOACTIVE SEED GUIDED EXCISIONAL BREAST BIOPSY Left 03/31/2019   Procedure: RADIOACTIVE SEED GUIDED EXCISIONAL LEFT BREAST BIOPSY X2;  Surgeon: Johnathan Hausen, MD;  Location: [facility];  Service: General;  Laterality: Left;  . TOTAL HIP ARTHROPLASTY Right 11/20/2014  . TOTAL HIP ARTHROPLASTY Right 11/20/2014   Procedure: TOTAL HIP ARTHROPLASTY ANTERIOR APPROACH;  Surgeon: Melrose Nakayama, MD;  Location: [facility];  Service: Orthopedics;  Laterality: Right;  . wisdom    . WISDOM TOOTH EXTRACTION     Social History   Socioeconomic History  . Marital status: Single    Spouse name: Not on file  . Number of children: 2  . Years of education: Not on file  . Highest education level: Some college, no degree  Occupational History  . Occupation: Disability  Social Needs  . Financial resource strain: Not on file  . Food insecurity    Worry: Not on file    Inability: Not on file  . Transportation needs    Medical: Not on file    Non-medical: Not on file  Tobacco Use  . Smoking status: Current Some Day Smoker, 1 pack per month    Packs/day:     Years:     Pack years:     Types: Cigarettes    Last attempt to quit: 05/18/2004    Years since quitting: 14.9  . Smokeless tobacco: Never Used  Substance and Sexual Activity  . Alcohol use: Yes    Comment: rarely when  does is liquor   . Drug use: Yes    Types: Marijuana    Comment: 1/ every 3 months   . Sexual activity: Not on file  Lifestyle  . Physical activity    Days per week: Not on file    Minutes per session: Not on file  . Stress: Not on file  Relationships  . Social Herbalist on phone: Not on file    Gets together: Not on file    Attends religious service: Not on file    Active member of club or organization: Not on file    Attends meetings of clubs or organizations: Not on file    Relationship status: Not on file  . Intimate partner violence    Fear of current or ex partner: Not on file  Emotionally abused: Not on file    Physically abused: Not on file    Forced sexual activity: Not on file  Other Topics Concern  . Not on file  Social History Narrative   Left handed   2 cups of caffeine daily    Lives at home alone    Current Outpatient Medications on File Prior to Visit  Medication Sig Dispense Refill  . busPIRone (BUSPAR) 10 MG tablet Take 10 mg by mouth 2 (two) times daily.    . Calcium Citrate-Vitamin D (CALCIUM CITRATE + PO) Take 600 mg by mouth 3 (three) times daily.    . DULoxetine (CYMBALTA) 30 MG capsule Take 40 mg by mouth daily.     . Empagliflozin (JARDIANCE PO) Take by mouth.    Marland Kitchen glipiZIDE (GLUCOTROL XL) 5 MG 24 hr tablet Take 5 mg by mouth daily. with food    . HYDROcodone-acetaminophen (NORCO/VICODIN) 5-325 MG tablet Take 1 tablet by mouth every 6 (six) hours as needed for moderate pain. 15 tablet 0  . lisdexamfetamine (VYVANSE) 10 MG capsule Take 10 mg by mouth daily. 1-2 tablets in the am    . LORazepam (ATIVAN) 0.5 MG tablet Take 0.5 mg by mouth every 8 (eight) hours.    Marland Kitchen losartan (COZAAR) 25 MG tablet Take 25 mg by mouth daily.    . metFORMIN (GLUCOPHAGE-XR) 500 MG 24 hr tablet Take 1 tablet (500 mg total) by mouth 2 (two) times daily. (Patient taking differently: Take 500 mg by mouth. 3 tablets with evening meal) 60 tablet 2  . Multiple Vitamin  (MULTI-VITAMINS) TABS Take 1 tablet by mouth 2 (two) times daily.     . norethindrone (MICRONOR,CAMILA,ERRIN) 0.35 MG tablet Take 1 tablet (0.35 mg total) by mouth daily. (Patient taking differently: Take 1 tablet by mouth at bedtime. ) 1 Package 11  . ondansetron (ZOFRAN) 4 MG tablet Take 1 tablet (4 mg total) by mouth every 6 (six) hours. 12 tablet 0  . rosuvastatin (CRESTOR) 10 MG tablet Take 20 mg by mouth daily.     . Vitamin D, Ergocalciferol, (DRISDOL) 1.25 MG (50000 UT) CAPS capsule Take 50,000 Units by mouth once a week.     No current facility-administered medications on file prior to visit.    Allergies  Allergen Reactions  . Lactose Intolerance (Gi)   . Penicillin G Hives    hives  . Prochlorperazine Itching and Anxiety   Family History  Problem Relation Age of Onset  . Diabetes Other   . Depression Other   . Anxiety disorder Other   . High blood pressure Mother   . High Cholesterol Mother   . Diabetes Mother   . Diabetes Father   . High blood pressure Father   . High Cholesterol Father   . Breast cancer Neg Hx     PE: BP 128/78   Pulse 78   Ht 5\' 8"  (1.727 m)   Wt (!) 320 lb (145.2 kg)   SpO2 98%   BMI 48.66 kg/m  Wt Readings from Last 3 Encounters:  04/25/19 (!) 320 lb (145.2 kg)  03/31/19 (!) 321 lb 14 oz (146 kg)  07/06/18 (!) 308 lb (139.7 kg)   Constitutional: overweight, in NAD Eyes: PERRLA, EOMI, no exophthalmos ENT: moist mucous membranes, no thyromegaly, no cervical lymphadenopathy Cardiovascular: RRR, No MRG Respiratory: CTA B Gastrointestinal: abdomen soft, NT, ND, BS+ Musculoskeletal: no deformities, strength intact in all 4 Skin: moist, warm, no rashes Neurological: no tremor with outstretched hands,  DTR normal in all 4  ASSESSMENT: 1. DM2, non-insulin-dependent, uncontrolled, without long-term complications but with hyperglycemia  PLAN:  1. Patient with long-standing, uncontrolled diabetes, on oral antidiabetic regimen, which became  insufficient. Today, HbA1c is 8.6% (higher). -She describes that her sugars improved significantly after her gastric bypass surgery in 2016, however, she did not follow-up due to loss of insurance.  She also had hip replacement soon after the bypass surgery and she started to gain weight afterwards.  Her diabetes worsened again.  The sugars became even worse in 07/2018 after a flu like illness.  Jardiance was added 11/2018 but she had increased GERD and enuresis afterwards.  Her Metformin dose was also increased and she usually tolerates it well with only occasional diarrhea. -At this visit, she tells me she did not check her sugars in the last 2 days due to anxiety caused by high blood sugars.  Whenever she checks before, her sugars were in the 200s in the morning she did not check later in the day.  I strongly advised her to start checking later in the day, also, rotating check times -At this visit, we discussed about stopping Jardiance for now since she is having side effects from it, increasing the Metformin to target dose, also increasing glipizide to twice a day, and adding a GLP-1 receptor agonist.  She does not have a family history of medullary thyroid cancer or personal history of pancreatitis.  Discussed about benefits and possible side effects from this class of medications.  We will start with a low-dose of Ozempic and increase as tolerated.  I am hoping that her insurance covers this. - I suggested to:  Patient Instructions  Please increase: - Metformin to 1000 mg 2x a day with meals - Glipizide XL 5 mg 2x a day 15-30 min before meals  Stop Jardiance.  Please start: - Ozempic 0.25 mg weekly in a.m. (for example on Sunday morning) x 4 weeks, then increase to 0.5 mg weekly in a.m. if no nausea or hypoglycemia.  Please return in 3 months with your sugar log.   - Strongly advised her to start checking sugars at different times of the day - check 1-2x a day, rotating checks - discussed  about CBG targets for treatment: 80-130 mg/dL before meals and <180 mg/dL after meals; target HbA1c <7%. - given sugar log and advised how to fill it and to bring it at next appt  - given foot care handout and explained the principles  - given instructions for hypoglycemia management "15-15 rule"  - advised for yearly eye exams  - Return to clinic in 3 mo with sugar log   Philemon Kingdom, MD PhD [facility] Endocrinology

## 2019-04-25 NOTE — Patient Instructions (Signed)
Please increase: - Metformin to 1000 mg 2x a day with meals - Glipizide XL 5 mg 2x a day 15-30 min before meals  Stop Jardiance.  Please start: - Ozempic 0.25 mg weekly in a.m. (for example on Sunday morning) x 4 weeks, then increase to 0.5 mg weekly in a.m. if no nausea or hypoglycemia.  Please return in 3 months with your sugar log.   PATIENT INSTRUCTIONS FOR TYPE 2 DIABETES:  DIET AND EXERCISE Diet and exercise is an important part of diabetic treatment.  We recommended aerobic exercise in the form of brisk walking (working between 40-60% of maximal aerobic capacity, similar to brisk walking) for 150 minutes per week (such as 30 minutes five days per week) along with 3 times per week performing 'resistance' training (using various gauge rubber tubes with handles) 5-10 exercises involving the major muscle groups (upper body, lower body and core) performing 10-15 repetitions (or near fatigue) each exercise. Start at half the above goal but build slowly to reach the above goals. If limited by weight, joint pain, or disability, we recommend daily walking in a swimming pool with water up to waist to reduce pressure from joints while allow for adequate exercise.    BLOOD GLUCOSES Monitoring your blood glucoses is important for continued management of your diabetes. Please check your blood glucoses 2-4 times a day: fasting, before meals and at bedtime (you can rotate these measurements - e.g. one day check before the 3 meals, the next day check before 2 of the meals and before bedtime, etc.).   HYPOGLYCEMIA (low blood sugar) Hypoglycemia is usually a reaction to not eating, exercising, or taking too much insulin/ other diabetes drugs.  Symptoms include tremors, sweating, hunger, confusion, headache, etc. Treat IMMEDIATELY with 15 grams of Carbs: . 4 glucose tablets .  cup regular juice/soda . 2 tablespoons raisins . 4 teaspoons sugar . 1 tablespoon honey Recheck blood glucose in 15 mins and  repeat above if still symptomatic/blood glucose <100.  RECOMMENDATIONS TO REDUCE YOUR RISK OF DIABETIC COMPLICATIONS: * Take your prescribed MEDICATION(S) * Follow a DIABETIC diet: Complex carbs, fiber rich foods, (monounsaturated and polyunsaturated) fats * AVOID saturated/trans fats, high fat foods, >2,300 mg salt per day. * EXERCISE at least 5 times a week for 30 minutes or preferably daily.  * DO NOT SMOKE OR DRINK more than 1 drink a day. * Check your FEET every day. Do not wear tightfitting shoes. Contact us if you develop an ulcer * See your EYE doctor once a year or more if needed * Get a FLU shot once a year * Get a PNEUMONIA vaccine once before and once after age 80 years  GOALS:  * Your Hemoglobin A1c of <7%  * fasting sugars need to be <130 * after meals sugars need to be <180 (2h after you start eating) * Your Systolic BP should be [id number] or lower  * Your Diastolic BP should be 80 or lower  * Your HDL (Good Cholesterol) should be 40 or higher  * Your LDL (Bad Cholesterol) should be 100 or lower. * Your Triglycerides should be 150 or lower  * Your Urine microalbumin (kidney function) should be <30 * Your Body Mass Index should be 25 or lower    Please consider the following ways to cut down carbs and fat and increase fiber and micronutrients in your diet: - substitute whole grain for white bread or pasta - substitute brown rice for white rice - substitute 90-calorie flat  bread pieces for slices of bread when possible - substitute sweet potatoes or yams for white potatoes - substitute humus for margarine - substitute tofu for cheese when possible - substitute almond or rice milk for regular milk (would not drink soy milk daily due to concern for soy estrogen influence on breast cancer risk) - substitute dark chocolate for other sweets when possible - substitute water - can add lemon or orange slices for taste - for diet sodas (artificial sweeteners will trick your body that  you can eat sweets without getting calories and will lead you to overeating and weight gain in the long run) - do not skip breakfast or other meals (this will slow down the metabolism and will result in more weight gain over time)  - can try smoothies made from fruit and almond/rice milk in am instead of regular breakfast - can also try old-fashioned (not instant) oatmeal made with almond/rice milk in am - order the dressing on the side when eating salad at a restaurant (pour less than half of the dressing on the salad) - eat as little meat as possible - can try juicing, but should not forget that juicing will get rid of the fiber, so would alternate with eating raw veg./fruits or drinking smoothies - use as little oil as possible, even when using olive oil - can dress a salad with a mix of balsamic vinegar and lemon juice, for e.g. - use agave nectar, stevia sugar, or regular sugar rather than artificial sweateners - steam or broil/roast veggies  - snack on veggies/fruit/nuts (unsalted, preferably) when possible, rather than processed foods - reduce or eliminate aspartame in diet (it is in diet sodas, chewing gum, etc) Read the labels!  Try to read Dr. Janene Harvey book: "Program for Reversing Diabetes" for other ideas for healthy eating.

## 2019-04-27 ENCOUNTER — Telehealth: Payer: Self-pay

## 2019-04-27 NOTE — Telephone Encounter (Signed)
Patient called in wanting to know about the PA for Ozempic    Please call and advise

## 2019-05-08 ENCOUNTER — Telehealth: Payer: Self-pay

## 2019-05-08 MED ORDER — BYDUREON 2 MG ~~LOC~~ PEN: 2.0000 mg | PEN_INJECTOR | SUBCUTANEOUS | 1 refills | Status: DC

## 2019-05-08 NOTE — Telephone Encounter (Signed)
We can try Bydureon 2 mg weekly.

## 2019-05-08 NOTE — Telephone Encounter (Signed)
Medicaid has denied PA for Ozempic.  Approved formularies are Victoza, Bydurian, Byetta. These will still need PA submitted.  Please advise.

## 2019-05-08 NOTE — Telephone Encounter (Signed)
New RX sent

## 2019-07-05 ENCOUNTER — Telehealth: Payer: Self-pay

## 2019-07-05 NOTE — Telephone Encounter (Signed)
Patient calling today stating the PA for Ozempic was approved last year and she has been using this since then patient  needs a new PA  done so that she can get the medication-please send this to [facility] stating this is a new year and that is why it needs to be done

## 2019-07-05 NOTE — Telephone Encounter (Signed)
The PA we did Dec 2020 was Denied.  I sent in a PA on Jun 21 2019 to [facility] and they have not sent an answer yet but I suspect it will once again be denied. I am still waiting on insurance.

## 2019-07-07 NOTE — Telephone Encounter (Signed)
Pt called to speak with Medicaid, the ozempic was approved but it shows in Medicaid system that ozempic was only approved for 05/08/2019 to 05/09/2019.   Pt is wanting to see if we have received approval from the 06/21/19 submission for a PA for the Ozempic. She has been off of Ozempic 2 wks now and wants to stay on it as she feels like it works well

## 2019-07-07 NOTE — Telephone Encounter (Addendum)
Medicaid denied the request in Dec and again this month because the patient has not tried and failed TWO of the preferred medications:  Approved formularies are Victoza, Jarvis Morgan, Byetta  The denial letter from Dec is already scanned in her chart.

## 2019-07-10 NOTE — Telephone Encounter (Signed)
Contacted patient to discuss situation regarding Ozempic. I let patient know we sent a PA early Dec 2020 which was denied and the denial letter has been scanned into her chart.  Patient has stated she called them in Dec 2020 and said it was approved for only one day. 05/09/2019.   I spoke to [facility] today to follow up on a new Ozempic PA I faxed at the begining of this month, they stated to me they never received it so I did it today over the phone and will check back in 24 hours for determination. I also inquired about the one day approval in Dec 2020 and they said they do not have any record of that.  Patient is doing very well on Ozempic, sugar numbers decreased and when we was not able to refill her numbers have gone up a lot.  I let her know I will continue working on getting this approved but in the meantime she can come by the clinic today to pick up 2 sample pens to hold her over until I can get it approved.

## 2019-07-18 ENCOUNTER — Telehealth: Payer: Self-pay

## 2019-07-18 NOTE — Telephone Encounter (Signed)
PA for Ozempic has been denied again because she has not tried and failed TWO alternatives:  Victoza, Bydurian, Byetta

## 2019-07-18 NOTE — Telephone Encounter (Signed)
Please give Bydureon another try.  We can resend it to the pharmacy.

## 2019-07-18 NOTE — Telephone Encounter (Signed)
Once again Ozempic has been denied because patient has not tried and failed at least two alternatives:  Victoza, Bydurian, Byetta.  We sent Bydurian in December but patient did not pick it up.  Please advise.

## 2019-07-19 NOTE — Telephone Encounter (Signed)
Notified patient, she has enough Ozempic to last a while and she will call when getting close to running out to try one of the others.

## 2019-07-21 ENCOUNTER — Other Ambulatory Visit: Payer: Self-pay

## 2019-07-25 ENCOUNTER — Encounter: Payer: Self-pay | Admitting: Internal Medicine

## 2019-07-25 ENCOUNTER — Other Ambulatory Visit: Payer: Self-pay

## 2019-07-25 ENCOUNTER — Ambulatory Visit: Payer: Medicaid Other | Admitting: Internal Medicine

## 2019-07-25 VITALS — BP 120/60 | HR 115 | Ht 68.0 in | Wt 317.0 lb

## 2019-07-25 DIAGNOSIS — E559 Vitamin D deficiency, unspecified: Secondary | ICD-10-CM

## 2019-07-25 DIAGNOSIS — E782 Mixed hyperlipidemia: Secondary | ICD-10-CM | POA: Diagnosis not present

## 2019-07-25 DIAGNOSIS — E1165 Type 2 diabetes mellitus with hyperglycemia: Secondary | ICD-10-CM

## 2019-07-25 LAB — POCT GLYCOSYLATED HEMOGLOBIN (HGB A1C): Hemoglobin A1C: 7.4 % — AB (ref 4.0–5.6)

## 2019-07-25 NOTE — Patient Instructions (Signed)
Please continue: - Metformin 1000 mg 2x a day with meals - Glipizide XL 5 mg 2x a day 15-30 min before meals - Ozempic 0.5 mg weekly in a.m.   Please return in 3 months with your sugar log.

## 2019-07-25 NOTE — Progress Notes (Addendum)
Patient ID: Alice Simpson, female   DOB: 1966/01/26, 54 y.o.   MRN: [id number]   This visit occurred during the SARS-CoV-2 public health emergency.  Safety protocols were in place, including screening questions prior to the visit, additional usage of staff PPE, and extensive cleaning of exam room while observing appropriate contact time as indicated for disinfecting solutions.   HPI: Alice Simpson is a 54 y.o.-year-old female, returning for follow-up for DM2, dx in 2007, non-insulin-dependent, uncontrolled, without long-term complications.  Last visit was 04/2019.  I previously saw patient for the same problem with the last visit in 09/2014.  At that time, HbA1c was 6.9%.  She had 3 steroid inj's since last OV.  Sugars were higher for 24 hours afterwards, even in the 400s.  She had gastric sleeve surgery 09/2014 and she did well afterwards, losing weight and improving her diabetes.  However, she lost insurance afterwards and her sugars worsened and she also gained back weight.  At last visit we changed her regimen and sugars started to improve again.  Reviewed HbA1c levels: Lab Results  Component Value Date   HGBA1C 8.6 (A) 04/25/2019   HGBA1C 6.9 (H) 09/24/2014   HGBA1C 6.9 (H) 09/21/2014   HGBA1C 7.5 (H) 02/22/2014   HGBA1C 7.6 (H) 11/02/2013   HGBA1C 7.7 (H) 06/09/2013  03/20/2019: HbA1c 9.2% 11/16/2018: HbA1c 8.3% 08/09/2018: HbA1c 7.4%  Pt is on a regimen of: - Metformin ER 1500 mg with dinner >> 1000 mg 2x a day with meals - Glipizide XL 5 mg before breakfast >> 5 mg 2x a day with meals - Ozempic 0.5 mg weekly  We had to stop Jardiance due to enuresis and GERD.  Pt checks her sugars 1-2x a day: - am: 200-240 >> 121-152 - 2h after b'fast: n/c - before lunch: n/c - 2h after lunch: <180 - before dinner: n/c - 2h after dinner: <180 - bedtime: n/c - nighttime: n/c Lowest sugar was 132 >> 121; she has hypoglycemia awareness at 90 Highest sugar was 299 >> 450  (steroid inj).  Glucometer: AccuChek x2  Pt's meals are: - Breakfast: bagel, cereal, fruit - Lunch: sandwich - Dinner: chicken, veggies She continues to see nutrition. She has a history of binge eating disorder.  -No CKD, last BUN/creatinine:  11/16/2018: 13/0.64, GFR 97, glucose 206 08/09/2018: 11/0.67, GFR 92, glucose 122 Lab Results  Component Value Date   BUN 8 07/06/2018   BUN 8 12/16/2017   CREATININE 0.69 07/06/2018   CREATININE 0.70 12/16/2017  01/13/2018: ACR 11.2 10/13/2017: ACR 19.8 On Cozaar 50.  -+ HL; last set of lipids: 08/09/2018: 224/192/68/118 08/08/2013: 185/151/69/86 No results found for: CHOL, HDL, LDLCALC, LDLDIRECT, TRIG, CHOLHDL On Crestor 20.  - last eye exam was in 12/2018: No DR.she does have macular edema. She is seeing Dr. Prudencio Burly.  -No numbness and tingling in her feet.  Pt has FH of DM in M, F, other siblings.  She also has a history of breast nodules and had radioactive seed implant.  The nodules were not cancerous.  She was diagnosed with PCOS in 2007.  She was on Ortho-micronor (norethindrone) and has withdrawal bleedings every month.  Tried another OCP >> did not feel well on it.  She continues on a progesterone OCP.  She tried to stop it but she had mood changes.  She has a history of hip replacement in 2016  03/2019: TSH reportedly normal  ROS: Constitutional: no weight gain/no weight loss, no fatigue, no subjective  hyperthermia, no subjective hypothermia Eyes: no blurry vision, no xerophthalmia ENT: no sore throat, no nodules palpated in neck, no dysphagia, no odynophagia, no hoarseness Cardiovascular: no CP/no SOB/no palpitations/no leg swelling Respiratory: no cough/no SOB/no wheezing Gastrointestinal: no N/no V/no D/no C/no acid reflux Musculoskeletal: no muscle aches/no joint aches Skin: no rashes, no hair loss Neurological: no tremors/no numbness/no tingling/no dizziness  I reviewed pt's medications, allergies, PMH, social hx,  family hx, and changes were documented in the history of present illness. Otherwise, unchanged from my initial visit note.  Past Medical History:  Diagnosis Date  . ADD (attention deficit disorder)   . Anxiety   . Arthritis   . Depression   . Diabetes mellitus without complication ([place])   . Hyperlipidemia   . Migraine   . Neuropathy   . Neuropathy   . Osteoarthritis   . PCOS (polycystic ovarian syndrome)   . Pulsatile tinnitus 07/06/2018  . Vertigo    Past Surgical History:  Procedure Laterality Date  . BUNIONECTOMY    . CESAREAN SECTION    . LAPAROSCOPIC GASTRIC SLEEVE RESECTION N/A 09/24/2014   Procedure: LAPAROSCOPIC GASTRIC SLEEVE RESECTION upper endoscopy;  Surgeon: Johnathan Hausen, MD;  Location: [facility];  Service: General;  Laterality: N/A;  . RADIOACTIVE SEED GUIDED EXCISIONAL BREAST BIOPSY Left 03/31/2019   Procedure: RADIOACTIVE SEED GUIDED EXCISIONAL LEFT BREAST BIOPSY X2;  Surgeon: Johnathan Hausen, MD;  Location: [facility];  Service: General;  Laterality: Left;  . TOTAL HIP ARTHROPLASTY Right 11/20/2014  . TOTAL HIP ARTHROPLASTY Right 11/20/2014   Procedure: TOTAL HIP ARTHROPLASTY ANTERIOR APPROACH;  Surgeon: Melrose Nakayama, MD;  Location: [facility];  Service: Orthopedics;  Laterality: Right;  . wisdom    . WISDOM TOOTH EXTRACTION     Social History   Socioeconomic History  . Marital status: Single    Spouse name: Not on file  . Number of children: 2  . Years of education: Not on file  . Highest education level: Some college, no degree  Occupational History  . Occupation: Disability  Social Needs  . Financial resource strain: Not on file  . Food insecurity    Worry: Not on file    Inability: Not on file  . Transportation needs    Medical: Not on file    Non-medical: Not on file  Tobacco Use  . Smoking status: Current Some Day Smoker, 1 pack per month    Packs/day:     Years:     Pack years:     Types: Cigarettes    Last attempt to quit:  05/18/2004    Years since quitting: 14.9  . Smokeless tobacco: Never Used  Substance and Sexual Activity  . Alcohol use: Yes    Comment: rarely when does is liquor   . Drug use: Yes    Types: Marijuana    Comment: 1/ every 3 months   . Sexual activity: Not on file  Lifestyle  . Physical activity    Days per week: Not on file    Minutes per session: Not on file  . Stress: Not on file  Relationships  . Social Herbalist on phone: Not on file    Gets together: Not on file    Attends religious service: Not on file    Active member of club or organization: Not on file    Attends meetings of clubs or organizations: Not on file    Relationship status: Not on file  . Intimate  partner violence    Fear of current or ex partner: Not on file    Emotionally abused: Not on file    Physically abused: Not on file    Forced sexual activity: Not on file  Other Topics Concern  . Not on file  Social History Narrative   Left handed   2 cups of caffeine daily    Lives at home alone    Current Outpatient Medications on File Prior to Visit  Medication Sig Dispense Refill  . busPIRone (BUSPAR) 10 MG tablet Take 10 mg by mouth 2 (two) times daily.    . Calcium Citrate-Vitamin D (CALCIUM CITRATE + PO) Take 600 mg by mouth 3 (three) times daily.    . DULoxetine (CYMBALTA) 30 MG capsule Take 40 mg by mouth daily.     . Exenatide ER (BYDUREON) 2 MG PEN Inject 2 mg into the skin once a week. 4 each 1  . glipiZIDE (GLUCOTROL XL) 5 MG 24 hr tablet Take 1 tablet (5 mg total) by mouth 2 (two) times daily before a meal. with food 180 tablet 3  . HYDROcodone-acetaminophen (NORCO/VICODIN) 5-325 MG tablet Take 1 tablet by mouth every 6 (six) hours as needed for moderate pain. 15 tablet 0  . lisdexamfetamine (VYVANSE) 10 MG capsule Take 10 mg by mouth daily. 1-2 tablets in the am    . LORazepam (ATIVAN) 0.5 MG tablet Take 0.5 mg by mouth every 8 (eight) hours.    Marland Kitchen losartan (COZAAR) 25 MG tablet Take 25  mg by mouth daily.    . metFORMIN (GLUCOPHAGE-XR) 500 MG 24 hr tablet Take 2 tablets (1,000 mg total) by mouth 2 (two) times daily. 360 tablet 3  . Multiple Vitamin (MULTI-VITAMINS) TABS Take 1 tablet by mouth 2 (two) times daily.     . norethindrone (MICRONOR,CAMILA,ERRIN) 0.35 MG tablet Take 1 tablet (0.35 mg total) by mouth daily. (Patient taking differently: Take 1 tablet by mouth at bedtime. ) 1 Package 11  . ondansetron (ZOFRAN) 4 MG tablet Take 1 tablet (4 mg total) by mouth every 6 (six) hours. 12 tablet 0  . rosuvastatin (CRESTOR) 10 MG tablet Take 20 mg by mouth daily.     . Vitamin D, Ergocalciferol, (DRISDOL) 1.25 MG (50000 UT) CAPS capsule Take 50,000 Units by mouth once a week.     No current facility-administered medications on file prior to visit.   Allergies  Allergen Reactions  . Lactose Intolerance (Gi)   . Penicillin G Hives    hives  . Prochlorperazine Itching and Anxiety   Family History  Problem Relation Age of Onset  . Diabetes Other   . Depression Other   . Anxiety disorder Other   . High blood pressure Mother   . High Cholesterol Mother   . Diabetes Mother   . Diabetes Father   . High blood pressure Father   . High Cholesterol Father   . Breast cancer Neg Hx     PE: BP 120/60   Pulse (!) 115   Ht 5\' 8"  (1.727 m)   Wt (!) 317 lb (143.8 kg)   SpO2 96%   BMI 48.20 kg/m  Wt Readings from Last 3 Encounters:  07/25/19 (!) 317 lb (143.8 kg)  04/25/19 (!) 320 lb (145.2 kg)  03/31/19 (!) 321 lb 14 oz (146 kg)   Constitutional: overweight, in NAD Eyes: PERRLA, EOMI, no exophthalmos ENT: moist mucous membranes, no thyromegaly, no cervical lymphadenopathy Cardiovascular: Tachycardia RR, No MRG Respiratory: CTA B  Gastrointestinal: abdomen soft, NT, ND, BS+ Musculoskeletal: no deformities, strength intact in all 4 Skin: moist, warm, no rashes Neurological: no tremor with outstretched hands, DTR normal in all 4  ASSESSMENT: 1. DM2,  non-insulin-dependent, uncontrolled, without long-term complications but with hyperglycemia  She does not have a family history of medullary thyroid cancer or personal history of pancreatitis.   2. HL  3.  Obesity class III  PLAN:  1. Patient with longstanding, uncontrolled, type 2 diabetes, on oral antidiabetic regimen and now also GLP-1 receptor agonist.  Latest HbA1c obtained at last visit was higher, at 8.6%.  At that time, I recommended to add Ozempic.   -She has a history of gastric bypass surgery in 2016 after which her sugars improved significantly, but she was not able to follow-up due to loss of insurance.  She then had a hip replacement surgery and started to gain weight afterwards.  Her diabetes worsened again.  This became even worse in 07/2018 after a flulike illness.  Jardiance was added 11/2018 but she had increased GERD and enuresis afterwards.  We stopped this at last visit.  Her Metformin dose was also increased with only occasional diarrhea.  At last visit, we increased the dose further to 1000 mg twice a day.  I also increased her glipizide XL at that time to twice a day.  She was not checking frequently due to anxiety caused by high blood sugars.  I strongly advised her to start checking both in the morning and later in the day, rotating check times. -At this visit, her sugars are much better, despite being off Ozempic for a short period of time due to problems with her insurance.  This was initially approved but only for 1 day (!).  She obtained several times from the pharmacy but ran out.  She now has samples of Ozempic but is planning to call Medicaid and try to get them to cover it again.  She had abdominal pain and nausea after starting it, however, this resolved. -She also continues on Metformin and glipizide twice a day. - I suggested to:  Patient Instructions  Please continue: - Metformin 1000 mg 2x a day with meals - Glipizide XL 5 mg 2x a day 15-30 min before meals -  Ozempic 0.5 mg weekly in a.m.   Please return in 3 months with your sugar log.   - we checked her HbA1c: 7.4% (lower) - advised to check sugars at different times of the day - 2x a day, rotating check times - advised for yearly eye exams >> she is UTD - return to clinic in 3 months  2. HL - Reviewed latest lipid panel: 08/09/2018: 224/192/68/118 -LDL and triglycerides above target - Continues Crestor without side effects.  3.  Obesity class III -Continue GLP-1 receptor agonist which should also help with weight loss -Lost 3 pounds since last visit.  Component     Latest Ref Rng & Units 07/25/2019  Cholesterol     0 - 200 mg/dL 220 (H)  Triglycerides     0.0 - 149.0 mg/dL 306.0 (H)  HDL Cholesterol     >39.00 mg/dL 58.40  VLDL     0.0 - 40.0 mg/dL 61.2 (H)  Total CHOL/HDL Ratio      4  NonHDL      162.02  VITD     30.00 - 100.00 ng/mL 40.99  Direct LDL     mg/dL 132.0   Triglycerides and LDL higher than before.  I  will check with her if she is taking Crestor consistently.  We will also discuss about improving diet.  Philemon Kingdom, MD PhD [facility] Endocrinology

## 2019-07-26 LAB — LDL CHOLESTEROL, DIRECT: Direct LDL: 132 mg/dL

## 2019-07-26 LAB — VITAMIN D 25 HYDROXY (VIT D DEFICIENCY, FRACTURES): VITD: 40.99 ng/mL (ref 30.00–100.00)

## 2019-07-26 LAB — LIPID PANEL
Cholesterol: 220 mg/dL — ABNORMAL HIGH (ref 0–200)
HDL: 58.4 mg/dL (ref >39.00)
NonHDL: 162.02
Total CHOL/HDL Ratio: 4
Triglycerides: 306 mg/dL — ABNORMAL HIGH (ref 0.0–149.0)
VLDL: 61.2 mg/dL — ABNORMAL HIGH (ref 0.0–40.0)

## 2019-10-26 ENCOUNTER — Ambulatory Visit: Payer: Medicaid Other | Admitting: Internal Medicine

## 2019-11-07 ENCOUNTER — Telehealth: Payer: Self-pay

## 2019-11-07 NOTE — Telephone Encounter (Signed)
Patient calling to let Dr. [name] know she has some steroid injections coming up end of this month and early next month and does not recall what instructions she was given for her increased CBG.

## 2019-11-08 ENCOUNTER — Encounter: Payer: Self-pay | Admitting: Internal Medicine

## 2019-11-10 NOTE — Telephone Encounter (Signed)
See MyChart message

## 2019-12-01 ENCOUNTER — Other Ambulatory Visit: Payer: Self-pay | Admitting: Surgery

## 2019-12-01 DIAGNOSIS — D242 Benign neoplasm of left breast: Secondary | ICD-10-CM

## 2020-01-01 ENCOUNTER — Other Ambulatory Visit: Payer: Self-pay

## 2020-01-01 MED ORDER — OZEMPIC (0.25 OR 0.5 MG/DOSE) 2 MG/1.5ML ~~LOC~~ SOPN: 0.5000 mg | PEN_INJECTOR | SUBCUTANEOUS | 4 refills | Status: DC

## 2020-01-05 ENCOUNTER — Telehealth: Payer: Self-pay

## 2020-01-05 NOTE — Telephone Encounter (Signed)
PA for Ozempic has been denied.  

## 2020-01-23 ENCOUNTER — Other Ambulatory Visit: Payer: Self-pay

## 2020-01-23 ENCOUNTER — Ambulatory Visit
Admission: RE | Admit: 2020-01-23 | Discharge: 2020-01-23 | Disposition: A | Discharge status: Home / Self Care | Payer: Medicaid Other | Source: Ambulatory Visit | Attending: Surgery | Admitting: Surgery

## 2020-01-23 DIAGNOSIS — D242 Benign neoplasm of left breast: Secondary | ICD-10-CM

## 2020-01-29 ENCOUNTER — Ambulatory Visit (INDEPENDENT_AMBULATORY_CARE_PROVIDER_SITE_OTHER): Payer: Medicaid Other | Admitting: Internal Medicine

## 2020-01-29 ENCOUNTER — Encounter: Payer: Self-pay | Admitting: Internal Medicine

## 2020-01-29 ENCOUNTER — Other Ambulatory Visit: Payer: Self-pay

## 2020-01-29 VITALS — BP 130/90 | HR 120 | Ht 68.0 in | Wt 309.0 lb

## 2020-01-29 DIAGNOSIS — E782 Mixed hyperlipidemia: Secondary | ICD-10-CM

## 2020-01-29 DIAGNOSIS — E1165 Type 2 diabetes mellitus with hyperglycemia: Secondary | ICD-10-CM | POA: Diagnosis not present

## 2020-01-29 LAB — POCT GLYCOSYLATED HEMOGLOBIN (HGB A1C): Hemoglobin A1C: 6.5 % — AB (ref 4.0–5.6)

## 2020-01-29 MED ORDER — METFORMIN HCL ER 500 MG PO TB24
1000.0000 mg | ORAL_TABLET | Freq: Two times a day (BID) | ORAL | 3 refills | Status: DC
Start: 2020-01-29 — End: 2020-12-10

## 2020-01-29 MED ORDER — GLIPIZIDE ER 5 MG PO TB24
5.0000 mg | ORAL_TABLET | Freq: Two times a day (BID) | ORAL | 3 refills | Status: DC
Start: 2020-01-29 — End: 2020-12-10

## 2020-01-29 MED ORDER — OZEMPIC (0.25 OR 0.5 MG/DOSE) 2 MG/1.5ML ~~LOC~~ SOPN: 0.5000 mg | PEN_INJECTOR | SUBCUTANEOUS | 3 refills | Status: DC

## 2020-01-29 NOTE — Progress Notes (Signed)
Patient ID: Alice Simpson Alice Simpson, female   DOB: [birth year], 54 y.o.   MRN: [id number]   This visit occurred during the SARS-CoV-2 public health emergency.  Safety protocols were in place, including screening questions prior to the visit, additional usage of staff PPE, and extensive cleaning of exam room while observing appropriate contact time as indicated for disinfecting solutions.   HPI: Alice Simpson is a 54 y.o.-year-old female, returning for follow-up for DM2, dx in 2007, non-insulin-dependent, uncontrolled, without long-term complications.  Last visit 6 months ago.  Before last visit she had 3 steroid injections.  Sugars were high, even in the 400s. She had 2 more inj's in knees in 10/2019 >> sugars 380. She also needs injections in the SI joints.  She is hoping she can get gel injections in her knees from now on.  More  She also had diverticulitis since last OV >> had 2 rounds of ABx.  She also had stress-related alopecia - saw dermatology.  Reviewed HbA1c levels: Lab Results  Component Value Date   HGBA1C 7.4 (A) 07/25/2019   HGBA1C 8.6 (A) 04/25/2019   HGBA1C 6.9 (H) 09/24/2014   HGBA1C 6.9 (H) 09/21/2014   HGBA1C 7.5 (H) 02/22/2014   HGBA1C 7.6 (H) 11/02/2013   HGBA1C 7.7 (H) 06/09/2013  03/20/2019: HbA1c 9.2% 11/16/2018: HbA1c 8.3% 08/09/2018: HbA1c 7.4%  Pt is on a regimen of: - Metformin ER 1500 mg with dinner >> 1000 mg 2x a day with meals - Glipizide XL 5 mg before breakfast >> 5 mg 2x a day with meals - Ozempic 0.5 mg weekly We had to stop Jardiance due to enuresis and GERD.  Pt checks her sugars seldom: - am: 200-240 >> 121-152 >> 115 - 2h after b'fast: n/c >> 143 - before lunch: n/c >> 93 - 2h after lunch: <180 >> n/c - before dinner: n/c - 2h after dinner: <180 >> n/c - bedtime: n/c - nighttime: n/c Lowest sugar was 132 >> 121 >> 90; she has hypoglycemia awareness at 90. Highest sugar was 299 >> 450 (steroid inj) >> 380 (steroid  inj).  Glucometer: AccuChek x2  Pt's meals are: - Breakfast: bagel, cereal, fruit - Lunch: sandwich - Dinner: chicken, veggies She continues to see nutrition. She has a history of binge eating disorder.  -No CKD, last BUN/creatinine:  11/16/2018: 13/0.64, GFR 97, glucose 206 08/09/2018: 11/0.67, GFR 92, glucose 122 Lab Results  Component Value Date   BUN 8 07/06/2018   BUN 8 12/16/2017   CREATININE 0.69 07/06/2018   CREATININE 0.70 12/16/2017  01/13/2018: ACR 11.2 10/13/2017: ACR 19.8 On Cozaar 50.  -+ HL; last set of lipids: Lab Results  Component Value Date   CHOL 220 (H) 07/25/2019   HDL 58.40 07/25/2019   LDLDIRECT 132.0 07/25/2019   TRIG 306.0 (H) 07/25/2019   CHOLHDL 4 07/25/2019  08/09/2018: 224/192/68/118 08/08/2013: 185/151/69/86 On Crestor 20.  - last eye exam was in 01/2020: No DR, + macular edema. Also, reportedly: + vitreous detachment OD and OS. Also, + cataracts. She is seeing Dr. Prudencio Burly.  -She denies numbness and tingling in her feet.  Pt has FH of DM in M, F, other siblings.  She also has a history of breast nodules and had radioactive seed implant.  The nodules were not cancerous.  Patient has a history of PCOS diagnosed in 2007.  She was on Ortho-micronor (norethindrone) and has withdrawal bleedings every month.  Tried another OCP >> did not feel well on it.  She  continues on progesterone only OCP.  She tried to stop it but she had mood changes.  She had gastric sleeve surgery 09/2014 and she did well afterwards, losing weight and improving her diabetes.  However, she lost insurance afterwards, and she started to gain weight back.  Sugars increased again.  She has a history of hip replacement in 2016.  03/2019: TSH reportedly normal  ROS: Constitutional: no weight gain/+ weight loss, no fatigue, no subjective hyperthermia, no subjective hypothermia Eyes: no blurry vision, no xerophthalmia ENT: no sore throat, no nodules palpated in neck, no dysphagia,  no odynophagia, no hoarseness Cardiovascular: no CP/no SOB/no palpitations/no leg swelling Respiratory: no cough/no SOB/no wheezing Gastrointestinal: no N/no V/no D/no C/no acid reflux Musculoskeletal: no muscle aches/no joint aches Skin: no rashes, no hair loss Neurological: no tremors/no numbness/no tingling/no dizziness  I reviewed pt's medications, allergies, PMH, social hx, family hx, and changes were documented in the history of present illness. Otherwise, unchanged from my initial visit note.  Past Medical History:  Diagnosis Date  . ADD (attention deficit disorder)   . Anxiety   . Arthritis   . Depression   . Diabetes mellitus without complication ([place])   . Hyperlipidemia   . Migraine   . Neuropathy   . Neuropathy   . Osteoarthritis   . PCOS (polycystic ovarian syndrome)   . Pulsatile tinnitus 07/06/2018  . Vertigo    Past Surgical History:  Procedure Laterality Date  . BUNIONECTOMY    . CESAREAN SECTION    . LAPAROSCOPIC GASTRIC SLEEVE RESECTION N/A 09/24/2014   Procedure: LAPAROSCOPIC GASTRIC SLEEVE RESECTION upper endoscopy;  Surgeon: Johnathan Hausen, MD;  Location: [facility];  Service: General;  Laterality: N/A;  . RADIOACTIVE SEED GUIDED EXCISIONAL BREAST BIOPSY Left 03/31/2019   Procedure: RADIOACTIVE SEED GUIDED EXCISIONAL LEFT BREAST BIOPSY X2;  Surgeon: Johnathan Hausen, MD;  Location: [facility];  Service: General;  Laterality: Left;  . TOTAL HIP ARTHROPLASTY Right 11/20/2014  . TOTAL HIP ARTHROPLASTY Right 11/20/2014   Procedure: TOTAL HIP ARTHROPLASTY ANTERIOR APPROACH;  Surgeon: Melrose Nakayama, MD;  Location: [facility];  Service: Orthopedics;  Laterality: Right;  . wisdom    . WISDOM TOOTH EXTRACTION     Social History   Socioeconomic History  . Marital status: Single    Spouse name: Not on file  . Number of children: 2  . Years of education: Not on file  . Highest education level: Some college, no degree  Occupational History  . Occupation:  Disability  Social Needs  . Financial resource strain: Not on file  . Food insecurity    Worry: Not on file    Inability: Not on file  . Transportation needs    Medical: Not on file    Non-medical: Not on file  Tobacco Use  . Smoking status: Current Some Day Smoker, 1 pack per month    Packs/day:     Years:     Pack years:     Types: Cigarettes    Last attempt to quit: 05/18/2004    Years since quitting: 14.9  . Smokeless tobacco: Never Used  Substance and Sexual Activity  . Alcohol use: Yes    Comment: rarely when does is liquor   . Drug use: Yes    Types: Marijuana    Comment: 1/ every 3 months   . Sexual activity: Not on file  Lifestyle  . Physical activity    Days per week: Not on file    Minutes  per session: Not on file  . Stress: Not on file  Relationships  . Social Herbalist on phone: Not on file    Gets together: Not on file    Attends religious service: Not on file    Active member of club or organization: Not on file    Attends meetings of clubs or organizations: Not on file    Relationship status: Not on file  . Intimate partner violence    Fear of current or ex partner: Not on file    Emotionally abused: Not on file    Physically abused: Not on file    Forced sexual activity: Not on file  Other Topics Concern  . Not on file  Social History Narrative   Left handed   2 cups of caffeine daily    Lives at home alone    Current Outpatient Medications on File Prior to Visit  Medication Sig Dispense Refill  . busPIRone (BUSPAR) 10 MG tablet Take 10 mg by mouth 2 (two) times daily.    . Calcium Citrate-Vitamin D (CALCIUM CITRATE + PO) Take 600 mg by mouth 3 (three) times daily.    . DULoxetine (CYMBALTA) 30 MG capsule Take 40 mg by mouth daily.     . Exenatide ER (BYDUREON) 2 MG PEN Inject 2 mg into the skin once a week. (Patient not taking: Reported on 07/25/2019) 4 each 1  . glipiZIDE (GLUCOTROL XL) 5 MG 24 hr tablet Take 1 tablet (5 mg total) by  mouth 2 (two) times daily before a meal. with food 180 tablet 3  . HYDROcodone-acetaminophen (NORCO/VICODIN) 5-325 MG tablet Take 1 tablet by mouth every 6 (six) hours as needed for moderate pain. 15 tablet 0  . lisdexamfetamine (VYVANSE) 10 MG capsule Take 10 mg by mouth daily. 1-2 tablets in the am    . LORazepam (ATIVAN) 0.5 MG tablet Take 0.5 mg by mouth every 8 (eight) hours.    Marland Kitchen losartan (COZAAR) 25 MG tablet Take 25 mg by mouth daily.    . metFORMIN (GLUCOPHAGE-XR) 500 MG 24 hr tablet Take 2 tablets (1,000 mg total) by mouth 2 (two) times daily. 360 tablet 3  . Multiple Vitamin (MULTI-VITAMINS) TABS Take 1 tablet by mouth 2 (two) times daily.     . norethindrone (MICRONOR,CAMILA,ERRIN) 0.35 MG tablet Take 1 tablet (0.35 mg total) by mouth daily. (Patient taking differently: Take 1 tablet by mouth at bedtime. ) 1 Package 11  . ondansetron (ZOFRAN) 4 MG tablet Take 1 tablet (4 mg total) by mouth every 6 (six) hours. 12 tablet 0  . rosuvastatin (CRESTOR) 10 MG tablet Take 20 mg by mouth daily.     . Semaglutide,0.25 or 0.5MG /DOS, (OZEMPIC, 0.25 OR 0.5 MG/DOSE,) 2 MG/1.5ML SOPN Inject 0.375 mLs (0.5 mg total) into the skin once a week. 1.5 mL 4  . Vitamin D, Ergocalciferol, (DRISDOL) 1.25 MG (50000 UT) CAPS capsule Take 50,000 Units by mouth once a week.     No current facility-administered medications on file prior to visit.   Allergies  Allergen Reactions  . Lactose Intolerance (Gi)   . Penicillin G Hives    hives  . Prochlorperazine Itching and Anxiety   Family History  Problem Relation Age of Onset  . Diabetes Other   . Depression Other   . Anxiety disorder Other   . High blood pressure Mother   . High Cholesterol Mother   . Diabetes Mother   . Diabetes Father   .  High blood pressure Father   . High Cholesterol Father   . Breast cancer Neg Hx     PE: BP 130/90   Pulse (!) 120   Ht 5\' 8"  (1.727 m)   Wt (!) 309 lb (140.2 kg)   SpO2 97%   BMI 46.98 kg/m  Wt Readings  from Last 3 Encounters:  01/29/20 (!) 309 lb (140.2 kg)  07/25/19 (!) 317 lb (143.8 kg)  04/25/19 (!) 320 lb (145.2 kg)   Constitutional: overweight, in NAD Eyes: PERRLA, EOMI, no exophthalmos ENT: moist mucous membranes, no thyromegaly, no cervical lymphadenopathy Cardiovascular: tachycardia, RR, No MRG Respiratory: CTA B Gastrointestinal: abdomen soft, NT, ND, BS+ Musculoskeletal: no deformities, strength intact in all 4 Skin: moist, warm, no rashes Neurological: no tremor with outstretched hands, DTR normal in all 4  ASSESSMENT: 1. DM2, non-insulin-dependent, uncontrolled, without long-term complications but with hyperglycemia  She does not have a family history of medullary thyroid cancer or personal history of pancreatitis.   2. HL  3.  Obesity class III  PLAN:  1. Patient with longstanding, uncontrolled, type 2 diabetes, on oral antidiabetic regimen and now also GLP-1 receptor agonist.  At last visit HbA1c was better, at 7.4%, after addition of Ozempic at the previous visit. -She has a history of gastric bypass surgery in 2016, after which her sugars improved significantly, but she was not able to follow-up due to loss of insurance.  She had a hip replacement surgery and started to gain weight afterwards.  Her diabetes worsened again.  This became even worse in 07/2018 after a flulike illness.  Jardiance was added 11/2018 but she had increased GERD and enuresis afterwards >> Jardiance stopped.  Metformin gives her occasional diarrhea but is overall tolerated.  At last visit she was not checking frequently due to anxiety caused by high blood sugars.  I strongly advised her to start checking both in the morning and later in the day, rotating check times.  At last visit, sugars are much better despite being off Ozempic for a short period of time due to problems with insurance.  She was able to restart it afterwards. -At today's visit, she tells me that she is not checking sugars  consistently.  She checked over the last weekend and the sugars were all at goal. - we checked her HbA1c: 6.5% (better)  -At this visit, we discussed about checking sugars consistently, but there is no need to change her regimen.  She is tolerating the Ozempic well, without side effects. -Refilled all prescriptions today - I suggested to:  Patient Instructions  Please continue: - Metformin 1000 mg 2x a day with meals - Glipizide XL 5 mg 2x a day 15-30 min before meals - Ozempic 0.5 mg weekly in a.m.   Please return in 3-4 months with your sugar log.   - advised to check sugars at different times of the day - 1x a day, rotating check times - advised for yearly eye exams >> she is UTD - return to clinic in 3-4 months  2. HL -Reviewed latest lipid panel from 07/2018: LDL above goal, as are her triglycerides -Continues Crestor without side effects.  3.  Obesity class III -Continue GLP-1 receptor agonist, which should also help with weight loss -She lost 3 pounds before last visit and 8 lbs since last OV  Philemon Kingdom, MD PhD [facility] Endocrinology

## 2020-01-29 NOTE — Patient Instructions (Addendum)
Please continue: - Metformin 1000 mg 2x a day with meals - Glipizide XL 5 mg 2x a day 15-30 min before meals - Ozempic 0.5 mg weekly in a.m.   Please return in 3-4 months with your sugar log.

## 2020-03-06 ENCOUNTER — Other Ambulatory Visit: Payer: Self-pay | Admitting: Internal Medicine

## 2020-03-06 MED ORDER — OZEMPIC (0.25 OR 0.5 MG/DOSE) 2 MG/1.5ML ~~LOC~~ SOPN
0.5000 mg | PEN_INJECTOR | SUBCUTANEOUS | 3 refills | Status: DC
Start: 2020-03-06 — End: 2020-03-08

## 2020-03-08 ENCOUNTER — Other Ambulatory Visit: Payer: Self-pay

## 2020-03-08 MED ORDER — OZEMPIC (0.25 OR 0.5 MG/DOSE) 2 MG/1.5ML ~~LOC~~ SOPN: 0.5000 mg | PEN_INJECTOR | SUBCUTANEOUS | 3 refills | Status: DC

## 2020-05-22 ENCOUNTER — Telehealth: Payer: Self-pay | Admitting: Internal Medicine

## 2020-05-22 MED ORDER — BYDUREON BCISE 2 MG/0.85ML ~~LOC~~ AUIJ
2.0000 mg | AUTO-INJECTOR | SUBCUTANEOUS | 11 refills | Status: DC
Start: 2020-05-22 — End: 2020-05-28

## 2020-05-22 NOTE — Telephone Encounter (Signed)
Please see below.

## 2020-05-22 NOTE — Telephone Encounter (Signed)
Patient states her insurance won't cover the Ozempic if its called in as that, but if Dr Elvera Lennox is able to call it in as [place] they are able to fill it at the pharmacy? Please advise.  Pharmacy -  [facility] - [place], [place] - [address]  [address], [place] [address]  Phone:  [phone] Fax:  [phone]

## 2020-05-22 NOTE — Telephone Encounter (Signed)
T, I sent Bydureon for 1 mo with 11 refills. C

## 2020-05-22 NOTE — Telephone Encounter (Signed)
Called and advised patient Rx was sent to pharmacy. They will contact when Rx is ready for pick up. Pt verbalized understanding.

## 2020-05-24 ENCOUNTER — Telehealth: Payer: Self-pay | Admitting: Internal Medicine

## 2020-05-24 NOTE — Telephone Encounter (Signed)
Patient has questions about Ozempic and Bydureon -regarding approvals and fills with pharmacy and insurance  Phone # [phone]

## 2020-05-24 NOTE — Telephone Encounter (Signed)
Called and spoke with pt. Prior Josem Kaufmann is required for Bydureon. Advised pt we submitted request and are awaiting a determination. Pt has appt 05/28/20 to follow up.

## 2020-05-28 ENCOUNTER — Encounter: Payer: Self-pay | Admitting: Internal Medicine

## 2020-05-28 ENCOUNTER — Ambulatory Visit (INDEPENDENT_AMBULATORY_CARE_PROVIDER_SITE_OTHER): Payer: Medicaid Other | Admitting: Internal Medicine

## 2020-05-28 ENCOUNTER — Other Ambulatory Visit: Payer: Self-pay

## 2020-05-28 VITALS — BP 138/80 | HR 91 | Ht 68.0 in | Wt 309.2 lb

## 2020-05-28 DIAGNOSIS — E1165 Type 2 diabetes mellitus with hyperglycemia: Secondary | ICD-10-CM | POA: Diagnosis not present

## 2020-05-28 DIAGNOSIS — E782 Mixed hyperlipidemia: Secondary | ICD-10-CM | POA: Diagnosis not present

## 2020-05-28 LAB — POCT GLYCOSYLATED HEMOGLOBIN (HGB A1C): Hemoglobin A1C: 6.2 % — AB (ref 4.0–5.6)

## 2020-05-28 MED ORDER — EXENATIDE ER 2 MG ~~LOC~~ PEN: PEN_INJECTOR | SUBCUTANEOUS | 3 refills | Status: DC

## 2020-05-28 NOTE — Patient Instructions (Signed)
Please continue: - Metformin 1000 mg 2x a day with meals - Glipizide XL 5 mg 2x a day 15-30 min before meals  Try to start: - Bydureon 2 mg weekly in a.m.   Please return in 3-4 months with your sugar log.

## 2020-05-28 NOTE — Progress Notes (Signed)
Patient ID: Alice Simpson Alice Simpson, female   DOB: 23-Feb-1966, 55 y.o.   MRN: [id number]   This visit occurred during the SARS-CoV-2 public health emergency.  Safety protocols were in place, including screening questions prior to the visit, additional usage of staff PPE, and extensive cleaning of exam room while observing appropriate contact time as indicated for disinfecting solutions.  She  HPI: [name] is a 55 y.o.-year-old female, returning for follow-up for DM2, dx in 2007, non-insulin-dependent, uncontrolled, without long-term complications.  Last visit 4 months ago.  Before last visit, she had stress-related alopecia, diverticulitis, joint injections.   Since last OV, she had 2 more steroid inj's.  It is unclear whether sugars increased weight-based, as she did not check them - lost meter.  Reviewed HbA1c levels: Lab Results  Component Value Date   HGBA1C 6.5 (A) 01/29/2020   HGBA1C 7.4 (A) 07/25/2019   HGBA1C 8.6 (A) 04/25/2019   HGBA1C 6.9 (H) 09/24/2014   HGBA1C 6.9 (H) 09/21/2014   HGBA1C 7.5 (H) 02/22/2014   HGBA1C 7.6 (H) 11/02/2013   HGBA1C 7.7 (H) 06/09/2013  03/20/2019: HbA1c 9.2% 11/16/2018: HbA1c 8.3% 08/09/2018: HbA1c 7.4%  Pt is on a regimen of: - Metformin ER 1500 mg with dinner >> 1000 mg 2x a day with meals - Glipizide XL 5 mg before breakfast >> 5 mg 2x a day with meals - Ozempic 0.5 mg weekly >> not covered anymore >> sent  Bydureon BCise 2 mg weekly (05/2020) - was not approved... now off Ozempic for 2 weeks We had to stop Jardiance due to enuresis and GERD.  Pt is not checking sugars  - moving, busy, lost meter. In 03/2020, she checked when visiting her mother out of town: Average 120.  From last visit: - am: 200-240 >> 121-152 >> 115  - 2h after b'fast: n/c >> 143 - before lunch: n/c >> 93 - 2h after lunch: <180 >> n/c - before dinner: n/c - 2h after dinner: <180 >> n/c - bedtime: n/c - nighttime: n/c Lowest sugar was 132 >> 121 >> 90  >> 80s; she has hypoglycemia awareness in the 90s. Highest sugar was 450 (steroid inj) >> 380 (steroid inj) >> ?.  Glucometer: AccuChek x2  Pt's meals are: - Breakfast: bagel, cereal, fruit - Lunch: sandwich - Dinner: chicken, veggies She continues to see nutrition. She has a history of binge eating disorder.  -No CKD, last BUN/creatinine:  11/16/2018: 13/0.64, GFR 97, glucose 206 08/09/2018: 11/0.67, GFR 92, glucose 122 Lab Results  Component Value Date   BUN 8 07/06/2018   BUN 8 12/16/2017   CREATININE 0.69 07/06/2018   CREATININE 0.70 12/16/2017  01/13/2018: ACR 11.2 10/13/2017: ACR 19.8 On Cozaar 60.  -+ HL; last set of lipids: Lab Results  Component Value Date   CHOL 220 (H) 07/25/2019   HDL 58.40 07/25/2019   LDLDIRECT 132.0 07/25/2019   TRIG 306.0 (H) 07/25/2019   CHOLHDL 4 07/25/2019  08/09/2018: 224/192/68/118 08/08/2013: 185/151/69/86 On Crestor 20.  - last eye exam was in 01/2020: No DR, + macular edema.  She also has vitreous detachment OU and cataracts. She is seeing Dr. Prudencio Burly.  -No numbness and tingling in her feet.  Pt has FH of DM in M, F, other siblings.  She also has a history of breast nodules and had radioactive seed implant.  The nodules were noncancerous.  She tells me that she now again developed pain in the left breast.  She has an appointment with  OB/GYN soon.  Patient has a history of PCOS diagnosed in 2007.  She was on Ortho-micronor (norethindrone) and has withdrawal bleedings every month.  Tried another OCP >> did not feel well on it.  She continues on progesterone only OCP.  She tried to stop these but she developed mood changes and she restarted.  She had gastric sleeve surgery 09/2014 and she did well afterwards, losing weight and improving her diabetes.  However, she lost insurance afterwards and started to gain the weight back.  She has a history of hip replacement in 2016.  03/2019: TSH reportedly normal  ROS: Constitutional: no weight  gain/no weight loss, no fatigue, no subjective hyperthermia, no subjective hypothermia Eyes: no blurry vision, no xerophthalmia ENT: no sore throat, no nodules palpated in neck, no dysphagia, no odynophagia, no hoarseness Cardiovascular: no CP/no SOB/no palpitations/no leg swelling Respiratory: no cough/no SOB/no wheezing Gastrointestinal: no N/no V/no D/no C/no acid reflux Musculoskeletal: no muscle aches/no joint aches Skin: no rashes, no hair loss Neurological: no tremors/no numbness/no tingling/no dizziness + L breast pain  I reviewed pt's medications, allergies, PMH, social hx, family hx, and changes were documented in the history of present illness. Otherwise, unchanged from my initial visit note.  Past Medical History:  Diagnosis Date  . ADD (attention deficit disorder)   . Anxiety   . Arthritis   . Depression   . Diabetes mellitus without complication ([place])   . Hyperlipidemia   . Migraine   . Neuropathy   . Neuropathy   . Osteoarthritis   . PCOS (polycystic ovarian syndrome)   . Pulsatile tinnitus 07/06/2018  . Vertigo    Past Surgical History:  Procedure Laterality Date  . BUNIONECTOMY    . CESAREAN SECTION    . LAPAROSCOPIC GASTRIC SLEEVE RESECTION N/A 09/24/2014   Procedure: LAPAROSCOPIC GASTRIC SLEEVE RESECTION upper endoscopy;  Surgeon: Johnathan Hausen, MD;  Location: [facility];  Service: General;  Laterality: N/A;  . RADIOACTIVE SEED GUIDED EXCISIONAL BREAST BIOPSY Left 03/31/2019   Procedure: RADIOACTIVE SEED GUIDED EXCISIONAL LEFT BREAST BIOPSY X2;  Surgeon: Johnathan Hausen, MD;  Location: [facility];  Service: General;  Laterality: Left;  . TOTAL HIP ARTHROPLASTY Right 11/20/2014  . TOTAL HIP ARTHROPLASTY Right 11/20/2014   Procedure: TOTAL HIP ARTHROPLASTY ANTERIOR APPROACH;  Surgeon: Melrose Nakayama, MD;  Location: [facility];  Service: Orthopedics;  Laterality: Right;  . wisdom    . WISDOM TOOTH EXTRACTION     Social History   Socioeconomic History  .  Marital status: Single    Spouse name: Not on file  . Number of children: 2  . Years of education: Not on file  . Highest education level: Some college, no degree  Occupational History  . Occupation: Disability  Social Needs  . Financial resource strain: Not on file  . Food insecurity    Worry: Not on file    Inability: Not on file  . Transportation needs    Medical: Not on file    Non-medical: Not on file  Tobacco Use  . Smoking status: Current Some Day Smoker, 1 pack per month    Packs/day:     Years:     Pack years:     Types: Cigarettes    Last attempt to quit: 05/18/2004    Years since quitting: 14.9  . Smokeless tobacco: Never Used  Substance and Sexual Activity  . Alcohol use: Yes    Comment: rarely when does is liquor   . Drug use: Yes  Types: Marijuana    Comment: 1/ every 3 months   . Sexual activity: Not on file  Lifestyle  . Physical activity    Days per week: Not on file    Minutes per session: Not on file  . Stress: Not on file  Relationships  . Social Herbalist on phone: Not on file    Gets together: Not on file    Attends religious service: Not on file    Active member of club or organization: Not on file    Attends meetings of clubs or organizations: Not on file    Relationship status: Not on file  . Intimate partner violence    Fear of current or ex partner: Not on file    Emotionally abused: Not on file    Physically abused: Not on file    Forced sexual activity: Not on file  Other Topics Concern  . Not on file  Social History Narrative   Left handed   2 cups of caffeine daily    Lives at home alone    Current Outpatient Medications on File Prior to Visit  Medication Sig Dispense Refill  . busPIRone (BUSPAR) 10 MG tablet Take 10 mg by mouth 2 (two) times daily.    . Calcium Citrate-Vitamin D (CALCIUM CITRATE + PO) Take 600 mg by mouth 3 (three) times daily.    . DULoxetine (CYMBALTA) 30 MG capsule Take 40 mg by mouth daily.      . Exenatide ER (BYDUREON BCISE) 2 MG/0.85ML AUIJ Inject 2 mg into the skin once a week. 3.4 mL 11  . glipiZIDE (GLUCOTROL XL) 5 MG 24 hr tablet Take 1 tablet (5 mg total) by mouth 2 (two) times daily before a meal. with food 180 tablet 3  . HYDROcodone-acetaminophen (NORCO/VICODIN) 5-325 MG tablet Take 1 tablet by mouth every 6 (six) hours as needed for moderate pain. 15 tablet 0  . lisdexamfetamine (VYVANSE) 10 MG capsule Take 10 mg by mouth daily. 1-2 tablets in the am    . LORazepam (ATIVAN) 0.5 MG tablet Take 0.5 mg by mouth every 8 (eight) hours.    Marland Kitchen losartan (COZAAR) 25 MG tablet Take 25 mg by mouth daily.    . metFORMIN (GLUCOPHAGE-XR) 500 MG 24 hr tablet Take 2 tablets (1,000 mg total) by mouth 2 (two) times daily. 360 tablet 3  . Multiple Vitamin (MULTI-VITAMINS) TABS Take 1 tablet by mouth 2 (two) times daily.     . norethindrone (MICRONOR,CAMILA,ERRIN) 0.35 MG tablet Take 1 tablet (0.35 mg total) by mouth daily. (Patient taking differently: Take 1 tablet by mouth at bedtime. ) 1 Package 11  . ondansetron (ZOFRAN) 4 MG tablet Take 1 tablet (4 mg total) by mouth every 6 (six) hours. 12 tablet 0  . rosuvastatin (CRESTOR) 10 MG tablet Take 20 mg by mouth daily.     . Semaglutide,0.25 or 0.5MG /DOS, (OZEMPIC, 0.25 OR 0.5 MG/DOSE,) 2 MG/1.5ML SOPN Inject 0.5 mg into the skin once a week. 4.5 mL 3  . Vitamin D, Ergocalciferol, (DRISDOL) 1.25 MG (50000 UT) CAPS capsule Take 50,000 Units by mouth once a week.     No current facility-administered medications on file prior to visit.   Allergies  Allergen Reactions  . Lactose Intolerance (Gi)   . Penicillin G Hives    hives  . Prochlorperazine Itching and Anxiety   Family History  Problem Relation Age of Onset  . Diabetes Other   . Depression Other   .  Anxiety disorder Other   . High blood pressure Mother   . High Cholesterol Mother   . Diabetes Mother   . Diabetes Father   . High blood pressure Father   . High Cholesterol Father    . Breast cancer Neg Hx     PE: BP 138/80   Pulse 91   Ht 5\' 8"  (1.727 m)   Wt (!) 309 lb 3.2 oz (140.3 kg)   SpO2 96%   BMI 47.01 kg/m  Wt Readings from Last 3 Encounters:  05/28/20 (!) 309 lb 3.2 oz (140.3 kg)  01/29/20 (!) 309 lb (140.2 kg)  07/25/19 (!) 317 lb (143.8 kg)   Constitutional: overweight, in NAD Eyes: PERRLA, EOMI, no exophthalmos ENT: moist mucous membranes, no thyromegaly, no cervical lymphadenopathy Cardiovascular: RRR, No MRG Respiratory: CTA B Gastrointestinal: abdomen soft, NT, ND, BS+ Musculoskeletal: no deformities, strength intact in all 4 Skin: moist, warm, no rashes Neurological: no tremor with outstretched hands, DTR normal in all 4  ASSESSMENT: 1. DM2, non-insulin-dependent, uncontrolled, without long-term complications but with hyperglycemia  She does not have a family history of medullary thyroid cancer or personal history of pancreatitis.   2. HL  3.  Obesity class III  PLAN:  1. Patient with longstanding, uncontrolled, type 2 diabetes, on oral antidiabetic regimen with metformin, sulfonylurea, and also weekly GLP-1 receptor agonist. -She has a history of gastric bypass surgery in 2016, after which her sugars improved significantly, but she was not able to follow-up due to loss of insurance.  She had a hip replacement surgery and starting to gain weight afterwards and diabetes worsened.  This became even worse in 07/2018 after a flulike illness.  Jardiance was added 11/2018 but she had increased GERD and enuresis afterwards so it was stopped.  Metformin gives her occasional diarrhea but she overall tolerates it.  Sugars improved significantly after starting Ozempic, however, at the beginning of the year, we had to switch from [place] to [place] for insurance preference.  I sent Bydureon BCise, however, this was not covered by her insurance.  She is now off Ozempic for 2 weeks. -At last visit, HbA1c was better, at 6.5% but she was not checking  sugars consistently.  I strongly advised her to start doing so.  I refilled all of her diabetes prescriptions at that time.  At this visit, she tells me that she is still not checking blood sugars.  I strongly advised her to start doing so. -We checked her HbA1c: 6.2% (lower) -At this visit, we gave her a sample of Ozempic and I called in regular Bydureon pens for her.  We absolutely need to get her back on a GLP-1 receptor agonist she has had spectacular results from these!  Particularly, I would definitely want her to be back on Ozempic! - I suggested to:  Patient Instructions  Please continue: - Metformin 1000 mg 2x a day with meals - Glipizide XL 5 mg 2x a day 15-30 min before meals  Try to start: - Bydureon 2 mg weekly in a.m.   Please return in 3-4 months with your sugar log.   - advised to check sugars at different times of the day - 1x a day, rotating check times - advised for yearly eye exams >> she is UTD - return to clinic in 3-4 months  2. HL -Reviewed latest lipid panel from 07/2019: LDL above goal, triglycerides also high: Lab Results  Component Value Date   CHOL 220 (H) 07/25/2019  HDL 58.40 07/25/2019   LDLDIRECT 132.0 07/25/2019   TRIG 306.0 (H) 07/25/2019   CHOLHDL 4 07/25/2019  -Continues Crestor 20 mg daily without side effects  3.  Obesity class III -Continue GLP-1 receptor agonist, which should also help with weight loss.  Unfortunately, we have to switch from [place] to [employer] per insurance preference -She lost 8 pounds before last visit, now weight stable  Philemon Kingdom, MD PhD [facility]

## 2020-05-28 NOTE — Addendum Note (Signed)
Addended by: Lauralyn Primes on: 05/28/2020 04:47 PM   Modules accepted: Orders

## 2020-05-31 ENCOUNTER — Other Ambulatory Visit: Payer: Self-pay | Admitting: Obstetrics and Gynecology

## 2020-05-31 DIAGNOSIS — N644 Mastodynia: Secondary | ICD-10-CM

## 2020-06-21 ENCOUNTER — Ambulatory Visit
Admission: RE | Admit: 2020-06-21 | Discharge: 2020-06-21 | Disposition: A | Discharge status: Home / Self Care | Payer: Medicaid Other | Source: Ambulatory Visit | Attending: Obstetrics and Gynecology | Admitting: Obstetrics and Gynecology

## 2020-06-21 ENCOUNTER — Other Ambulatory Visit: Payer: Self-pay

## 2020-06-21 ENCOUNTER — Ambulatory Visit: Payer: Medicaid Other

## 2020-06-21 DIAGNOSIS — N644 Mastodynia: Secondary | ICD-10-CM

## 2020-07-10 ENCOUNTER — Other Ambulatory Visit: Payer: Medicaid Other

## 2020-07-11 ENCOUNTER — Telehealth: Payer: Self-pay

## 2020-07-11 NOTE — Telephone Encounter (Signed)
Insurance company has denied coverage for Ozemopic 0.25-0.5 dose pen

## 2020-08-15 ENCOUNTER — Other Ambulatory Visit: Payer: Self-pay

## 2020-08-15 ENCOUNTER — Ambulatory Visit: Payer: Medicaid Other | Attending: Family Medicine | Admitting: Physical Therapy

## 2020-08-15 ENCOUNTER — Encounter: Payer: Self-pay | Admitting: Physical Therapy

## 2020-08-15 DIAGNOSIS — M6281 Muscle weakness (generalized): Secondary | ICD-10-CM | POA: Insufficient documentation

## 2020-08-15 DIAGNOSIS — M25511 Pain in right shoulder: Secondary | ICD-10-CM | POA: Insufficient documentation

## 2020-08-15 DIAGNOSIS — M25552 Pain in left hip: Secondary | ICD-10-CM | POA: Diagnosis present

## 2020-08-15 NOTE — Therapy (Signed)
[facility] [address] [place], [place], [address] Phone: [phone]   Fax:  [phone]  Physical Therapy Evaluation  Patient Details  Name: Alice Simpson MRN: [id number] Date of Birth: [birth year] Referring Provider (PT): London Pepper, MD   Encounter Date: 08/16/2075   PT End of Session - 08/15/20 1128    Visit Number 1    Number of Visits 12    Authorization Type Medicaid - Have submitted for first 3 visits    PT Start Time 0932    PT Stop Time 1017    PT Time Calculation (min) 45 min    Activity Tolerance Patient tolerated treatment well    Behavior During Therapy [facility] for tasks assessed/performed           Past Medical History:  Diagnosis Date  . ADD (attention deficit disorder)   . Anxiety   . Arthritis   . Depression   . Diabetes mellitus without complication ([place])   . Hyperlipidemia   . Migraine   . Neuropathy   . Neuropathy   . Osteoarthritis   . PCOS (polycystic ovarian syndrome)   . Pulsatile tinnitus 07/06/2018  . Vertigo     Past Surgical History:  Procedure Laterality Date  . BUNIONECTOMY    . CESAREAN SECTION    . LAPAROSCOPIC GASTRIC SLEEVE RESECTION N/A 09/24/2014   Procedure: LAPAROSCOPIC GASTRIC SLEEVE RESECTION upper endoscopy;  Surgeon: Johnathan Hausen, MD;  Location: [facility];  Service: General;  Laterality: N/A;  . RADIOACTIVE SEED GUIDED EXCISIONAL BREAST BIOPSY Left 03/31/2019   Procedure: RADIOACTIVE SEED GUIDED EXCISIONAL LEFT BREAST BIOPSY X2;  Surgeon: Johnathan Hausen, MD;  Location: [facility];  Service: General;  Laterality: Left;  . TOTAL HIP ARTHROPLASTY Right 11/20/2014  . TOTAL HIP ARTHROPLASTY Right 11/20/2014   Procedure: TOTAL HIP ARTHROPLASTY ANTERIOR APPROACH;  Surgeon: Melrose Nakayama, MD;  Location: [employer];  Service: Orthopedics;  Laterality: Right;  . wisdom    . WISDOM TOOTH EXTRACTION      There were no vitals filed for this visit.     Subjective Assessment - 08/15/20 0852    Subjective Pt reports to clinic with left hip pain.  Last year in january she recieved an x-ray that was a negative.  R hip replacement (2016).  The last couple of months pain has progressed.  One month ago she recieved a nerve ablation in her back and her right knee.  She feels the pain got worse following those two procedures.  Pain localized to posterior and lateral glute region above greater trochanter.  She says it hurts worse walking up stairs and getting out of passenger car. Rest is the only relief for her pain.  Pt reports she is also experiencing right shoulder pain and considering this the worst of the two becuase it limits her so much in her daily life.  Her pain began about a month ago and is the same pain she felt two years ago when she originally sought physical therapy.  She has been doing her same pain relieving exercises including tens, ice and heat, but has no reduction in symptoms.  She describes her symptoms as pins and needles down into hand specifically into digits 3-4.  The pain is isolated to her anterior/medial deltoid when it does not travel down her arm. Any arm movement causes pain and resting has been the only relief.  She does not know what caused the pain and she reports no imaging  for her shoulder.    Currently in Pain? Yes    Pain Location Shoulder    Pain Orientation Right    Pain Descriptors / Indicators Aching;Tingling;Numbness    Pain Onset More than a month ago    Pain Frequency Constant    Aggravating Factors  lifting arm    Pain Relieving Factors not moving arm    Multiple Pain Sites Yes    Pain Location Hip    Pain Orientation Left    Pain Descriptors / Indicators Aching;Tender    Pain Type Chronic pain    Pain Onset More than a month ago    Pain Frequency Intermittent    Aggravating Factors  stairs, getting out of car    Pain Relieving Factors res              [facility] PT Assessment - 08/15/20 0901       Assessment   Medical Diagnosis right shoulder/neck pain, left hip pain    Referring Provider (PT) London Pepper, MD    Onset Date/Surgical Date 08/12/20    Prior Therapy yes 3 visits prior to COVID lock down      Precautions   Precautions Other (comment)    Precaution Comments PMH: DM, HLD, depression, OA R hip and knee, R hip replacement 2016      Balance Screen   Has the patient fallen in the past 6 months No    Has the patient had a decrease in activity level because of a fear of falling?  No    Is the patient reluctant to leave their home because of a fear of falling?  No      Home Social worker Private residence    Living Arrangements Non-relatives/Friends   roommate   Available Help at Discharge Family    Type of [place] to enter    Entrance Stairs-Number of Steps 1    [place] One level   but 2 steps to get to kitchen with handrails on either side     Prior Function   Level of Independence Independent    Vocation On disability    Leisure dog sitting      Sensation   Light Touch Impaired by gross assessment   RLE sensation difference compared to LLE - lateral hip to medial knee and calf     ROM / Strength   AROM / PROM / Strength AROM;PROM;Strength      AROM   AROM Assessment Site Shoulder;Cervical    Right/Left Shoulder Right    Right Shoulder Flexion 60 Degrees   pain   Right Shoulder ABduction 39 Degrees   limited by pain   Right Shoulder Internal Rotation --   cannot reach past midline gross testing   Right Shoulder Horizontal  ADduction 20 Degrees   pain in deltoid region   Cervical Flexion 36    Cervical Extension 44    Cervical - Right Side Bend 21    Cervical - Left Side Bend 24    Cervical - Right Rotation 44    Cervical - Left Rotation 47      PROM   Overall PROM  Deficits    Overall PROM Comments WFL for shoulder flex, ABD, IR.  Limited ER with a soft tissue end feel.      Strength   Overall Strength  Unable to assess;Deficits    Overall Strength Comments Unable to perform R shoulder gross  testing due to limited AROM    Strength Assessment Site Shoulder    Right/Left Shoulder Right    Right Shoulder Internal Rotation 5/5   Performed in sitting with arm at side   Right Shoulder External Rotation 4/5   performed in sitting with arm at side; pain     Flexibility   Soft Tissue Assessment /Muscle Length yes   infraspinatous, teres major and minor, deltoid, upper trap, pec major and minor tender ponits based on palpation     Special Tests    Special Tests Rotator Cuff Impingement    Rotator Cuff Impingment tests Drop Arm test      Drop Arm test   Findings Negative    Side Right    Comment Pt reported pt but able to maintain position      Ambulation/Gait   Ambulation/Gait Yes    Ambulation/Gait Assistance 7: Independent    Ambulation Distance (Feet) 115 Feet    Assistive device None    Gait Pattern Decreased stance time - left;Antalgic    Ambulation Surface Level;Indoor                      Objective measurements completed on examination: See above findings.       [facility] Adult PT Treatment/Exercise - 08/15/20 1045      Manual Therapy   Manual Therapy Myofascial release;Soft tissue mobilization    Manual therapy comments Educated pt about home manuever with massage ball against wall for upper trap and periscapular musculature                  PT Education - 08/15/20 1127    Education Details Differential diagnosis, clinical findings, POC, self soft tissue mobilization with massage ball    Person(s) Educated Patient    Methods Explanation    Comprehension Verbalized understanding            PT Short Term Goals - 08/15/20 1149      PT SHORT TERM GOAL #1   Title Pt will be independent with initial HEP    Baseline No HEP yet    Time 4    Period Weeks    Status New    Target Date 09/14/20      PT SHORT TERM GOAL #2   Title Pt will begin to  perform ADLs with RUE for >/= 10 minutes without significant pain to return to independent function    Baseline Unable to perform due to pain    Time 4    Period Weeks    Status New    Target Date 09/14/20      PT SHORT TERM GOAL #3   Title Pt will improve shoulder AROM to 90 flex, 60 ABD and past midline for IR to display reduction in pain and return to function    Baseline Shoulder flex 60, ABD 39, IR not past midline    Time 4    Period Weeks    Status New    Target Date 09/14/20      PT SHORT TERM GOAL #4   Title Pt will improve cervical AROM to 50 rotation, 30 lateral flexion to restore functional mobility    Baseline Cervical AROM R rotation 44, L rotation 47, R lateral flexion 24, L lateral flexion 21    Time 4    Period Weeks    Status New    Target Date 09/14/20      PT SHORT TERM GOAL #5  Title Pt will undergo further assessment of L hip pain including AROM, MMT, and any soft tissue restrictions present to establish further goals and lead POC    Baseline Have not assessed    Time 4    Period Weeks    Status New    Target Date 09/14/20             PT Long Term Goals - 08/15/20 1314      PT LONG TERM GOAL #1   Title Pt will be independent with final HEP    Baseline Has not begun    Time 8    Period Weeks    Status New    Target Date 10/14/20      PT LONG TERM GOAL #2   Title Pt will improve shoulder AROM to 110 flexion, 90 ABD and IR to L1 to restore function and return independence    Baseline Shoulder AROM flex 60, ABD 39, IR not past midline    Time 8    Period Weeks    Target Date 10/14/20      PT LONG TERM GOAL #3   Title Pt will be able to perform RUE activities for >/= 20 minutes without pain to improve function, independence and tolerance for exercise    Baseline Pain with all RUE activities    Time 8    Period Weeks    Status New    Target Date 10/14/20      PT LONG TERM GOAL #4   Title Pt will ambulate >/= 230' without antalgic gait  pattern to decrease pain and improve L stance time    Baseline Antalgic gait    Time 8    Period Weeks    Status New    Target Date 10/14/20      PT LONG TERM GOAL #5   Title Pt will be able to negotiate 4 steps with BUE assist without significant onset of pain to demonstrate functional independence.    Baseline Did not assess    Time 8    Period Weeks    Status New    Target Date 10/14/20                  Plan - 08/15/20 1132    Clinical Impression Statement 55 yo woman presents to the clinic with chronic left hip pain and acute right shoulder pain both insidious onset.  Prior imaging on her left hip was negative and there was no imaging performed for her shoulder.  Symptoms are localized to deltoid region with intermittent episodes of radiculopathy into medial aspect of hand and digits 3 and 4.  L hip pain is localized to posterior/lateral hip with no radicular symptoms.  Currently her right shoulder pain is the most limiting factor for functional independence and was addressed as a first concern with therapy.  Objective testing significiant for decreased AROM in all R shoulder and cervical motions, decreased strength, trigger point tenderness R periscapular region, soft tissue restircitons, negative rotator cuff tear by drop arm test, and antalgic gait.  Pt would benefit from skilled physical therapy to address above limitations, reduce pain, and return to functional independence.    Personal Factors and Comorbidities Comorbidity 3+;Past/Current Experience    Comorbidities PMH: DM, HLD, depression, OA R hip and knee, R hip replacement 2016    Examination-Activity Limitations Reach Overhead;Dressing;Lift;Hygiene/Grooming;Self Feeding;Bathing;Squat;Stairs;Transfers;Locomotion Level;Bend;Carry;Bed Mobility    Examination-Participation Restrictions Cleaning;Community Activity;Driving;Laundry    Stability/Clinical Decision Making Evolving/Moderate complexity  Clinical Decision Making  Moderate    Rehab Potential Good    PT Frequency Other (comment)   1x week x3 weeks + 2x week x4 weeks   PT Duration 8 weeks    PT Treatment/Interventions ADLs/Self Care Home Management;Moist Heat;Gait training;Stair training;Functional mobility training;Therapeutic activities;Therapeutic exercise;Balance training;Neuromuscular re-education;Patient/family education;Manual techniques;Passive range of motion;Dry needling;Taping;Joint Manipulations;Cryotherapy    PT Next Visit Plan Reassess function and pain - DN upper trap/periscapular trigger points    Consulted and Agree with Plan of Care Patient           Patient will benefit from skilled therapeutic intervention in order to improve the following deficits and impairments:  Decreased mobility,Decreased range of motion,Decreased strength,Hypomobility,Increased fascial restricitons,Impaired UE functional use,Pain,Difficulty walking  Visit Diagnosis: Acute pain of right shoulder  Pain in left hip  Muscle weakness (generalized)     Problem List Patient Active Problem List   Diagnosis Date Noted  . Pulsatile tinnitus 07/06/2018  . Primary osteoarthritis of right hip 11/20/2014  . Morbid obesity ([place]) 11/20/2014  . S/P laparoscopic sleeve gastrectomy 09/24/2014  . PCOS (polycystic ovarian syndrome) 08/02/2013  . Depressive disorder 05/26/2013  . Type 2 diabetes mellitus, uncontrolled ([place]) 05/26/2013  . Hyperlipidemia 05/26/2013  . Low back pain 05/26/2013  . Migraine 05/26/2013    Yetta Numbers, SPT 08/15/2020, 4:09 PM  [place] [address] [place], [place], [address] Phone: [phone]   Fax:  [phone]  Name: Zoa Dowty MRN: [id number] Date of Birth: [birth year]

## 2020-08-19 ENCOUNTER — Other Ambulatory Visit: Payer: Self-pay

## 2020-08-19 ENCOUNTER — Ambulatory Visit (INDEPENDENT_AMBULATORY_CARE_PROVIDER_SITE_OTHER): Payer: Medicaid Other | Admitting: Podiatry

## 2020-08-19 ENCOUNTER — Ambulatory Visit (INDEPENDENT_AMBULATORY_CARE_PROVIDER_SITE_OTHER): Payer: Medicaid Other

## 2020-08-19 DIAGNOSIS — M2042 Other hammer toe(s) (acquired), left foot: Secondary | ICD-10-CM

## 2020-08-19 DIAGNOSIS — E1165 Type 2 diabetes mellitus with hyperglycemia: Secondary | ICD-10-CM

## 2020-08-19 DIAGNOSIS — M2041 Other hammer toe(s) (acquired), right foot: Secondary | ICD-10-CM

## 2020-08-22 ENCOUNTER — Encounter: Payer: Self-pay | Admitting: Podiatry

## 2020-08-22 NOTE — Progress Notes (Signed)
  Subjective:  Patient ID: Alice Simpson, female    DOB: [birth year],  MRN: [id number]  Chief Complaint  Patient presents with  . Hammer Toe     (xrays)(np) Hammer toe, with decreased sensation, history of discoloration.;left foot    55 y.o. female presents with the above complaint. History confirmed with patient.  The left second toe becomes discolored at times.  Her neuropathy is getting worse she feels like.  Her A1c is 6.2%.  Objective:  Physical Exam: warm, good capillary refill, no trophic changes or ulcerative lesions, normal DP and PT pulses and abnormal sensory exam.  Bilaterally she has semi-reducible hammertoe 2 through 5 Radiographs: X-ray of both feet: no fracture, dislocation, swelling or degenerative changes noted, hammertoe deformities a Assessment:   1. Hammertoe, bilateral   2. Uncontrolled type 2 diabetes mellitus with hyperglycemia ([place])      Plan:  Patient was evaluated and treated and all questions answered.  .Patient educated on diabetes. Discussed proper diabetic foot care and discussed risks and complications of disease. Educated patient in depth on reasons to return to the office immediately should he/she discover anything concerning or new on the feet. All questions answered. Discussed proper shoes as well.   Discussed etiology treatment options of hammertoe deformities in detail with her including surgical nonsurgical treatment.  Return if she is interested in surgical intervention.  Return if symptoms worsen or fail to improve.

## 2020-08-26 ENCOUNTER — Ambulatory Visit: Payer: Medicaid Other | Admitting: Physical Therapy

## 2020-09-02 ENCOUNTER — Encounter: Payer: Self-pay | Admitting: Physical Therapy

## 2020-09-02 ENCOUNTER — Other Ambulatory Visit: Payer: Self-pay

## 2020-09-02 ENCOUNTER — Ambulatory Visit: Payer: Medicaid Other | Attending: Family Medicine | Admitting: Physical Therapy

## 2020-09-02 DIAGNOSIS — M6281 Muscle weakness (generalized): Secondary | ICD-10-CM

## 2020-09-02 DIAGNOSIS — M25552 Pain in left hip: Secondary | ICD-10-CM | POA: Insufficient documentation

## 2020-09-02 NOTE — Patient Instructions (Signed)
Access Code: [email or web address] URL: [email or web address] Date: 09/02/2020 Prepared by: Hildred Alamin Laurent Cargile  Exercises Seated Piriformis Stretch - 1 x daily - 7 x weekly - 2 sets - 30 hold Supine ITB Stretch with Strap - 1 x daily - 7 x weekly - 2 sets - 30 hold

## 2020-09-02 NOTE — Therapy (Signed)
[facility] [address] [place], [place], [address] Phone: [phone]   Fax:  [phone]  Physical Therapy Treatment  Patient Details  Name: Alice Simpson MRN: [id number] Date of Birth: May 08, 1966 Referring Provider (PT): London Pepper, MD   Encounter Date: 09/02/2020   PT End of Session - 09/02/20 0857    Visit Number 2    Number of Visits 12    Authorization Type Medicaid - Have submitted for first 3 visits    PT Start Time 0804    PT Stop Time 0846    PT Time Calculation (min) 42 min    Activity Tolerance Patient tolerated treatment well    Behavior During Therapy [facility] for tasks assessed/performed           Past Medical History:  Diagnosis Date  . ADD (attention deficit disorder)   . Anxiety   . Arthritis   . Depression   . Diabetes mellitus without complication ([place])   . Hyperlipidemia   . Migraine   . Neuropathy   . Neuropathy   . Osteoarthritis   . PCOS (polycystic ovarian syndrome)   . Pulsatile tinnitus 07/06/2018  . Vertigo     Past Surgical History:  Procedure Laterality Date  . BUNIONECTOMY    . CESAREAN SECTION    . LAPAROSCOPIC GASTRIC SLEEVE RESECTION N/A 09/24/2014   Procedure: LAPAROSCOPIC GASTRIC SLEEVE RESECTION upper endoscopy;  Surgeon: Johnathan Hausen, MD;  Location: [facility];  Service: General;  Laterality: N/A;  . RADIOACTIVE SEED GUIDED EXCISIONAL BREAST BIOPSY Left 03/31/2019   Procedure: RADIOACTIVE SEED GUIDED EXCISIONAL LEFT BREAST BIOPSY X2;  Surgeon: Johnathan Hausen, MD;  Location: [facility];  Service: General;  Laterality: Left;  . TOTAL HIP ARTHROPLASTY Right 11/20/2014  . TOTAL HIP ARTHROPLASTY Right 11/20/2014   Procedure: TOTAL HIP ARTHROPLASTY ANTERIOR APPROACH;  Surgeon: Melrose Nakayama, MD;  Location: [place];  Service: Orthopedics;  Laterality: Right;  . wisdom    . WISDOM TOOTH EXTRACTION      There were no vitals filed for this visit.    Subjective Assessment - 09/02/20 0808    Subjective Pt reports her R shoulder is much better, she is able to move it without pain after her roommate used a vibration tool.  Both of her hips are hurting her today L>R.  She says her R knee is swollen and that her joint comes out of place a lot.  She feels a stabbing pain when she has to bend at her hip.    Currently in Pain? Yes    Pain Location Hip    Pain Orientation Right;Left    Pain Onset More than a month ago    Pain Onset --              [facility] PT Assessment - 09/02/20 0831      PROM   Overall PROM  Deficits    Overall PROM Comments Decreased PROM L IR, excessive L ER, decreased L HS compared to R.  R restricitons knee flexion - soft tissue end feel.  Pain into groin R hip flexion.      Strength   Strength Assessment Site Hip    Right/Left Hip Right;Left    Right Hip ABduction 2/5   unable to actively move through range - PROM normal   Left Hip ABduction 2/5      Palpation   Palpation comment Tightness and trigger point tenderness in L TFL/ITB  Special Tests   Other special tests Positive L Ober Test, negative R Ober Test, R Thomas test tight hip flexion> L [employer] - restrictions still present in L hip flexion.                         [facility] Adult PT Treatment/Exercise - 09/02/20 0831      Transfers   Transfers Sit to Stand    Sit to Stand 5: Supervision    Sit to Stand Details Verbal cues for technique;Verbal cues for sequencing    Sit to Stand Details (indicate cue type and reason) x10; LLE increased help upon initial perfromance.  Verbal cueing to correct feet, unable to perform without UE assist with proper foot placement, L hip and trunk rotation noted, unable to correct with proper verbal sequencing/technique.  Lack of eccentric control.      Ambulation/Gait   Ambulation/Gait Yes    Ambulation/Gait Assistance 7: Independent    Ambulation Distance (Feet) 100 Feet    Assistive device None     Gait Pattern Decreased stance time - left;Ataxic;Lateral hip instability    Ambulation Surface Level;Indoor    Stairs Yes    Stairs Assistance 7: Independent    Stair Management Technique Two rails    Number of Stairs 16   Normal negootiation of steps include step to pattern leading with RLE, backwards walking down steps with reiciprocal pattern. Pt able to perform reciprocal steps up and down but increased in L hip pain, increased bilateral hip drop, decreased ecc control     Exercises   Exercises Other Exercises;Knee/Hip    Other Exercises  Single Leg Stance: Wihtout UE support, 4 sec on RLE, 7 sec on LLE.  Increased pain in hip during.  With UE support lateral hip drop noted bilaterally; able to correct with verbal cueing, no increase in hip pain      Knee/Hip Exercises: Stretches   ITB Stretch Both;2 reps;30 seconds;Limitations    ITB Stretch Limitations Supine with strap; verbal and tactile cueing to maintain pelvis on table    Piriformis Stretch Both;2 reps;30 seconds;Limitations    Piriformis Stretch Limitations Seated; verbal and visual cueing to maintian upright trunk and hinge at hips for increased stretch                  PT Education - 09/02/20 0959    Education Details Assessment and clinical finding of hip pain, initial HEP, continued use of vibration tool as needed    Person(s) Educated Patient    Methods Explanation;Handout    Comprehension Verbalized understanding            PT Short Term Goals - 09/02/20 1007      PT SHORT TERM GOAL #1   Title Pt will be independent with initial HEP    Baseline No HEP yet    Time 4    Period Weeks    Status On-going    Target Date 09/14/20      PT SHORT TERM GOAL #2   Title Pt will begin to perform ADLs with RUE for >/= 10 minutes without significant pain to return to independent function    Baseline No pain in R shoulder    Time 4    Period Weeks    Status Achieved    Target Date 09/14/20      PT SHORT TERM  GOAL #3   Title Pt will improve shoulder AROM to 90 flex, 60  ABD and past midline for IR to display reduction in pain and return to function    Baseline AROM R Shoulder WFL    Time 4    Period Weeks    Status Achieved    Target Date 09/14/20      PT SHORT TERM GOAL #4   Title Pt will improve cervical AROM to 50 rotation, 30 lateral flexion to restore functional mobility    Baseline Cervical AROM R rotation 44, L rotation 47, R lateral flexion 24, L lateral flexion 21    Time 4    Period Weeks    Status New    Target Date 09/14/20      PT SHORT TERM GOAL #5   Title Pt will undergo further assessment of L hip pain including AROM, MMT, and any soft tissue restrictions present to establish further goals and lead POC    Baseline Assessment of Hip and development of appropriate LTGs    Time 4    Period Weeks    Status Achieved    Target Date 09/14/20             PT Long Term Goals - 09/02/20 1009      PT LONG TERM GOAL #1   Title Pt will be independent with final HEP    Baseline Has not begun    Time 8    Period Weeks    Status New    Target Date 10/14/20      PT LONG TERM GOAL #2   Title Pt will improve shoulder AROM to 110 flexion, 90 ABD and IR to L1 to restore function and return independence    Baseline AROM R Shoulder WFL    Time 8    Period Weeks    Status Achieved    Target Date 10/14/20      PT LONG TERM GOAL #3   Title Pt will be able to perform RUE activities for >/= 20 minutes without pain to improve function, independence and tolerance for exercise    Baseline No pain with R shoulder - mild soreness noted.    Time 8    Period Weeks    Status Achieved    Target Date 10/14/20      PT LONG TERM GOAL #4   Title Pt will ambulate >/= 230' without antalgic gait pattern to decrease pain and improve L stance time    Baseline Antalgic gait    Time 8    Period Weeks    Status New    Target Date 10/14/20      PT LONG TERM GOAL #5   Title Pt will be able to  negotiate 4 steps with BUE assist without significant onset of pain to demonstrate functional independence.    Baseline 4 steps reciprocal pattern up/down BUE support significant increase in bilateral hip pain    Time 8    Period Weeks    Status New    Target Date 10/14/20      Additional Long Term Goals   Additional Long Term Goals Yes      PT LONG TERM GOAL #6   Title Pt will increase single leg stance time to >/=12sec each leg to demonstrate improved LE strength and function    Baseline 4 sec RLE, 7 sec LLE    Time 8    Period Weeks    Status New    Target Date 10/14/20      PT LONG TERM GOAL #7  Title Pt will be able to perform proper sequencing of sit to stand without UE support and no significant onset of hip pain to demonstrate improve functional independence    Baseline UE support needed and increase in hip pain    Time 8    Period Weeks    Status New    Target Date 10/14/20                 Plan - 09/02/20 1000    Clinical Impression Statement Pt reports resolution of R shoulder pain.  Assessment of bilateral hip pain included decreased bilateral hip ABD strength, increased PROM R ER and decreased R IR, trigger point tenderness TFL/ITB, decreased functional strength, poor movement patterns, and increased pain with movement.  Initial HEP included stretches to address pain and soft tissue restrictions.  Updated goals to address improved shoulder function and assessment of hip.    Personal Factors and Comorbidities Comorbidity 3+;Past/Current Experience    Comorbidities PMH: DM, HLD, depression, OA R hip and knee, R hip replacement 2016    Examination-Activity Limitations Reach Overhead;Dressing;Lift;Hygiene/Grooming;Self Feeding;Bathing;Squat;Stairs;Transfers;Locomotion Level;Bend;Carry;Bed Mobility    Examination-Participation Restrictions Cleaning;Community Activity;Driving;Laundry    Stability/Clinical Decision Making Evolving/Moderate complexity    Rehab Potential  Good    PT Frequency Other (comment)   1x week x3 weeks + 2x week x4 weeks   PT Duration 8 weeks    PT Treatment/Interventions ADLs/Self Care Home Management;Moist Heat;Gait training;Stair training;Functional mobility training;Therapeutic activities;Therapeutic exercise;Balance training;Neuromuscular re-education;Patient/family education;Manual techniques;Passive range of motion;Dry needling;Taping;Joint Manipulations;Cryotherapy    PT Next Visit Plan Check STG and request more visits.  Is shoulder still feeling good?  Initiate strengthening program for bilateral hips - posterior hip musculature (ER tight L, R hip flexor tight, weak ABD).    Consulted and Agree with Plan of Care Patient           Patient will benefit from skilled therapeutic intervention in order to improve the following deficits and impairments:  Decreased mobility,Decreased range of motion,Decreased strength,Hypomobility,Increased fascial restricitons,Impaired UE functional use,Pain,Difficulty walking  Visit Diagnosis: Pain in left hip  Muscle weakness (generalized)     Problem List Patient Active Problem List   Diagnosis Date Noted  . Pulsatile tinnitus 07/06/2018  . Primary osteoarthritis of right hip 11/20/2014  . Morbid obesity ([place]) 11/20/2014  . S/P laparoscopic sleeve gastrectomy 09/24/2014  . PCOS (polycystic ovarian syndrome) 08/02/2013  . Depressive disorder 05/26/2013  . Type 2 diabetes mellitus, uncontrolled ([place]) 05/26/2013  . Hyperlipidemia 05/26/2013  . Low back pain 05/26/2013  . Migraine 05/26/2013    Yetta Numbers, SPT 09/02/2020, 10:16 AM  [facility] [address] [place], [place], [address] Phone: [phone]   Fax:  [phone]  Name: Annis Lagoy MRN: [id number] Date of Birth: 1965-05-24

## 2020-09-09 ENCOUNTER — Ambulatory Visit: Payer: Medicaid Other | Admitting: Physical Therapy

## 2020-09-10 ENCOUNTER — Telehealth: Payer: Self-pay

## 2020-09-10 NOTE — Telephone Encounter (Signed)
PA for Ozempic has been sent to plan Contact plan to follow up on [facility]

## 2020-09-24 ENCOUNTER — Ambulatory Visit: Payer: Medicaid Other | Admitting: Physical Therapy

## 2020-09-25 ENCOUNTER — Ambulatory Visit: Payer: Medicaid Other | Admitting: Internal Medicine

## 2020-09-26 ENCOUNTER — Encounter: Payer: Medicaid Other | Admitting: Physical Therapy

## 2020-09-30 ENCOUNTER — Other Ambulatory Visit: Payer: Self-pay

## 2020-09-30 ENCOUNTER — Ambulatory Visit: Payer: Medicaid Other | Attending: Family Medicine | Admitting: Physical Therapy

## 2020-09-30 DIAGNOSIS — R262 Difficulty in walking, not elsewhere classified: Secondary | ICD-10-CM | POA: Insufficient documentation

## 2020-09-30 DIAGNOSIS — M6281 Muscle weakness (generalized): Secondary | ICD-10-CM | POA: Diagnosis present

## 2020-09-30 DIAGNOSIS — M25552 Pain in left hip: Secondary | ICD-10-CM | POA: Diagnosis not present

## 2020-09-30 NOTE — Therapy (Signed)
[facility] [address] [place], [place], [address] Phone: [phone]   Fax:  [phone]  Physical Therapy Treatment  Patient Details  Name: Alice Simpson MRN: [id number] Date of Birth: [birth year] Referring Provider (PT): London Pepper, MD   Encounter Date: 09/30/2020   PT End of Session - 09/30/20 1105    Visit Number 3    Number of Visits 12    Authorization Type Medicaid - Have submitted for first 3 visits; will request 8 more    PT Start Time 1103    PT Stop Time 1150    PT Time Calculation (min) 47 min    Activity Tolerance Patient tolerated treatment well    Behavior During Therapy [facility] for tasks assessed/performed           Past Medical History:  Diagnosis Date  . ADD (attention deficit disorder)   . Anxiety   . Arthritis   . Depression   . Diabetes mellitus without complication ([place])   . Hyperlipidemia   . Migraine   . Neuropathy   . Neuropathy   . Osteoarthritis   . PCOS (polycystic ovarian syndrome)   . Pulsatile tinnitus 07/06/2018  . Vertigo     Past Surgical History:  Procedure Laterality Date  . BUNIONECTOMY    . CESAREAN SECTION    . LAPAROSCOPIC GASTRIC SLEEVE RESECTION N/A 09/24/2014   Procedure: LAPAROSCOPIC GASTRIC SLEEVE RESECTION upper endoscopy;  Surgeon: Johnathan Hausen, MD;  [facility];  Service: General;  Laterality: N/A;  . RADIOACTIVE SEED GUIDED EXCISIONAL BREAST BIOPSY Left 03/31/2019   Procedure: RADIOACTIVE SEED GUIDED EXCISIONAL LEFT BREAST BIOPSY X2;  Surgeon: Johnathan Hausen, MD;  Location: [facility];  Service: General;  Laterality: Left;  . TOTAL HIP ARTHROPLASTY Right 11/20/2014  . TOTAL HIP ARTHROPLASTY Right 11/20/2014   Procedure: TOTAL HIP ARTHROPLASTY ANTERIOR APPROACH;  Surgeon: Melrose Nakayama, MD;  Location: [facility];  Service: Orthopedics;  Laterality: Right;  . wisdom    . WISDOM TOOTH EXTRACTION      There were no vitals filed  for this visit.   Subjective Assessment - 09/30/20 1106    Subjective Feels like the stretches are really bothering her low back; is interested in trying gentle yoga at [place] and transferring to [facility] where she can use the fitness center, PT and aquatic.    Limitations Walking    Currently in Pain? Yes    Pain Onset More than a month ago              [facility] PT Assessment - 09/30/20 1115      Assessment   Medical Diagnosis right shoulder/neck pain, left hip pain    Referring Provider (PT) London Pepper, MD    Onset Date/Surgical Date 08/12/20    Hand Dominance Left    Prior Therapy yes 3 visits prior to COVID lock down      Precautions   Precautions Other (comment)    Precaution Comments PMH: DM, HLD, depression, OA R hip and knee, R hip replacement 2016      Prior Function   Level of Independence Independent    Vocation On disability      ROM / Strength   AROM / PROM / Strength AROM      AROM   Overall AROM  Within functional limits for tasks performed    AROM Assessment Site Shoulder;Cervical    Right/Left Shoulder Right    Right Shoulder Flexion 180  Degrees    Right Shoulder ABduction 180 Degrees    Right Shoulder Internal Rotation 90 Degrees    Right Shoulder External Rotation 90 Degrees    Cervical Flexion 50    Cervical Extension 50    Cervical - Right Side Bend 30    Cervical - Left Side Bend 30    Cervical - Right Rotation 45    Cervical - Left Rotation 45                         [facility] Adult PT Treatment/Exercise - 09/30/20 1204      Therapeutic Activites    Therapeutic Activities Other Therapeutic Activities    Other Therapeutic Activities Discussed plan for therapy and plan to request more visits and transition to [facility] for PT, fitness and aquatic therapy.      Exercises   Exercises Knee/Hip    Other Exercises  Updated HEP and removed strap stretches.  Added gentle LE rotations, IR and straight leg glute sets.  See below.               Access Code: [email or web address] URL: [email or web address] Date: 09/30/2020 Prepared by: Misty Stanley  Exercises Supine Lower Trunk Rotation - 1 x daily - 7 x weekly - 1 sets - 5 reps Straight Leg Activation - 1 x daily - 7 x weekly - 2 sets - 10 reps - 6 second hold Knees rotating In - 1 x daily - 7 x weekly - 2 sets - 10 reps        PT Education - 09/30/20 1215    Education Details see TA; updatd HEP    Person(s) Educated Patient    Methods Explanation;Demonstration;Handout    Comprehension Verbalized understanding;Returned demonstration            PT Short Term Goals - 09/30/20 1122      PT SHORT TERM GOAL #1   Title Pt will be independent with initial HEP    Baseline updated/revised 5/16    Time 4    Period Weeks    Status Achieved    Target Date 09/14/20      PT SHORT TERM GOAL #2   Title Pt will begin to perform ADLs with RUE for >/= 10 minutes without significant pain to return to independent function    Baseline No pain in shoulder with ADL's    Time 4    Period Weeks    Status Achieved    Target Date 09/14/20      PT SHORT TERM GOAL #3   Title Pt will improve shoulder AROM to 90 flex, 60 ABD and past midline for IR to display reduction in pain and return to function    Baseline AROM R Shoulder WFL    Time 4    Period Weeks    Status Achieved    Target Date 09/14/20      PT SHORT TERM GOAL #4   Title Pt will improve cervical AROM to 50 rotation, 30 lateral flexion to restore functional mobility    Baseline Cervical AROM R rotation 45, lateral flexion 30,    Time 4    Period Weeks    Status Partially Met    Target Date 09/14/20      PT SHORT TERM GOAL #5   Title Pt will undergo further assessment of L hip pain including AROM, MMT, and any soft tissue restrictions present to establish further goals and lead POC  Baseline Performed assessment of hip and set LTG    Time 4    Period Weeks    Status Achieved    Target Date  09/14/20             PT Long Term Goals - 09/30/20 1218      PT LONG TERM GOAL #1   Title Pt will be independent with final HEP    Baseline Updated 5/16    Time 8    Period Weeks    Status Revised      PT LONG TERM GOAL #2   Title Pt will improve shoulder AROM to 110 flexion, 90 ABD and IR to L1 to restore function and return independence    Baseline Met 5/16    Time 8    Period Weeks    Status Achieved      PT LONG TERM GOAL #3   Title Pt will be able to perform RUE activities for >/= 20 minutes without pain to improve function, independence and tolerance for exercise    Baseline Met 5/16    Time 8    Period Weeks    Status Achieved      PT LONG TERM GOAL #4   Title Pt will ambulate >/= 230' without antalgic gait pattern to decrease pain and improve L stance time    Baseline Antalgic gait    Time 8    Period Weeks    Status New      PT LONG TERM GOAL #5   Title Pt will be able to negotiate 4 steps with BUE assist without significant onset of pain to demonstrate functional independence.    Baseline 4 steps reciprocal pattern up/down BUE support significant increase in bilateral hip pain    Time 8    Period Weeks    Status New      PT LONG TERM GOAL #6   Title Pt will increase single leg stance time to >/=12sec each leg to demonstrate improved LE strength and function    Baseline 4 sec RLE, 7 sec LLE    Time 8    Period Weeks    Status New      PT LONG TERM GOAL #7   Title Pt will be able to perform proper sequencing of sit to stand without UE support and no significant onset of hip pain to demonstrate improve functional independence    Baseline UE support needed and increase in hip pain    Time 8    Period Weeks    Status New                 Plan - 09/30/20 1217    Clinical Impression Statement Assessed patient's progress towards goal.  Pt is making good progress and has met 4/5 STG and has met 2 LTG.  Pt is reporting full resolution of R shoulder  pain and demonstrates full, pain-free AROM and full use with ADLs.  Pt also demonstrates [facility] and symmetrical cervical ROM.  Pt continues to present with significant pain, decreased ROM and decreased strength in bilat hips, R > L and antalgic gait.  Pt would benefit from ongoing physical therapy to address these impairments to maximize functional mobility independence and decrease falls risk.  Pt would also like to transition to the [facility] for ongoing PT focusing on long term fitness plan, aquatic therapy and classes that she can participate in once she has D/C from therapy.    Personal Factors and  Comorbidities Comorbidity 3+;Past/Current Experience    Comorbidities PMH: DM, HLD, depression, OA R hip and knee, R hip replacement 2016    Examination-Activity Limitations Reach Overhead;Dressing;Lift;Hygiene/Grooming;Self Feeding;Bathing;Squat;Stairs;Transfers;Locomotion Level;Bend;Carry;Bed Mobility    Examination-Participation Restrictions Cleaning;Community Activity;Driving;Laundry    Stability/Clinical Decision Making --    Rehab Potential Good    PT Frequency 1x / week    PT Duration 8 weeks    PT Treatment/Interventions ADLs/Self Care Home Management;Moist Heat;Gait training;Stair training;Functional mobility training;Therapeutic activities;Therapeutic exercise;Balance training;Neuromuscular re-education;Patient/family education;Manual techniques;Passive range of motion;Dry needling;Taping;Joint Manipulations;Cryotherapy;Aquatic Therapy;Ultrasound    PT Next Visit Plan How is HEP?  Transition to [place]?  Initiate strengthening program for bilateral hips - posterior hip musculature (ER tight L, R hip flexor tight, weak ABD).    Consulted and Agree with Plan of Care Patient           Patient will benefit from skilled therapeutic intervention in order to improve the following deficits and impairments:  Decreased mobility,Decreased range of motion,Decreased strength,Increased fascial  restricitons,Pain,Difficulty walking  Visit Diagnosis: Pain in left hip  Muscle weakness (generalized)  Difficulty in walking, not elsewhere classified     Problem List Patient Active Problem List   Diagnosis Date Noted  . Pulsatile tinnitus 07/06/2018  . Primary osteoarthritis of right hip 11/20/2014  . Morbid obesity ([place]) 11/20/2014  . S/P laparoscopic sleeve gastrectomy 09/24/2014  . PCOS (polycystic ovarian syndrome) 08/02/2013  . Depressive disorder 05/26/2013  . Type 2 diabetes mellitus, uncontrolled ([place]) 05/26/2013  . Hyperlipidemia 05/26/2013  . Low back pain 05/26/2013  . Migraine 05/26/2013    Rico Junker, PT, DPT 09/30/20    12:26 PM    [facility] [address] [place], [place], [address] Phone: [phone]   Fax:  [phone]  Name: Alice Simpson MRN: [id number] Date of Birth: 19-Feb-1966

## 2020-09-30 NOTE — Patient Instructions (Addendum)
Access Code: [email or web address] URL: [email or web address] Date: 09/30/2020 Prepared by: Misty Stanley  Exercises Supine Lower Trunk Rotation - 1 x daily - 7 x weekly - 1 sets - 5 reps Straight Leg Activation - 1 x daily - 7 x weekly - 2 sets - 10 reps - 6 second hold Knees rotating In - 1 x daily - 7 x weekly - 2 sets - 10 reps

## 2020-10-04 ENCOUNTER — Encounter: Payer: Medicaid Other | Admitting: Physical Therapy

## 2020-10-07 ENCOUNTER — Other Ambulatory Visit: Payer: Self-pay

## 2020-10-07 ENCOUNTER — Ambulatory Visit: Payer: Medicaid Other | Admitting: Physical Therapy

## 2020-10-07 ENCOUNTER — Ambulatory Visit (HOSPITAL_BASED_OUTPATIENT_CLINIC_OR_DEPARTMENT_OTHER): Payer: Medicaid Other | Attending: Family Medicine | Admitting: Physical Therapy

## 2020-10-07 ENCOUNTER — Ambulatory Visit (HOSPITAL_BASED_OUTPATIENT_CLINIC_OR_DEPARTMENT_OTHER): Payer: Medicaid Other | Admitting: Physical Therapy

## 2020-10-07 ENCOUNTER — Encounter (HOSPITAL_BASED_OUTPATIENT_CLINIC_OR_DEPARTMENT_OTHER): Payer: Self-pay | Admitting: Physical Therapy

## 2020-10-07 DIAGNOSIS — M25552 Pain in left hip: Secondary | ICD-10-CM | POA: Insufficient documentation

## 2020-10-07 DIAGNOSIS — M6281 Muscle weakness (generalized): Secondary | ICD-10-CM | POA: Diagnosis present

## 2020-10-07 DIAGNOSIS — M25511 Pain in right shoulder: Secondary | ICD-10-CM | POA: Insufficient documentation

## 2020-10-07 DIAGNOSIS — R262 Difficulty in walking, not elsewhere classified: Secondary | ICD-10-CM | POA: Diagnosis present

## 2020-10-07 NOTE — Therapy (Signed)
[place], [place], [address] Phone: [phone]   Fax:  [phone]  Physical Therapy Treatment  Patient Details  Name: Alice Simpson MRN: [id number] Date of Birth: Jul 20, 1965 Referring Provider (PT): London Pepper, MD   Encounter Date: 10/07/2020   PT End of Session - 10/07/20 1148    Visit Number 4    Number of Visits 12    Authorization Type Medicaid - Have submitted for first 3 visits; will request 8 more    PT Start Time 1015    PT Stop Time 1058    PT Time Calculation (min) 43 min    Activity Tolerance Patient tolerated treatment well    Behavior During Therapy [facility] for tasks assessed/performed           Past Medical History:  Diagnosis Date  . ADD (attention deficit disorder)   . Anxiety   . Arthritis   . Depression   . Diabetes mellitus without complication ([place])   . Hyperlipidemia   . Migraine   . Neuropathy   . Neuropathy   . Osteoarthritis   . PCOS (polycystic ovarian syndrome)   . Pulsatile tinnitus 07/06/2018  . Vertigo     Past Surgical History:  Procedure Laterality Date  . BUNIONECTOMY    . CESAREAN SECTION    . LAPAROSCOPIC GASTRIC SLEEVE RESECTION N/A 09/24/2014   Procedure: LAPAROSCOPIC GASTRIC SLEEVE RESECTION upper endoscopy;  Surgeon: Johnathan Hausen, MD;  Location: [facility];  Service: General;  Laterality: N/A;  . RADIOACTIVE SEED GUIDED EXCISIONAL BREAST BIOPSY Left 03/31/2019   Procedure: RADIOACTIVE SEED GUIDED EXCISIONAL LEFT BREAST BIOPSY X2;  Surgeon: Johnathan Hausen, MD;  Location: [place];  Service: General;  Laterality: Left;  . TOTAL HIP ARTHROPLASTY Right 11/20/2014  . TOTAL HIP ARTHROPLASTY Right 11/20/2014   Procedure: TOTAL HIP ARTHROPLASTY ANTERIOR APPROACH;  Surgeon: Melrose Nakayama, MD;  Location: [facility];  Service: Orthopedics;  Laterality: Right;  . wisdom    . WISDOM TOOTH EXTRACTION      There were no vitals filed for this  visit.   Subjective Assessment - 10/07/20 1145    Subjective Patient reports the pain is about the same. She is having pain in her knee and hip today. She is noticing popping in her knee. She is transfering here from neuro    Limitations Walking    Currently in Pain? Yes    Pain Score 7     Pain Location Hip    Pain Orientation Left    Pain Descriptors / Indicators Aching    Pain Type Chronic pain    Pain Onset More than a month ago    Pain Frequency Constant    Aggravating Factors  lifting arm    Pain Relieving Factors no moving the arm    Multiple Pain Sites No    Pain Location Knee    Pain Orientation Right    Pain Descriptors / Indicators Aching    Pain Type Chronic pain    Pain Onset More than a month ago    Aggravating Factors  stairs and getting out of the car    Pain Relieving Factors rest                             [facility] Adult PT Treatment/Exercise - 10/07/20 0001      Knee/Hip Exercises: Stretches   Other Knee/Hip Stretches lower trunk rotation x20  Knee/Hip Exercises: Supine   Other Supine Knee/Hip Exercises quad set 2x10 5 second hold      Manual Therapy   Manual Therapy Soft tissue mobilization;Joint mobilization    Manual therapy comments skilled palpation of trigger points    Joint Mobilization patelal mobilization 4 way: also reviewed how to do at home. Suggested to patient thatshe do it at home with heat    Soft tissue mobilization to gluteal and piriformis            Trigger Point Dry Needling - 10/07/20 0001    Consent Given? Yes    Education Handout Provided Yes    Muscles Treated Back/Hip Gluteus medius;Piriformis    Dry Needling Comments 2 spots to Lef tlguteal and left piriformis "warm feeling" noted    Gluteus Medius Response Twitch response elicited;Palpable increased muscle length    Piriformis Response Twitch response elicited;Palpable increased muscle length                PT Education - 10/07/20 1147     Education Details reviewed benefits and risks of TPDN    Person(s) Educated Patient    Methods Explanation;Demonstration;Tactile cues;Verbal cues    Comprehension Verbalized understanding;Verbal cues required;Returned demonstration;Tactile cues required            PT Short Term Goals - 09/30/20 1227      PT SHORT TERM GOAL #1   Title = LTG for new cert             PT Long Term Goals - 09/30/20 1218      PT LONG TERM GOAL #1   Title Pt will be independent with final HEP    Baseline Updated 5/16    Time 8    Period Weeks    Status Revised      PT LONG TERM GOAL #2   Title Pt will improve shoulder AROM to 110 flexion, 90 ABD and IR to L1 to restore function and return independence    Baseline Met 5/16    Time 8    Period Weeks    Status Achieved      PT LONG TERM GOAL #3   Title Pt will be able to perform RUE activities for >/= 20 minutes without pain to improve function, independence and tolerance for exercise    Baseline Met 5/16    Time 8    Period Weeks    Status Achieved      PT LONG TERM GOAL #4   Title Pt will ambulate >/= 230' without antalgic gait pattern to decrease pain and improve L stance time    Baseline Antalgic gait    Time 8    Period Weeks    Status New      PT LONG TERM GOAL #5   Title Pt will be able to negotiate 4 steps with BUE assist without significant onset of pain to demonstrate functional independence.    Baseline 4 steps reciprocal pattern up/down BUE support significant increase in bilateral hip pain    Time 8    Period Weeks    Status New      PT LONG TERM GOAL #6   Title Pt will increase single leg stance time to >/=12sec each leg to demonstrate improved LE strength and function    Baseline 4 sec RLE, 7 sec LLE    Time 8    Period Weeks    Status New      PT LONG TERM GOAL #7  Title Pt will be able to perform proper sequencing of sit to stand without UE support and no significant onset of hip pain to demonstrate improve  functional independence    Baseline UE support needed and increase in hip pain    Time 8    Period Weeks    Status New                 Plan - 10/07/20 1051    Clinical Impression Statement Patient retruns to the clinic today after transfering from neuro. Her C/O was pain in her right knee and left hip. Sh ehas had dry needling before and had some success. Therapy needled her gluteal and her pirifromis. She didnot have a large twitch respose but she could feel it . She has multiple trigger points in her hip and lower back. Therapy also performed manual trigger point release. She has very limited patella mobility in her knee. When she moves her knee she has a large amount of crepitus. She was show self patella mobilization. She was also given a quad set for the right. She will continue with self soft tissue mobilization and exercises over the next week then return for follow up.    Personal Factors and Comorbidities Comorbidity 3+;Past/Current Experience    Comorbidities PMH: DM, HLD, depression, OA R hip and knee, R hip replacement 2016    Examination-Activity Limitations Reach Overhead;Dressing;Lift;Hygiene/Grooming;Self Feeding;Bathing;Squat;Stairs;Transfers;Locomotion Level;Bend;Carry;Bed Mobility    Examination-Participation Restrictions Cleaning;Community Activity;Driving;Laundry    Stability/Clinical Decision Making Evolving/Moderate complexity    Clinical Decision Making Moderate    Rehab Potential Good    PT Frequency 1x / week    PT Duration 8 weeks    PT Treatment/Interventions ADLs/Self Care Home Management;Moist Heat;Gait training;Stair training;Functional mobility training;Therapeutic activities;Therapeutic exercise;Balance training;Neuromuscular re-education;Patient/family education;Manual techniques;Passive range of motion;Dry needling;Taping;Joint Manipulations;Cryotherapy;Aquatic Therapy;Ultrasound    PT Next Visit Plan How is HEP?  Transition to [place]?  Initiate  strengthening program for bilateral hips - posterior hip musculature (ER tight L, R hip flexor tight, weak ABD).    Consulted and Agree with Plan of Care Patient           Patient will benefit from skilled therapeutic intervention in order to improve the following deficits and impairments:  Decreased mobility,Decreased range of motion,Decreased strength,Increased fascial restricitons,Pain,Difficulty walking  Visit Diagnosis: Pain in left hip  Muscle weakness (generalized)  Difficulty in walking, not elsewhere classified  Acute pain of right shoulder     Problem List Patient Active Problem List   Diagnosis Date Noted  . Pulsatile tinnitus 07/06/2018  . Primary osteoarthritis of right hip 11/20/2014  . Morbid obesity ([place]) 11/20/2014  . S/P laparoscopic sleeve gastrectomy 09/24/2014  . PCOS (polycystic ovarian syndrome) 08/02/2013  . Depressive disorder 05/26/2013  . Type 2 diabetes mellitus, uncontrolled ([place]) 05/26/2013  . Hyperlipidemia 05/26/2013  . Low back pain 05/26/2013  . Migraine 05/26/2013    Carney Living PT DPT  10/07/2020, 12:01 PM  [facility] [address] [place], [place], [address] Phone: [phone]   Fax:  [phone]  Name: Inessa Wardrop MRN: [id number] Date of Birth: 04-24-1966

## 2020-10-11 ENCOUNTER — Encounter: Payer: Medicaid Other | Admitting: Physical Therapy

## 2020-10-15 ENCOUNTER — Encounter: Payer: Medicaid Other | Admitting: Physical Therapy

## 2020-10-18 ENCOUNTER — Encounter: Payer: Medicaid Other | Admitting: Physical Therapy

## 2020-10-22 ENCOUNTER — Ambulatory Visit (HOSPITAL_BASED_OUTPATIENT_CLINIC_OR_DEPARTMENT_OTHER): Payer: Medicaid Other | Attending: Family Medicine | Admitting: Physical Therapy

## 2020-10-22 ENCOUNTER — Other Ambulatory Visit: Payer: Self-pay

## 2020-10-22 DIAGNOSIS — M25552 Pain in left hip: Secondary | ICD-10-CM | POA: Insufficient documentation

## 2020-10-22 DIAGNOSIS — R262 Difficulty in walking, not elsewhere classified: Secondary | ICD-10-CM | POA: Diagnosis present

## 2020-10-22 DIAGNOSIS — M6281 Muscle weakness (generalized): Secondary | ICD-10-CM | POA: Insufficient documentation

## 2020-10-22 DIAGNOSIS — M25511 Pain in right shoulder: Secondary | ICD-10-CM | POA: Insufficient documentation

## 2020-10-23 ENCOUNTER — Encounter (HOSPITAL_BASED_OUTPATIENT_CLINIC_OR_DEPARTMENT_OTHER): Payer: Self-pay | Admitting: Physical Therapy

## 2020-10-23 NOTE — Therapy (Signed)
[place] [address] [place], [place], [address] Phone: [phone]   Fax:  [phone]  Physical Therapy Treatment  Patient Details  Name: Alice Simpson MRN: [id number] Date of Birth: Nov 08, 1965 Referring Provider (PT): London Pepper, MD   Encounter Date: 10/22/2020   PT End of Session - 10/23/20 0950    Visit Number 5    Number of Visits 12    Date for PT Re-Evaluation 12/04/20    Authorization Type Medicaid -    Authorization Time Period 8 visits apporived until 7/17    PT Start Time 1300    PT Stop Time 1340    PT Time Calculation (min) 40 min    Activity Tolerance Patient tolerated treatment well    Behavior During Therapy [facility] for tasks assessed/performed           Past Medical History:  Diagnosis Date  . ADD (attention deficit disorder)   . Anxiety   . Arthritis   . Depression   . Diabetes mellitus without complication ([place])   . Hyperlipidemia   . Migraine   . Neuropathy   . Neuropathy   . Osteoarthritis   . PCOS (polycystic ovarian syndrome)   . Pulsatile tinnitus 07/06/2018  . Vertigo     Past Surgical History:  Procedure Laterality Date  . BUNIONECTOMY    . CESAREAN SECTION    . LAPAROSCOPIC GASTRIC SLEEVE RESECTION N/A 09/24/2014   Procedure: LAPAROSCOPIC GASTRIC SLEEVE RESECTION upper endoscopy;  Surgeon: Johnathan Hausen, MD;  Location: [facility];  Service: General;  Laterality: N/A;  . RADIOACTIVE SEED GUIDED EXCISIONAL BREAST BIOPSY Left 03/31/2019   Procedure: RADIOACTIVE SEED GUIDED EXCISIONAL LEFT BREAST BIOPSY X2;  Surgeon: Johnathan Hausen, MD;  Location: [place];  Service: General;  Laterality: Left;  . TOTAL HIP ARTHROPLASTY Right 11/20/2014  . TOTAL HIP ARTHROPLASTY Right 11/20/2014   Procedure: TOTAL HIP ARTHROPLASTY ANTERIOR APPROACH;  Surgeon: Melrose Nakayama, MD;  Location: [facility];  Service: Orthopedics;  Laterality: Right;  . wisdom    . WISDOM TOOTH EXTRACTION       There were no vitals filed for this visit.   Subjective Assessment - 10/23/20 0933    Subjective Patient reports her pain is the same but she has not done her exercises. She reports that she avoided the exercises because she was in pain. She was advised that she should use her stretches when she is in pain. She reports the needleing worked for a day or two.    Limitations Walking    Currently in Pain? Yes    Pain Score 7     Pain Location Hip    Pain Orientation Left    Pain Descriptors / Indicators Aching    Pain Type Chronic pain    Pain Onset More than a month ago    Pain Frequency Constant    Aggravating Factors  lifting the arm    Pain Relieving Factors not mobving the arm    Multiple Pain Sites No    Pain Location Knee    Pain Orientation Right    Pain Descriptors / Indicators Aching    Pain Type Chronic pain    Pain Onset More than a month ago    Pain Frequency Constant    Aggravating Factors  stairs and getting out of the car    Pain Relieving Factors rest  [facility] Adult PT Treatment/Exercise - 10/23/20 0001      Knee/Hip Exercises: Stretches   Active Hamstring Stretch Limitations reviewed seated 3x20 sec hold    Piriformis Stretch Limitations 2x30 sec hold    Other Knee/Hip Stretches lower trunk rotation x20      Knee/Hip Exercises: Standing   Heel Raises Limitations x20 with cuing    Hip Flexion Limitations standing slow march x10 given for pool    Abduction Limitations standing, given for home x20      Knee/Hip Exercises: Supine   Other Supine Knee/Hip Exercises quad set 2x10 5 second hold    Other Supine Knee/Hip Exercises clamshell 2x10;      Manual Therapy   Manual Therapy Manual Traction    Manual therapy comments reviewed self patella mobilization    Soft tissue mobilization to gluteal and piriformis    Manual Traction LAD of the left LE            Trigger Point Dry Needling - 10/23/20 0001     Consent Given? Yes    Education Handout Provided Yes    Dry Needling Comments 2 spots to Lef tlguteal and left piriformis "warm feeling" noted    Gluteus Medius Response Twitch response elicited;Palpable increased muscle length    Piriformis Response Twitch response elicited;Palpable increased muscle length                PT Education - 10/23/20 0949    Education Details reviewed importance of perfroming exercises    Person(s) Educated Patient    Methods Explanation;Demonstration;Tactile cues;Verbal cues    Comprehension Other (comment);Verbalized understanding;Returned demonstration;Verbal cues required;Tactile cues required            PT Short Term Goals - 09/30/20 1227      PT SHORT TERM GOAL #1   Title = LTG for new cert             PT Long Term Goals - 09/30/20 1218      PT LONG TERM GOAL #1   Title Pt will be independent with final HEP    Baseline Updated 5/16    Time 8    Period Weeks    Status Revised      PT LONG TERM GOAL #2   Title Pt will improve shoulder AROM to 110 flexion, 90 ABD and IR to L1 to restore function and return independence    Baseline Met 5/16    Time 8    Period Weeks    Status Achieved      PT LONG TERM GOAL #3   Title Pt will be able to perform RUE activities for >/= 20 minutes without pain to improve function, independence and tolerance for exercise    Baseline Met 5/16    Time 8    Period Weeks    Status Achieved      PT LONG TERM GOAL #4   Title Pt will ambulate >/= 230' without antalgic gait pattern to decrease pain and improve L stance time    Baseline Antalgic gait    Time 8    Period Weeks    Status New      PT LONG TERM GOAL #5   Title Pt will be able to negotiate 4 steps with BUE assist without significant onset of pain to demonstrate functional independence.    Baseline 4 steps reciprocal pattern up/down BUE support significant increase in bilateral hip pain    Time 8    Period Weeks  Status New      PT  LONG TERM GOAL #6   Title Pt will increase single leg stance time to >/=12sec each leg to demonstrate improved LE strength and function    Baseline 4 sec RLE, 7 sec LLE    Time 8    Period Weeks    Status New      PT LONG TERM GOAL #7   Title Pt will be able to perform proper sequencing of sit to stand without UE support and no significant onset of hip pain to demonstrate improve functional independence    Baseline UE support needed and increase in hip pain    Time 8    Period Weeks    Status New                 Plan - 10/23/20 1123    Clinical Impression Statement Patient continues to have spasming in her gluteal and inot her lower back. She was encoryurged to try her stretches at home and see if any of them hurt. Therapy alos reviewed her quad set with her. She needs to get into a routine of consitetent movement, even if it is low level activity for now. We will progress as able. She was able to tolerate light stretching and exercise. We will continue to progress into exercises as tolerated. she was given a standing s3eries for the pool. She may have acsess to a  pool soon.    Personal Factors and Comorbidities Comorbidity 3+;Past/Current Experience    Comorbidities PMH: DM, HLD, depression, OA R hip and knee, R hip replacement 2016    Examination-Activity Limitations Reach Overhead;Dressing;Lift;Hygiene/Grooming;Self Feeding;Bathing;Squat;Stairs;Transfers;Locomotion Level;Bend;Carry;Bed Mobility    Examination-Participation Restrictions Cleaning;Community Activity;Driving;Laundry    Stability/Clinical Decision Making Evolving/Moderate complexity    Clinical Decision Making Moderate    Rehab Potential Good    PT Frequency 1x / week    PT Duration 8 weeks    PT Treatment/Interventions ADLs/Self Care Home Management;Moist Heat;Gait training;Stair training;Functional mobility training;Therapeutic activities;Therapeutic exercise;Balance training;Neuromuscular  re-education;Patient/family education;Manual techniques;Passive range of motion;Dry needling;Taping;Joint Manipulations;Cryotherapy;Aquatic Therapy;Ultrasound    PT Next Visit Plan How is HEP?  Transition to [place]?  Initiate strengthening program for bilateral hips - posterior hip musculature (ER tight L, R hip flexor tight, weak ABD).    PT Home Exercise Plan [email or web address]    Consulted and Agree with Plan of Care Patient           Patient will benefit from skilled therapeutic intervention in order to improve the following deficits and impairments:  Decreased mobility,Decreased range of motion,Decreased strength,Increased fascial restricitons,Pain,Difficulty walking  Visit Diagnosis: Pain in left hip  Muscle weakness (generalized)  Difficulty in walking, not elsewhere classified  Acute pain of right shoulder     Problem List Patient Active Problem List   Diagnosis Date Noted  . Pulsatile tinnitus 07/06/2018  . Primary osteoarthritis of right hip 11/20/2014  . Morbid obesity ([place]) 11/20/2014  . S/P laparoscopic sleeve gastrectomy 09/24/2014  . PCOS (polycystic ovarian syndrome) 08/02/2013  . Depressive disorder 05/26/2013  . Type 2 diabetes mellitus, uncontrolled ([place]) 05/26/2013  . Hyperlipidemia 05/26/2013  . Low back pain 05/26/2013  . Migraine 05/26/2013    Carney Living PT DPT  10/23/2020, 11:30 AM  [facility] [place], [place], [address] Phone: [phone]   Fax:  [phone]  Name: Alice Simpson MRN: [id number] Date of Birth: 06/09/1965

## 2020-10-28 ENCOUNTER — Encounter (HOSPITAL_BASED_OUTPATIENT_CLINIC_OR_DEPARTMENT_OTHER): Payer: Self-pay | Admitting: Physical Therapy

## 2020-10-28 ENCOUNTER — Ambulatory Visit (HOSPITAL_BASED_OUTPATIENT_CLINIC_OR_DEPARTMENT_OTHER): Payer: Medicaid Other | Admitting: Physical Therapy

## 2020-10-28 ENCOUNTER — Other Ambulatory Visit: Payer: Self-pay

## 2020-10-28 DIAGNOSIS — M25552 Pain in left hip: Secondary | ICD-10-CM | POA: Diagnosis not present

## 2020-10-28 DIAGNOSIS — M6281 Muscle weakness (generalized): Secondary | ICD-10-CM

## 2020-10-28 DIAGNOSIS — M25511 Pain in right shoulder: Secondary | ICD-10-CM

## 2020-10-28 DIAGNOSIS — R262 Difficulty in walking, not elsewhere classified: Secondary | ICD-10-CM

## 2020-10-28 NOTE — Therapy (Signed)
[facility] [address] [place], [place], [address] Phone: [phone]   Fax:  [phone]  Physical Therapy Treatment  Patient Details  Name: Alice Simpson MRN: [id number] Date of Birth: January 01, 1966 Referring Provider (PT): London Pepper, MD   Encounter Date: 10/28/2020   PT End of Session - 10/28/20 1534     Visit Number 6    Number of Visits 12    Date for PT Re-Evaluation 12/04/20    Authorization Type Medicaid -    Authorization Time Period 8 visits apporived until 7/17    Authorization - Visit Number 2    Authorization - Number of Visits 8    PT Start Time 2215    PT Stop Time 2258    PT Time Calculation (min) 43 min    Activity Tolerance Patient tolerated treatment well    Behavior During Therapy [facility] for tasks assessed/performed             Past Medical History:  Diagnosis Date   ADD (attention deficit disorder)    Anxiety    Arthritis    Depression    Diabetes mellitus without complication ([place])    Hyperlipidemia    Migraine    Neuropathy    Neuropathy    Osteoarthritis    PCOS (polycystic ovarian syndrome)    Pulsatile tinnitus 07/06/2018   Vertigo     Past Surgical History:  Procedure Laterality Date   BUNIONECTOMY     CESAREAN SECTION     LAPAROSCOPIC GASTRIC SLEEVE RESECTION N/A 09/24/2014   Procedure: LAPAROSCOPIC GASTRIC SLEEVE RESECTION upper endoscopy;  Surgeon: Johnathan Hausen, MD;  Location: [facility];  Service: General;  Laterality: N/A;   RADIOACTIVE SEED GUIDED EXCISIONAL BREAST BIOPSY Left 03/31/2019   Procedure: RADIOACTIVE SEED GUIDED EXCISIONAL LEFT BREAST BIOPSY X2;  Surgeon: Johnathan Hausen, MD;  Location: [facility];  Service: General;  Laterality: Left;   TOTAL HIP ARTHROPLASTY Right 11/20/2014   TOTAL HIP ARTHROPLASTY Right 11/20/2014   Procedure: TOTAL HIP ARTHROPLASTY ANTERIOR APPROACH;  Surgeon: Melrose Nakayama, MD;  Location: [facility];  Service: Orthopedics;   Laterality: Right;   wisdom     WISDOM TOOTH EXTRACTION      There were no vitals filed for this visit.   Subjective Assessment - 10/28/20 1257     Subjective Patient did a lottof exercising and walking in the pool over th eweekend. She also did a lot of yard work. She reports she was sore but it is reasonable. She is having pain across the middle of her lower back today.    Limitations Walking    Currently in Pain? Yes    Pain Score 4     Pain Location Hip    Pain Orientation Left    Pain Descriptors / Indicators Aching    Pain Type Chronic pain    Pain Onset More than a month ago    Pain Frequency Constant    Aggravating Factors  lifting the arm    Pain Relieving Factors not moving the arm    Multiple Pain Sites No                               [facility] Adult PT Treatment/Exercise - 10/28/20 0001       Self-Care   Self-Care Other Self-Care Comments    Other Self-Care Comments  therapy oragnized patients HEP and reviewed stretche vs exercises and  how to use her program      Knee/Hip Exercises: Stretches   Active Hamstring Stretch Limitations reviewed seated hamstring stretch    Piriformis Stretch Limitations reviewed both push and pull stretch 2x20 sec hold    Other Knee/Hip Stretches lower trunk rotation x20      Knee/Hip Exercises: Standing   Heel Raises Limitations reviewed for HEP    Hip Flexion Limitations reviewed for HEP      Knee/Hip Exercises: Seated   Abd/Adduction Limitations seated clamshell x20 red with cuing for posture and breathing      Knee/Hip Exercises: Supine   Other Supine Knee/Hip Exercises quad set 2x10 5 second hold    Other Supine Knee/Hip Exercises supine march 2x10;      Manual Therapy   Manual Therapy Manual Traction    Manual therapy comments reviewed self patella mobilization; skilled palpation of trigger points;    Soft tissue mobilization to gluteal and piriformis    Manual Traction LAD of the left LE               Trigger Point Dry Needling - 10/28/20 0001     Consent Given? Yes    Education Handout Provided Yes    Muscles Treated Back/Hip Lumbar multifidi    Dry Needling Comments 3 spots to left multifidi from L3-L5    Lumbar multifidi Response Twitch response elicited;Palpable increased muscle length                  PT Education - 10/28/20 1531     Education Details updated HEP;    Person(s) Educated Patient    Methods Explanation;Demonstration;Verbal cues;Tactile cues    Comprehension Verbalized understanding;Verbal cues required;Returned demonstration;Tactile cues required              PT Short Term Goals - 09/30/20 1227       PT SHORT TERM GOAL #1   Title = LTG for new cert               PT Long Term Goals - 09/30/20 1218       PT LONG TERM GOAL #1   Title Pt will be independent with final HEP    Baseline Updated 5/16    Time 8    Period Weeks    Status Revised      PT LONG TERM GOAL #2   Title Pt will improve shoulder AROM to 110 flexion, 90 ABD and IR to L1 to restore function and return independence    Baseline Met 5/16    Time 8    Period Weeks    Status Achieved      PT LONG TERM GOAL #3   Title Pt will be able to perform RUE activities for >/= 20 minutes without pain to improve function, independence and tolerance for exercise    Baseline Met 5/16    Time 8    Period Weeks    Status Achieved      PT LONG TERM GOAL #4   Title Pt will ambulate >/= 230' without antalgic gait pattern to decrease pain and improve L stance time    Baseline Antalgic gait    Time 8    Period Weeks    Status New      PT LONG TERM GOAL #5   Title Pt will be able to negotiate 4 steps with BUE assist without significant onset of pain to demonstrate functional independence.    Baseline 4 steps reciprocal pattern up/down  BUE support significant increase in bilateral hip pain    Time 8    Period Weeks    Status New      PT LONG TERM GOAL #6   Title Pt will  increase single leg stance time to >/=12sec each leg to demonstrate improved LE strength and function    Baseline 4 sec RLE, 7 sec LLE    Time 8    Period Weeks    Status New      PT LONG TERM GOAL #7   Title Pt will be able to perform proper sequencing of sit to stand without UE support and no significant onset of hip pain to demonstrate improve functional independence    Baseline UE support needed and increase in hip pain    Time 8    Period Weeks    Status New                   Plan - 10/28/20 1536     Clinical Impression Statement Patient is progressing. She had less spasming in her gluteal. Therapy perfromed dry needling on her lumbar spine. She hasd a great tiwtch respose. She also tolerated LAD well. She rpeorted improved pain with both interventions. Therapy reviewed how to use her HEP stretches v exercises. She is very motivated to continue to get stronger.    Personal Factors and Comorbidities Comorbidity 3+;Past/Current Experience    Comorbidities PMH: DM, HLD, depression, OA R hip and knee, R hip replacement 2016    Examination-Activity Limitations Reach Overhead;Dressing;Lift;Hygiene/Grooming;Self Feeding;Bathing;Squat;Stairs;Transfers;Locomotion Level;Bend;Carry;Bed Mobility    Examination-Participation Restrictions Cleaning;Community Activity;Driving;Laundry    Stability/Clinical Decision Making Evolving/Moderate complexity    Clinical Decision Making Moderate    Rehab Potential Good    PT Frequency 1x / week    PT Duration 8 weeks    PT Treatment/Interventions ADLs/Self Care Home Management;Moist Heat;Gait training;Stair training;Functional mobility training;Therapeutic activities;Therapeutic exercise;Balance training;Neuromuscular re-education;Patient/family education;Manual techniques;Passive range of motion;Dry needling;Taping;Joint Manipulations;Cryotherapy;Aquatic Therapy;Ultrasound    PT Next Visit Plan review HEP; continue to progress strengthening.  Consider UE core stability exercises; continue manual therapy.    PT Home Exercise Plan [id number]    Consulted and Agree with Plan of Care Patient             Patient will benefit from skilled therapeutic intervention in order to improve the following deficits and impairments:  Decreased mobility, Decreased range of motion, Decreased strength, Increased fascial restricitons, Pain, Difficulty walking  Visit Diagnosis: Pain in left hip  Muscle weakness (generalized)  Difficulty in walking, not elsewhere classified  Acute pain of right shoulder     Problem List Patient Active Problem List   Diagnosis Date Noted   Pulsatile tinnitus 07/06/2018   Primary osteoarthritis of right hip 11/20/2014   Morbid obesity ([place]) 11/20/2014   S/P laparoscopic sleeve gastrectomy 09/24/2014   PCOS (polycystic ovarian syndrome) 08/02/2013   Depressive disorder 05/26/2013   Type 2 diabetes mellitus, uncontrolled ([place]) 05/26/2013   Hyperlipidemia 05/26/2013   Low back pain 05/26/2013   Migraine 05/26/2013    Carney Living PT DPT  10/28/2020, 3:45 PM  [facility] [address] [place], [place], [address] Phone: [phone]   Fax:  [phone]  Name: India Jolin MRN: [id number] Date of Birth: 1965/08/01

## 2020-11-04 ENCOUNTER — Ambulatory Visit (HOSPITAL_BASED_OUTPATIENT_CLINIC_OR_DEPARTMENT_OTHER): Payer: Medicaid Other | Admitting: Physical Therapy

## 2020-11-11 ENCOUNTER — Ambulatory Visit (HOSPITAL_BASED_OUTPATIENT_CLINIC_OR_DEPARTMENT_OTHER): Payer: Medicaid Other | Admitting: Physical Therapy

## 2020-11-11 ENCOUNTER — Encounter (HOSPITAL_BASED_OUTPATIENT_CLINIC_OR_DEPARTMENT_OTHER): Payer: Self-pay | Admitting: Physical Therapy

## 2020-11-11 ENCOUNTER — Other Ambulatory Visit: Payer: Self-pay

## 2020-11-11 DIAGNOSIS — M25552 Pain in left hip: Secondary | ICD-10-CM

## 2020-11-11 DIAGNOSIS — R262 Difficulty in walking, not elsewhere classified: Secondary | ICD-10-CM

## 2020-11-11 DIAGNOSIS — M6281 Muscle weakness (generalized): Secondary | ICD-10-CM

## 2020-11-11 DIAGNOSIS — M25511 Pain in right shoulder: Secondary | ICD-10-CM

## 2020-11-11 NOTE — Therapy (Addendum)
[facility] [address] [place], [place], [address] Phone: [phone]   Fax:  [phone]  Physical Therapy Treatment/[place]ch[place]re   Patient Details  Name: Alice Simpson MRN: [id number] Date of Birth: 15-Oct-1965 Referring Provider (PT): London Pepper, MD   Encounter Date: 11/11/2020   PT End of Session - 11/11/20 1022     Visit Number 7    Number of Visits 12    Date for PT Re-Evaluation 12/04/20    Authorization Type Medicaid -    Authorization Time Period 8 visits apporived until 7/17    Authorization - Visit Number 3    Authorization - Number of Visits 8    PT Start Time 4462    PT Stop Time 1059    PT Time Calculation (min) 44 min    Activity Tolerance Patient tolerated treatment well    Behavior During Therapy [facility] for tasks assessed/performed             Past Medical History:  Diagnosis Date   ADD (attention deficit disorder)    Anxiety    Arthritis    Depression    Diabetes mellitus without complication ([place])    Hyperlipidemia    Migraine    Neuropathy    Neuropathy    Osteoarthritis    PCOS (polycystic ovarian syndrome)    Pulsatile tinnitus 07/06/2018   Vertigo     Past Surgical History:  Procedure Laterality Date   BUNIONECTOMY     CESAREAN SECTION     LAPAROSCOPIC GASTRIC SLEEVE RESECTION N/A 09/24/2014   Procedure: LAPAROSCOPIC GASTRIC SLEEVE RESECTION upper endoscopy;  Surgeon: Johnathan Hausen, MD;  Location: [facility];  Service: General;  Laterality: N/A;   RADIOACTIVE SEED GUIDED EXCISIONAL BREAST BIOPSY Left 03/31/2019   Procedure: RADIOACTIVE SEED GUIDED EXCISIONAL LEFT BREAST BIOPSY X2;  Surgeon: Johnathan Hausen, MD;  Location: [facility];  Service: General;  Laterality: Left;   TOTAL HIP ARTHROPLASTY Right 11/20/2014   TOTAL HIP ARTHROPLASTY Right 11/20/2014   Procedure: TOTAL HIP ARTHROPLASTY ANTERIOR APPROACH;  Surgeon: Melrose Nakayama, MD;  Location: [facility];  Service:  Orthopedics;  Laterality: Right;   wisdom     WISDOM TOOTH EXTRACTION      There were no vitals filed for this visit.   Subjective Assessment - 11/11/20 1022     Subjective Patient participated in pool over the weekend. Patient reports tightness in her upper back when she gets increased stressed. Patients reports she is sore but feels she is doing better than last weekend.    Limitations Walking    Currently in Pain? Yes    Pain Location Hip    Pain Orientation Left    Pain Descriptors / Indicators Aching    Pain Type Chronic pain    Pain Onset More than a month ago    Pain Frequency Constant    Aggravating Factors  standing and walking    Pain Relieving Factors not moving the arm                               [facility] Adult PT Treatment/Exercise - 11/11/20 0001       Self-Care   Other Self-Care Comments  reviewed use of thera-cane for trigger point release      Neck Exercises: Standing   Other Standing Exercises scap retraction 2x10 red; shoulder extension 2x10 red      Manual Therapy   Manual  therapy comments skilled palpation of trigger points    Soft tissue mobilization trigger point release to upper traps and cervical paraspinals; OIASTYM to lumbar spin;    Manual Traction LAD of the left LE      Neck Exercises: Stretches   Upper Trapezius Stretch 2 reps;30 seconds    Levator Stretch 2 reps;30 seconds              Trigger Point Dry Needling - 11/11/20 0001     Consent Given? Yes    Education Handout Provided Yes    Muscles Treated Back/Hip Lumbar multifidi    Dry Needling Comments 3 spots to left multifidi from L3-L5    Lumbar multifidi Response Twitch response elicited;Palpable increased muscle length                  PT Education - 11/11/20 1021     Education Details reviewed stretches and exercises    Person(s) Educated Patient    Methods Explanation;Demonstration;Tactile cues;Verbal cues    Comprehension Verbalized  understanding;Verbal cues required;Returned demonstration;Tactile cues required              PT Short Term Goals - 09/30/20 1227       PT SHORT TERM GOAL #1   Title = LTG for new cert               PT Long Term Goals - 09/30/20 1218       PT LONG TERM GOAL #1   Title Pt will be independent with final HEP    Baseline Updated 5/16    Time 8    Period Weeks    Status Revised      PT LONG TERM GOAL #2   Title Pt will improve shoulder AROM to 110 flexion, 90 ABD and IR to L1 to restore function and return independence    Baseline Met 5/16    Time 8    Period Weeks    Status Achieved      PT LONG TERM GOAL #3   Title Pt will be able to perform RUE activities for >/= 20 minutes without pain to improve function, independence and tolerance for exercise    Baseline Met 5/16    Time 8    Period Weeks    Status Achieved      PT LONG TERM GOAL #4   Title Pt will ambulate >/= 230' without antalgic gait pattern to decrease pain and improve L stance time    Baseline Antalgic gait    Time 8    Period Weeks    Status New      PT LONG TERM GOAL #5   Title Pt will be able to negotiate 4 steps with BUE assist without significant onset of pain to demonstrate functional independence.    Baseline 4 steps reciprocal pattern up/down BUE support significant increase in bilateral hip pain    Time 8    Period Weeks    Status New      PT LONG TERM GOAL #6   Title Pt will increase single leg stance time to >/=12sec each leg to demonstrate improved LE strength and function    Baseline 4 sec RLE, 7 sec LLE    Time 8    Period Weeks    Status New      PT LONG TERM GOAL #7   Title Pt will be able to perform proper sequencing of sit to stand without UE support and no significant onset  of hip pain to demonstrate improve functional independence    Baseline UE support needed and increase in hip pain    Time 8    Period Weeks    Status New                   Plan - 11/11/20  1053     Clinical Impression Statement Patients pain is moving more towards her spine. She is still sore in her gluteal but it has improved. Today we focused more on her neck and upper traps. We tried some decmpression but she reported no significant benefit. She did better with trigger point reelease to upper trap. She tolerated min to moderate pressure on the trigger point. Therapy reviewed use of thera-cane and where to buy it. She was given postural exercsies and cervical stretching to perform at home.    Personal Factors and Comorbidities Comorbidity 3+;Past/Current Experience    Comorbidities PMH: DM, HLD, depression, OA R hip and knee, R hip replacement 2016    Examination-Activity Limitations Reach Overhead;Dressing;Lift;Hygiene/Grooming;Self Feeding;Bathing;Squat;Stairs;Transfers;Locomotion Level;Bend;Carry;Bed Mobility    Examination-Participation Restrictions Cleaning;Community Activity;Driving;Laundry    Stability/Clinical Decision Making Evolving/Moderate complexity    Clinical Decision Making Moderate    Rehab Potential Good    PT Frequency 1x / week    PT Duration 8 weeks    PT Treatment/Interventions ADLs/Self Care Home Management;Moist Heat;Gait training;Stair training;Functional mobility training;Therapeutic activities;Therapeutic exercise;Balance training;Neuromuscular re-education;Patient/family education;Manual techniques;Passive range of motion;Dry needling;Taping;Joint Manipulations;Cryotherapy;Aquatic Therapy;Ultrasound    PT Next Visit Plan review HEP; continue to progress strengthening. Consider UE core stability exercises; continue manual therapy.    PT Home Exercise Plan [id number]    Consulted and Agree with Plan of Care Patient             Patient will benefit from skilled therapeutic intervention in order to improve the following deficits and impairments:  Decreased mobility, Decreased range of motion, Decreased strength, Increased fascial restricitons, Pain,  Difficulty walking  Visit Diagnosis: Pain in left hip  Muscle weakness (generalized)  Difficulty in walking, not elsewhere classified  Acute pain of right shoulder   PHYSICAL THERAPY DISCHARGE SUMMARY  Visits from Start of Care: 7  Current functional level related to goals / functional outcomes: Continued to have pain but had a full HEP    Remaining deficits: Continued pain    Education / Equipment: HEP   Patient agrees to discharge. Patient goals were not met. Patient is being discharged due to not returning since the last visit.   Problem List Patient Active Problem List   Diagnosis Date Noted   Pulsatile tinnitus 07/06/2018   Primary osteoarthritis of right hip 11/20/2014   Morbid obesity ([place]) 11/20/2014   S/P laparoscopic sleeve gastrectomy 09/24/2014   PCOS (polycystic ovarian syndrome) 08/02/2013   Depressive disorder 05/26/2013   Type 2 diabetes mellitus, uncontrolled ([place]) 05/26/2013   Hyperlipidemia 05/26/2013   Low back pain 05/26/2013   Migraine 05/26/2013    Carney Living PT DPT  11/11/2020, 2:52 PM  [facility]  11/11/2020   During this treatment session, the therapist was present, participating in and directing the treatment.   [facility] [address] [place], [place], [address] Phone: [phone]   Fax:  [phone]  Name: Alice Simpson MRN: [id number] Date of Birth: 12-18-1965

## 2020-11-22 ENCOUNTER — Ambulatory Visit (INDEPENDENT_AMBULATORY_CARE_PROVIDER_SITE_OTHER): Payer: Medicaid Other | Admitting: Internal Medicine

## 2020-11-22 ENCOUNTER — Encounter: Payer: Self-pay | Admitting: Internal Medicine

## 2020-11-22 ENCOUNTER — Other Ambulatory Visit: Payer: Self-pay

## 2020-11-22 VITALS — BP 128/90 | HR 104 | Ht 68.0 in | Wt 309.4 lb

## 2020-11-22 DIAGNOSIS — E782 Mixed hyperlipidemia: Secondary | ICD-10-CM | POA: Diagnosis not present

## 2020-11-22 DIAGNOSIS — E1165 Type 2 diabetes mellitus with hyperglycemia: Secondary | ICD-10-CM

## 2020-11-22 NOTE — Progress Notes (Signed)
Patient ID: Alice Simpson [name], female   DOB: [birth year], 55 y.o.   MRN: [id number]   This visit occurred during the SARS-CoV-2 public health emergency.  Safety protocols were in place, including screening questions prior to the visit, additional usage of staff PPE, and extensive cleaning of exam room while observing appropriate contact time as indicated for disinfecting solutions.  She  HPI: Alice Simpson is a 55 y.o.-year-old female, returning for follow-up for DM2, dx in 2007, non-insulin-dependent, uncontrolled, without long-term complications.  Last visit 6 months ago.  Interim history: No increased urination, blurry vision, nausea, chest pain. + diarrhea, gas. She has diverticulosis. She goes to [facility]) for hip pain. She also has dry needling there.  Reviewed HbA1c levels: Lab Results  Component Value Date   HGBA1C 6.2 (A) 05/28/2020   HGBA1C 6.5 (A) 01/29/2020   HGBA1C 7.4 (A) 07/25/2019   HGBA1C 8.6 (A) 04/25/2019   HGBA1C 6.9 (H) 09/24/2014   HGBA1C 6.9 (H) 09/21/2014   HGBA1C 7.5 (H) 02/22/2014   HGBA1C 7.6 (H) 11/02/2013   HGBA1C 7.7 (H) 06/09/2013  03/20/2019: HbA1c 9.2% 11/16/2018: HbA1c 8.3% 08/09/2018: HbA1c 7.4%  Pt is on a regimen of: - Metformin ER 1500 mg with dinner >> 1000 mg 2x a day with meals - Glipizide XL 5 mg before breakfast >> 5 mg 2x a day with meals - Ozempic 0.5 mg weekly >> not covered anymore >> sent  Bydureon BCise 2 mg weekly (05/2020) - was not approved... >> restarted Ozempic 0.5 mg weekly We had to stop Jardiance due to enuresis and GERD.  She was not checking sugars at last visit as she lost her meter.  Now checking 1x a day: - am: 200-240 >> 121-152 >> 115 >> 81, 114-119, 150 - 2h after b'fast: n/c >> 143 >> n/c - before lunch: n/c >> 93 >> n/c - 2h after lunch: <180 >> n/c - before dinner: n/c - 2h after dinner: <180 >> n/c - bedtime: n/c - nighttime: n/c Lowest sugar was 90 >> 80s >> 81; she has hypoglycemia awareness  in the 90s. Highest sugar was 450 (steroid inj) >> 380 (steroid inj) >> 150.  Glucometer: AccuChek x2  Pt's meals are: - Breakfast: bagel, cereal, fruit - Lunch: sandwich - Dinner: chicken, veggies She continues to see nutrition. She has a history of binge eating disorder.  -No CKD, last BUN/creatinine:  11/16/2018: 13/0.64, GFR 97, glucose 206 08/09/2018: 11/0.67, GFR 92, glucose 122 Lab Results  Component Value Date   BUN 8 07/06/2018   BUN 8 12/16/2017   CREATININE 0.69 07/06/2018   CREATININE 0.70 12/16/2017  01/13/2018: ACR 11.2 10/13/2017: ACR 19.8 On Cozaar 60.  -+ HL; last set of lipids: 06/07/2020: 214/207/77/107  Lab Results  Component Value Date   CHOL 220 (H) 07/25/2019   HDL 58.40 07/25/2019   LDLDIRECT 132.0 07/25/2019   TRIG 306.0 (H) 07/25/2019   CHOLHDL 4 07/25/2019  08/09/2018: 224/192/68/118 08/08/2013: 185/151/69/86 On Crestor 20.  - last eye exam was in 01/2020: No DR, + macular edema.  She also has vitreous detachment OU and cataracts. She is seeing Dr. Prudencio Burly.  -No numbness and tingling in her feet.  She saw podiatry for hammertoe.  No intervention needed for now.  Pt has FH of DM in M, F, other siblings.  She also has a history of breast nodules and had radioactive seed implant.  The nodules were noncancerous.  She tells me that she now again developed pain in  the left breast.  She has an appointment with OB/GYN soon. Patient has a history of PCOS diagnosed in 2007.  She was on Ortho-micronor (norethindrone) and has withdrawal bleedings every month.  Tried another OCP >> did not feel well on it.  She continues on progesterone only OCP.  She tried to stop these but she developed mood changes and she restarted. She had gastric sleeve surgery 09/2014 and she did well afterwards, losing weight and improving her diabetes.  However, she lost insurance afterwards and started to gain the weight back. She has a history of hip replacement in 2016. She had  stress-related alopecia. She also has a history of diverticulitis.  ROS: + see HPI  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   Vertigo    Past Surgical History:  Procedure Laterality Date   BUNIONECTOMY     CESAREAN SECTION     LAPAROSCOPIC GASTRIC SLEEVE RESECTION N/A 09/24/2014   Procedure: LAPAROSCOPIC GASTRIC SLEEVE RESECTION upper endoscopy;  Surgeon: Johnathan Hausen, MD;  Location: [facility];  Service: General;  Laterality: N/A;   RADIOACTIVE SEED GUIDED EXCISIONAL BREAST BIOPSY Left 03/31/2019   Procedure: RADIOACTIVE SEED GUIDED EXCISIONAL LEFT BREAST BIOPSY X2;  Surgeon: Johnathan Hausen, MD;  Location: [facility];  Service: General;  Laterality: Left;   TOTAL HIP ARTHROPLASTY Right 11/20/2014   TOTAL HIP ARTHROPLASTY Right 11/20/2014   Procedure: TOTAL HIP ARTHROPLASTY ANTERIOR APPROACH;  Surgeon: Melrose Nakayama, MD;  Location: [place];  Service: Orthopedics;  Laterality: Right;   wisdom     WISDOM TOOTH EXTRACTION     Social History   Socioeconomic History   Marital status: Single    Spouse name: Not on file   Number of children: 2   Years of education: Not on file   Highest education level: Some college, no degree  Occupational History   Occupation: Disability  Scientist, product/process development strain: Not on file   Food insecurity    Worry: Not on file    Inability: Not on file   Transportation needs    Medical: Not on file    Non-medical: Not on file  Tobacco Use   Smoking status: Current Some Day Smoker, 1 pack per month    Packs/day:     Years:     Pack years:     Types: Cigarettes    Last attempt to quit: 05/18/2004    Years since quitting: 14.9   Smokeless tobacco: Never Used   Substance and Sexual Activity   Alcohol use: Yes    Comment: rarely when does is liquor    Drug use: Yes    Types: Marijuana    Comment: 1/ every 3 months    Sexual activity: Not on file  Lifestyle   Physical activity    Days per week: Not on file    Minutes per session: Not on file   Stress: Not on file  Relationships   Social connections    Talks on phone: Not on file    Gets together: Not on file    Attends religious service: Not on file    Active member of club  or organization: Not on file    Attends meetings of clubs or organizations: Not on file    Relationship status: Not on file   Intimate partner violence    Fear of current or ex partner: Not on file    Emotionally abused: Not on file    Physically abused: Not on file    Forced sexual activity: Not on file  Other Topics Concern   Not on file  Social History Narrative   Left handed   2 cups of caffeine daily    Lives at home alone    Current Outpatient Medications on File Prior to Visit  Medication Sig Dispense Refill   busPIRone (BUSPAR) 10 MG tablet Take 10 mg by mouth 2 (two) times daily.     Calcium Citrate-Vitamin D (CALCIUM CITRATE + PO) Take 600 mg by mouth 3 (three) times daily.     DULoxetine (CYMBALTA) 30 MG capsule Take 40 mg by mouth daily.      Exenatide ER 2 MG PEN Inject under skin 2 mg weekly 12 each 3   glipiZIDE (GLUCOTROL XL) 5 MG 24 hr tablet Take 1 tablet (5 mg total) by mouth 2 (two) times daily before a meal. with food 180 tablet 3   HYDROcodone-acetaminophen (NORCO/VICODIN) 5-325 MG tablet Take 1 tablet by mouth every 6 (six) hours as needed for moderate pain. (Patient not taking: Reported on 08/15/2020) 15 tablet 0   lisdexamfetamine (VYVANSE) 10 MG capsule Take 10 mg by mouth daily. 1-2 tablets in the am (Patient not taking: Reported on 08/15/2020)     LORazepam (ATIVAN) 0.5 MG tablet Take 0.5 mg by mouth every 8 (eight) hours.     losartan (COZAAR) 25 MG tablet Take 25 mg by mouth daily.      metFORMIN (GLUCOPHAGE-XR) 500 MG 24 hr tablet Take 2 tablets (1,000 mg total) by mouth 2 (two) times daily. 360 tablet 3   Multiple Vitamin (MULTI-VITAMINS) TABS Take 1 tablet by mouth 2 (two) times daily.      norethindrone (MICRONOR,CAMILA,ERRIN) 0.35 MG tablet Take 1 tablet (0.35 mg total) by mouth daily. (Patient not taking: Reported on 08/15/2020) 1 Package 11   ondansetron (ZOFRAN) 4 MG tablet Take 1 tablet (4 mg total) by mouth every 6 (six) hours. (Patient not taking: Reported on 08/15/2020) 12 tablet 0   rosuvastatin (CRESTOR) 10 MG tablet Take 20 mg by mouth daily.      Semaglutide,0.25 or 0.5MG /DOS, (OZEMPIC, 0.25 OR 0.5 MG/DOSE,) 2 MG/1.5ML SOPN Inject 0.5 mg into the skin once a week. 4.5 mL 3   Vitamin D, Ergocalciferol, (DRISDOL) 1.25 MG (50000 UT) CAPS capsule Take 50,000 Units by mouth once a week. (Patient not taking: Reported on 08/15/2020)     No current facility-administered medications on file prior to visit.   Allergies  Allergen Reactions   Lactose Intolerance (Gi)    Penicillin G Hives    hives   Prochlorperazine Itching and Anxiety   Family History  Problem Relation Age of Onset   Diabetes Other    Depression Other    Anxiety disorder Other    High blood pressure Mother    High Cholesterol Mother    Diabetes Mother    Diabetes Father    High blood pressure Father    High Cholesterol Father    Breast cancer Neg Hx     PE: BP 128/90 (BP Location: Right Wrist, Patient Position: Sitting, Cuff Size: Normal)   Pulse (!) 104   Ht 5'  8" (1.727 m)   Wt (!) 309 lb 6.4 oz (140.3 kg)   SpO2 96%   BMI 47.04 kg/m  Wt Readings from Last 3 Encounters:  11/22/20 (!) 309 lb 6.4 oz (140.3 kg)  05/28/20 (!) 309 lb 3.2 oz (140.3 kg)  01/29/20 (!) 309 lb (140.2 kg)   Constitutional: overweight, in NAD Eyes: PERRLA, EOMI, no exophthalmos ENT: moist mucous membranes, no thyromegaly, no cervical lymphadenopathy Cardiovascular: tachycardia, RR, No MRG Respiratory: CTA  B Gastrointestinal: abdomen soft, NT, ND, BS+ Musculoskeletal: no deformities, strength intact in all 4 Skin: moist, warm, no rashes, + calluses L foot and deformity of second toe: hammer toe Neurological: no tremor with outstretched hands, DTR normal in all 4  ASSESSMENT: 1. DM2, non-insulin-dependent, uncontrolled, without long-term complications but with hyperglycemia  She does not have a family history of medullary thyroid cancer or personal history of pancreatitis.   2. HL  3.  Obesity class III  PLAN:  1. Patient with longstanding, previously uncontrolled, type 2 diabetes, on oral antidiabetic regimen with metformin and sulfonylurea, to which we kept trying to add a weekly GLP-1 receptor agonist.  She had many problems with Ozempic and then Bydureon not be covered by her insurance.  We gave her a sample of Ozempic at last visit and discussed about trying again to obtain Ozempic but I also sent a prescription for Bydureon to her pharmacy.  Previously, Bydureon BCise was not approved.  Of note, we cannot use an SGLT2 inhibitor for her.  She was on Jardiance before and developed increased GERD and enuresis.  She could not tolerate metformin due to diarrhea. -Latest HbA1c was excellent, at 6.2% on Ozempic (she ran out 2 weeks prior to the last appointment). -at today's visit, she is back on Ozempic with the help of the pharmacist at [facility]). For this to be covered, we have to keep Bydureon on her med list! -Her sugars at home appear to be mostly at goal, with only 1 exception at 150.  However, she is not checking blood sugars later in the day, and I recommended that she started doing so.  At next visit, if sugars remain controlled, I plan to reduce or even stop glipizide.  For now, we will continue the same regimen. - I suggested to:  Patient Instructions  Please continue: - Metformin 1000 mg 2x a day with meals - Glipizide XL 5 mg 2x a day 15-30 min before meals -  Ozempic 0.5 mg weekly   Please return in 4 months with your sugar log.   - we checked her HbA1c: 6.2% (stable) - advised to check sugars at different times of the day - 1x a day, rotating check times - advised for yearly eye exams >> she is UTD - she believes she had another kidney function in the last year - checked by Dr. Orland Mustard Sadie Haber).  She will try to obtain the results and send them to me. - return to clinic in 4 months  2. HL -Reviewed latest lipid panel from 06/07/2020: 214/207/77/107 >> LDL and triglycerides above target -Continues Crestor 20 mg daily without side effects  3.  Obesity class III -She has a history of gastric bypass surgery in 2016, after which her sugars improved significantly, but she was not able to follow-up with bariatrics due to loss of insurance.  He she had a hip replacement surgery and starting to gain weight afterwards. -Continue the GLP-1 receptor agonist which should also help with weight loss -  Weight remains stable  Philemon Kingdom, MD PhD [facility]

## 2020-11-22 NOTE — Patient Instructions (Addendum)
Please continue: - Metformin 1000 mg 2x a day with meals - Glipizide XL 5 mg 2x a day 15-30 min before meals - Ozempic 0.5 mg weekly   Please return in 4 months with your sugar log.

## 2020-12-10 ENCOUNTER — Other Ambulatory Visit: Payer: Self-pay | Admitting: Internal Medicine

## 2020-12-31 ENCOUNTER — Other Ambulatory Visit: Payer: Self-pay | Admitting: Surgery

## 2020-12-31 DIAGNOSIS — Z1231 Encounter for screening mammogram for malignant neoplasm of breast: Secondary | ICD-10-CM

## 2021-01-21 ENCOUNTER — Telehealth: Payer: Self-pay | Admitting: Pharmacy Technician

## 2021-01-23 ENCOUNTER — Telehealth: Payer: Self-pay | Admitting: Pharmacy Technician

## 2021-01-23 NOTE — Telephone Encounter (Addendum)
Patient Advocate Encounter   Received notification from [place] that prior authorization for [facility] is required.   PA form faxed 01/23/2021  Status is DENIED  Must have tried 2 of the preferred medications first. (Trulicity, Victoza, Byetta, Bydureon - tried this one, must try a second one).    [facility] will continue to follow   Ronney Asters, CPhT Patient Advocate [facility] Phone: [phone] Fax:  [phone]

## 2021-01-28 ENCOUNTER — Ambulatory Visit: Payer: Medicaid Other

## 2021-01-28 ENCOUNTER — Other Ambulatory Visit: Payer: Self-pay | Admitting: Internal Medicine

## 2021-01-28 MED ORDER — TRULICITY 1.5 MG/0.5ML ~~LOC~~ SOAJ: 1.5000 mg | SUBCUTANEOUS | 3 refills | Status: DC

## 2021-01-28 NOTE — Telephone Encounter (Signed)
OK, I sent Trulicity instead.

## 2021-01-29 NOTE — Telephone Encounter (Signed)
Called and left a detailed message advising pt of medication change and why.

## 2021-02-26 ENCOUNTER — Ambulatory Visit: Payer: Medicaid Other

## 2021-03-01 ENCOUNTER — Ambulatory Visit
Admission: RE | Admit: 2021-03-01 | Discharge: 2021-03-01 | Disposition: A | Discharge status: Home / Self Care | Payer: Medicaid Other | Source: Ambulatory Visit | Attending: Surgery | Admitting: Surgery

## 2021-03-01 DIAGNOSIS — Z1231 Encounter for screening mammogram for malignant neoplasm of breast: Secondary | ICD-10-CM

## 2021-03-24 ENCOUNTER — Ambulatory Visit: Payer: Medicaid Other | Admitting: Internal Medicine

## 2021-03-27 ENCOUNTER — Encounter: Payer: Self-pay | Admitting: Internal Medicine

## 2021-03-28 ENCOUNTER — Other Ambulatory Visit: Payer: Self-pay

## 2021-03-28 ENCOUNTER — Encounter: Payer: Self-pay | Admitting: Internal Medicine

## 2021-03-28 ENCOUNTER — Ambulatory Visit (INDEPENDENT_AMBULATORY_CARE_PROVIDER_SITE_OTHER): Payer: Medicaid Other | Admitting: Internal Medicine

## 2021-03-28 VITALS — BP 128/88 | HR 101 | Ht 68.0 in | Wt 311.4 lb

## 2021-03-28 DIAGNOSIS — E1165 Type 2 diabetes mellitus with hyperglycemia: Secondary | ICD-10-CM

## 2021-03-28 DIAGNOSIS — E782 Mixed hyperlipidemia: Secondary | ICD-10-CM

## 2021-03-28 LAB — POCT GLYCOSYLATED HEMOGLOBIN (HGB A1C): Hemoglobin A1C: 6.7 % — AB (ref 4.0–5.6)

## 2021-03-28 MED ORDER — OZEMPIC (1 MG/DOSE) 4 MG/3ML ~~LOC~~ SOPN: 1.0000 mg | PEN_INJECTOR | SUBCUTANEOUS | 3 refills | Status: DC

## 2021-03-28 NOTE — Progress Notes (Signed)
Patient ID: Alice Simpson, female   DOB: 1966-03-04, 55 y.o.   MRN: [id number]   This visit occurred during the SARS-CoV-2 public health emergency.  Safety protocols were in place, including screening questions prior to the visit, additional usage of staff PPE, and extensive cleaning of exam room while observing appropriate contact time as indicated for disinfecting solutions.  She  HPI: Alice Simpson is a 55 y.o.-year-old female, returning for follow-up for DM2, dx in 2007, non-insulin-dependent, uncontrolled, without long-term complications.  Last visit 6 months ago.  Interim history: No increased urination, blurry vision, nausea, chest pain.   She had to physical therapy for hip pain. She had a steroid inj in her R knee today. Sees ortho. To have R TKR, she needs a BMI <40.  She would like to join the weight management clinic at [facility].  Reviewed HbA1c levels: Lab Results  Component Value Date   HGBA1C 6.2 (A) 05/28/2020   HGBA1C 6.5 (A) 01/29/2020   HGBA1C 7.4 (A) 07/25/2019   HGBA1C 8.6 (A) 04/25/2019   HGBA1C 6.9 (H) 09/24/2014   HGBA1C 6.9 (H) 09/21/2014   HGBA1C 7.5 (H) 02/22/2014   HGBA1C 7.6 (H) 11/02/2013   HGBA1C 7.7 (H) 06/09/2013  03/20/2019: HbA1c 9.2% 11/16/2018: HbA1c 8.3% 08/09/2018: HbA1c 7.4%  Pt is on a regimen of: - Metformin ER 1500 mg with dinner >> 1000 mg 2x a day with meals - Glipizide XL 5 mg before breakfast >> 5 mg 2x a day with meals - Ozempic 0.5 mg weekly >> not covered anymore >> sent  Bydureon BCise 2 mg weekly (05/2020) - was not approved... >> restarted Ozempic 0.5 mg weekly -was able to take it consistently for the last 7 weeks. We had to stop Jardiance due to enuresis and GERD.  She is not checking sugars as her strips have expired.  From previously: - am: 200-240 >> 121-152 >> 115 >> 81, 114-119, 150 - 2h after b'fast: n/c >> 143 >> n/c - before lunch: n/c >> 93 >> n/c - 2h after lunch: <180 >> n/c -  before dinner: n/c - 2h after dinner: <180 >> n/c - bedtime: n/c - nighttime: n/c Lowest sugar was 90 >> 80s >> 81 >> ?; she has hypoglycemia awareness in the 90s. Highest sugar was 450 (steroid inj) >> 380 (steroid inj) >> 150 >> ?Marland Kitchen  Glucometer: AccuChek x2  Pt's meals are: - Breakfast: bagel, cereal, fruit - Lunch: sandwich - Dinner: chicken, veggies She continues to see nutrition. She has a history of binge eating disorder.  -No CKD, last BUN/creatinine:  05/29/2020: 11/0.69 11/16/2018: 13/0.64, GFR 97, glucose 206 08/09/2018: 11/0.67, GFR 92, glucose 122 Lab Results  Component Value Date   BUN 8 07/06/2018   BUN 8 12/16/2017   CREATININE 0.69 07/06/2018   CREATININE 0.70 12/16/2017  01/13/2018: ACR 11.2 10/13/2017: ACR 19.8 On Cozaar 60.  -+ HL; last set of lipids: 05/29/2020: 214/207/77/107  Lab Results  Component Value Date   CHOL 220 (H) 07/25/2019   HDL 58.40 07/25/2019   LDLDIRECT 132.0 07/25/2019   TRIG 306.0 (H) 07/25/2019   CHOLHDL 4 07/25/2019  08/09/2018: 224/192/68/118 08/08/2013: 185/151/69/86 On Crestor 20.  - last eye exam was on 01/27/2021: No DR, no macular edema reportedly.  She also has vitreous detachment OU and cataracts. She is seeing Dr. Prudencio Burly.  -No numbness and tingling in her feet.  She saw podiatry for hammertoe.  No intervention needed for now.  Pt has FH of  DM in M, F, other siblings.  She also has a history of breast nodules and had radioactive seed implant.  The nodules were noncancerous.  She tells me that she now again developed pain in the left breast.  She has an appointment with OB/GYN soon. Patient has a history of PCOS diagnosed in 2007.  She was on Ortho-micronor (norethindrone) and has withdrawal bleedings every month.  Tried another OCP >> did not feel well on it.  She continues on progesterone only OCP.  She tried to stop these but she developed mood changes and she restarted. She had gastric sleeve surgery 09/2014 and she did well  afterwards, losing weight and improving her diabetes.  However, she lost insurance afterwards and started to gain the weight back. She has a history of hip replacement in 2016. She had stress-related alopecia. She also has a history of diverticulitis.  ROS: + see HPI  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   Vertigo    Past Surgical History:  Procedure Laterality Date   BUNIONECTOMY     CESAREAN SECTION     LAPAROSCOPIC GASTRIC SLEEVE RESECTION N/A 09/24/2014   Procedure: LAPAROSCOPIC GASTRIC SLEEVE RESECTION upper endoscopy;  Surgeon: Johnathan Hausen, MD;  Location: [facility];  Service: General;  Laterality: N/A;   RADIOACTIVE SEED GUIDED EXCISIONAL BREAST BIOPSY Left 03/31/2019   Procedure: RADIOACTIVE SEED GUIDED EXCISIONAL LEFT BREAST BIOPSY X2;  Surgeon: Johnathan Hausen, MD;  Location: [place];  Service: General;  Laterality: Left;   TOTAL HIP ARTHROPLASTY Right 11/20/2014   TOTAL HIP ARTHROPLASTY Right 11/20/2014   Procedure: TOTAL HIP ARTHROPLASTY ANTERIOR APPROACH;  Surgeon: Melrose Nakayama, MD;  Location: [place];  Service: Orthopedics;  Laterality: Right;   wisdom     WISDOM TOOTH EXTRACTION     Social History   Socioeconomic History   Marital status: Single    Spouse name: Not on file   Number of children: 2   Years of education: Not on file   Highest education level: Some college, no degree  Occupational History   Occupation: Disability  Scientist, product/process development strain: Not on file   Food insecurity    Worry: Not on file    Inability: Not on file   Transportation needs    Medical: Not on file    Non-medical: Not on file  Tobacco Use   Smoking status: Current Some Day  Smoker, 1 pack per month    Packs/day:     Years:     Pack years:     Types: Cigarettes    Last attempt to quit: 05/18/2004    Years since quitting: 14.9   Smokeless tobacco: Never Used  Substance and Sexual Activity   Alcohol use: Yes    Comment: rarely when does is liquor    Drug use: Yes    Types: Marijuana    Comment: 1/ every 3 months    Sexual activity: Not on file  Lifestyle   Physical activity    Days per week: Not on file    Minutes per session: Not on file   Stress: Not on file  Relationships  Social Herbalist on phone: Not on file    Gets together: Not on file    Attends religious service: Not on file    Active member of club or organization: Not on file    Attends meetings of clubs or organizations: Not on file    Relationship status: Not on file   Intimate partner violence    Fear of current or ex partner: Not on file    Emotionally abused: Not on file    Physically abused: Not on file    Forced sexual activity: Not on file  Other Topics Concern   Not on file  Social History Narrative   Left handed   2 cups of caffeine daily    Lives at home alone    Current Outpatient Medications on File Prior to Visit  Medication Sig Dispense Refill   busPIRone (BUSPAR) 10 MG tablet Take 10 mg by mouth 2 (two) times daily.     Calcium Citrate-Vitamin D (CALCIUM CITRATE + PO) Take 600 mg by mouth 3 (three) times daily.     Dulaglutide (TRULICITY) 1.5 VO/5.3GU SOPN Inject 1.5 mg into the skin once a week. 6 mL 3   DULoxetine (CYMBALTA) 30 MG capsule Take 40 mg by mouth daily.      Exenatide ER 2 MG PEN Inject under skin 2 mg weekly 12 each 3   glipiZIDE (GLUCOTROL XL) 5 MG 24 hr tablet TAKE 1 TABLET BY MOUTH 2 TIMES DAILY BEFORE A MEAL. TAKE WITH FOOD 180 tablet 3   LORazepam (ATIVAN) 0.5 MG tablet Take 0.5 mg by mouth every 8 (eight) hours.     losartan (COZAAR) 25 MG tablet Take 25 mg by mouth daily.     metFORMIN (GLUCOPHAGE-XR) 500 MG 24 hr tablet TAKE 2  TABLETS BY MOUTH 2 TIMES DAILY 360 tablet 3   Multiple Vitamin (MULTI-VITAMINS) TABS Take 1 tablet by mouth 2 (two) times daily.      rosuvastatin (CRESTOR) 10 MG tablet Take 20 mg by mouth daily.      No current facility-administered medications on file prior to visit.   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   Lactose Intolerance (Gi)    Penicillin G Hives    hives   Prochlorperazine Itching and Anxiety   Family History  Problem Relation Age of Onset   Diabetes Other    Depression Other    Anxiety disorder Other    High blood pressure Mother    High Cholesterol Mother    Diabetes Mother    Diabetes Father    High blood pressure Father    High Cholesterol Father    Breast cancer Neg Hx     PE: BP 128/88 (BP Location: Right Wrist, Patient Position: Sitting, Cuff Size: Normal)   Pulse (!) 101   Ht 5\' 8"  (1.727 m)   Wt (!) 311 lb 6.4 oz (141.3 kg)   SpO2 96%   BMI 47.35 kg/m  Wt Readings from Last 3 Encounters:  03/28/21 (!) 311 lb 6.4 oz (141.3 kg)  11/22/20 (!) 309 lb 6.4 oz (140.3 kg)  05/28/20 (!) 309 lb 3.2 oz (140.3 kg)   Constitutional: overweight, in NAD Eyes: PERRLA, EOMI, no exophthalmos ENT: moist mucous membranes, no thyromegaly, no cervical lymphadenopathy Cardiovascular: Tachycardia, RR, No MRG Respiratory: CTA B Gastrointestinal: abdomen soft, NT, ND, BS+ Musculoskeletal: no deformities, strength intact in  all 4 Skin: moist, warm, no rashes, + calluses L foot and deformity of second toe: hammer toe Neurological: no tremor with outstretched hands, DTR normal in all 4  ASSESSMENT: 1. DM2, non-insulin-dependent, uncontrolled, without long-term complications but with hyperglycemia  She does not have a family history of medullary thyroid cancer or personal history of pancreatitis.   2. HL  3.  Obesity class III  PLAN:  1. Patient with longstanding, previously  uncontrolled, type 2 diabetes, on oral antidiabetic regimen with metformin and sulfonylurea and also weekly GLP-1 receptor agonist.  She had many problems obtaining Ozempic as this was not covered by her insurance.  We tried Bydureon, [employer] but finally she was able to obtain Ozempic with the help [facility]).  For this to be covered, we had to keep Bydureon on her medication list.   -After starting Ozempic, her blood sugars improved.  Of note, we could not use SGLT2 inhibitor.  She developed increased GERD and enuresis on Jardiance.  She could not tolerate metformin due to diarrhea. -At this visit, she tells me she had absolutely needs to lose weight to improve her knee status and to see if she can possibly undergo surgery.  In this context, since she is tolerating Ozempic well, I advised her to increase the dose to 1 mg weekly.  If this is not covered by her insurance (Medicaid), she can use that 0.5 mg dose x2 a week.  We did discuss that Darcel Bayley is stronger than Ozempic but not covered by [facility] for now.  I advised her to wait until she is seen in the weight management clinic to see if she can obtain this. -At this visit, she is not checking blood sugars at all and actually advised her to start.  She will send me the name of the test strips that she needs  so I can call them in - I suggested to:  Patient Instructions  Please continue: - Metformin 1000 mg 2x a day with meals - Glipizide XL 5 mg 2x a day 15-30 min before meals  Please increase: - Ozempic 1 mg weekly   Please return in 4 months with your sugar log.   - we checked her HbA1c: 6.7% (higher) - advised to check sugars at different times of the day - 1x a day, rotating check times - advised for yearly eye exams >> she is not UTD - return to clinic in 4 months  2. HL -Reviewed lipid panel from 05/2020: 214/207/77/107: LDL and triglycerides were above target -She continues on Crestor 20  mg daily-no side effects  3.  Obesity class III -She has a history of gastric bypass surgery in 2016, after which her sugars improved significantly, but she was not able to follow-up with bariatrics due to loss of insurance.  She started to gain weight afterwards, especially after hip replacement surgery. -As of now, we have her on a GLP-1 receptor agonist which should also help with weight loss >> will increase the dose today -We discussed about Mounjaro as being stronger than Ozempic in terms of both diabetes control and weight loss -At last visit, weight was stable; since then, she gained 2 pounds  Philemon Kingdom, MD PhD [facility] Endocrinology

## 2021-03-28 NOTE — Patient Instructions (Addendum)
Please continue: - Metformin 1000 mg 2x a day with meals - Glipizide XL 5 mg 2x a day 15-30 min before meals  Please increase: - Ozempic 1 mg weekly   Please return in 4 months with your sugar log.

## 2021-07-07 ENCOUNTER — Encounter (HOSPITAL_BASED_OUTPATIENT_CLINIC_OR_DEPARTMENT_OTHER): Payer: Self-pay | Admitting: Physical Therapy

## 2021-07-31 ENCOUNTER — Encounter: Payer: Self-pay | Admitting: Internal Medicine

## 2021-07-31 ENCOUNTER — Other Ambulatory Visit: Payer: Self-pay

## 2021-07-31 ENCOUNTER — Ambulatory Visit: Payer: Medicaid Other | Admitting: Internal Medicine

## 2021-07-31 VITALS — BP 118/82 | HR 112 | Ht 69.0 in | Wt 295.0 lb

## 2021-07-31 DIAGNOSIS — E1165 Type 2 diabetes mellitus with hyperglycemia: Secondary | ICD-10-CM | POA: Diagnosis not present

## 2021-07-31 DIAGNOSIS — E782 Mixed hyperlipidemia: Secondary | ICD-10-CM

## 2021-07-31 LAB — POCT GLYCOSYLATED HEMOGLOBIN (HGB A1C): Hemoglobin A1C: 5.8 % — AB (ref 4.0–5.6)

## 2021-07-31 NOTE — Progress Notes (Signed)
Patient ID: Alice Simpson, female   DOB: 1965-12-12, 56 y.o.   MRN: [id number]  ? ?This visit occurred during the SARS-CoV-2 public health emergency.  Safety protocols were in place, including screening questions prior to the visit, additional usage of staff PPE, and extensive cleaning of exam room while observing appropriate contact time as indicated for disinfecting solutions.  She ? ?HPI: ?Alice Simpson is a 56 y.o.-year-old female, returning for follow-up for DM2, dx in 2007, non-insulin-dependent, uncontrolled, without long-term complications.  Last visit 4 months ago. ? ?Interim history: ?No increased urination, blurry vision, nausea, chest pain.  She does have hot flashes. ?She previously had physical therapy for hip pain, but this continues ?She continues to have steroid injections in her right knee.  She sees Ortho. To have R TKR, she needs a BMI <40.  At last visit she was planning to join the weight management clinic at [facility]. She did not join yet. ?Alice Simpson, she lost 16 pounds since last visit. ? ?Reviewed HbA1c levels: ?Lab Results  ?Component Value Date  ? HGBA1C 6.7 (A) 03/28/2021  ? HGBA1C 6.2 (A) 05/28/2020  ? HGBA1C 6.5 (A) 01/29/2020  ? HGBA1C 7.4 (A) 07/25/2019  ? HGBA1C 8.6 (A) 04/25/2019  ? HGBA1C 6.9 (H) 09/24/2014  ? HGBA1C 6.9 (H) 09/21/2014  ? HGBA1C 7.5 (H) 02/22/2014  ? HGBA1C 7.6 (H) 11/02/2013  ? HGBA1C 7.7 (H) 06/09/2013  ?03/20/2019: HbA1c 9.2% ?11/16/2018: HbA1c 8.3% ?08/09/2018: HbA1c 7.4% ? ?Pt is on a regimen of: ?- Metformin ER 1500 mg with dinner >> 1000 mg 2x a day with meals ?- Glipizide XL 5 mg before breakfast >> 5 mg 2x a day with meals ?- Ozempic 0.5 mg weekly >> not covered anymore >> sent  Bydureon BCise 2 mg weekly (05/2020) - was not approved... >> restarted Ozempic 0.5 mg weekly >> 1 mg weekly ?We had to stop Jardiance due to enuresis and GERD. ? ?She is checking sugars 0 to once a day: ?- am: 200-240 >> 121-152 >> 115 >> 81, 114-119,  150 >> 76-103 ?- 2h after b'fast: n/c >> 143 >> n/c ?- before lunch: n/c >> 93 >> n/c >> 113 ?- 2h after lunch: <180 >> n/c ?- before dinner: n/c >> 80 ?- 2h after dinner: <180 >> n/c >> 139 ?- bedtime: n/c ?- nighttime: n/c ?Lowest sugar was 90 >> 80s >> 81 >> 76; she has hypoglycemia awareness in the 90s. ?Highest sugar was 450 (steroid inj) >> 380 (steroid inj) >> 150 >> 139. ? ?Glucometer: AccuChek x2 ? ?Pt's meals are: ?- Breakfast: bagel, cereal, fruit ?- Lunch: sandwich ?- Dinner: chicken, veggies ?She continues to see nutrition. She has a history of binge eating disorder. ? ?-No CKD, last BUN/creatinine:  ?06/26/2021: 9/0.72, GFR 98, glucose 85, ACR 5.5 ?05/29/2020: 11/0.69 ?11/16/2018: 13/0.64, GFR 97, glucose 206 ?08/09/2018: 11/0.67, GFR 92, glucose 122 ?Lab Results  ?Component Value Date  ? BUN 8 07/06/2018  ? BUN 8 12/16/2017  ? CREATININE 0.69 07/06/2018  ? CREATININE 0.70 12/16/2017  ?01/13/2018: ACR 11.2 ?10/13/2017: ACR 19.8 ?On Cozaar 60. ? ?-+ HL; last set of lipids: ?06/26/2021: 211/207/67/109 ?05/29/2020: 214/207/77/107  ?Lab Results  ?Component Value Date  ? CHOL 220 (H) 07/25/2019  ? HDL 58.40 07/25/2019  ? LDLDIRECT 132.0 07/25/2019  ? TRIG 306.0 (H) 07/25/2019  ? CHOLHDL 4 07/25/2019  ?08/09/2018: 224/192/68/118 ?08/08/2013: 185/151/69/86 ?On Crestor 20. ? ?- last eye exam was on 01/27/2021: No DR, no macular edema reportedly.  She also has vitreous detachment OU and cataracts. She is seeing Dr. Prudencio Simpson. ? ?-No numbness and tingling in her feet.  She saw podiatry for hammertoe.  Last foot exam 08/19/2020. ? ?Pt has FH of DM in M, F, other siblings. ? ?She also has a history of breast nodules and had radioactive seed implant.  The nodules were noncancerous.  She tells me that she now again developed pain in the left breast.  She has an appointment with OB/GYN soon. ?Patient has a history of PCOS diagnosed in 2007.  She was on Ortho-micronor (norethindrone) and has withdrawal bleedings every month.  Tried  another OCP >> did not feel well on it.  She was on progesterone only OCP.  She tried to stop these but she developed mood changes and she restarted. Now off again. ?She had gastric sleeve surgery 09/2014 and she did well afterwards, losing weight and improving her diabetes.  However, she lost insurance afterwards and started to gain the weight back. ?She has a history of hip replacement in 2016. ?She had stress-related alopecia. ?She also has a history of diverticulitis. ? ?06/26/2021: TSH 2.34 ? ?ROS: ?+ see HPI ? ?Past Medical History:  ?Diagnosis Date  ? ADD (attention deficit disorder)   ? Anxiety   ? Arthritis   ? Depression   ? Diabetes mellitus without complication ([place])   ? Hyperlipidemia   ? Migraine   ? Neuropathy   ? Neuropathy   ? Osteoarthritis   ? PCOS (polycystic ovarian syndrome)   ? Pulsatile tinnitus 07/06/2018  ? Vertigo   ? ?Past Surgical History:  ?Procedure Laterality Date  ? BUNIONECTOMY    ? CESAREAN SECTION    ? LAPAROSCOPIC GASTRIC SLEEVE RESECTION N/A 09/24/2014  ? Procedure: LAPAROSCOPIC GASTRIC SLEEVE RESECTION upper endoscopy;  Surgeon: Alice Hausen, MD;  Location: [facility];  Service: General;  Laterality: N/A;  ? RADIOACTIVE SEED GUIDED EXCISIONAL BREAST BIOPSY Left 03/31/2019  ? Procedure: RADIOACTIVE SEED GUIDED EXCISIONAL LEFT BREAST BIOPSY X2;  Surgeon: Alice Hausen, MD;  Location: [place];  Service: General;  Laterality: Left;  ? TOTAL HIP ARTHROPLASTY Right 11/20/2014  ? TOTAL HIP ARTHROPLASTY Right 11/20/2014  ? Procedure: TOTAL HIP ARTHROPLASTY ANTERIOR APPROACH;  Surgeon: Alice Nakayama, MD;  Location: [place];  Service: Orthopedics;  Laterality: Right;  ? wisdom    ? WISDOM TOOTH EXTRACTION    ? ?Social History  ? ?Socioeconomic History  ? Marital status: Single  ?  Spouse name: Not on file  ? Number of children: 2  ? Years of education: Not on file  ? Highest education level: Some college, no degree  ?Occupational History  ? Occupation: Disability  ?Social Needs   ? Financial resource strain: Not on file  ? Food insecurity  ?  Worry: Not on file  ?  Inability: Not on file  ? Transportation needs  ?  Medical: Not on file  ?  Non-medical: Not on file  ?Tobacco Use  ? Smoking status: Current Some Day Smoker, 1 pack per month  ?  Packs/day:   ?  Years:   ?  Pack years:   ?  Types: Cigarettes  ?  Last attempt to quit: 05/18/2004  ?  Years since quitting: 14.9  ? Smokeless tobacco: Never Used  ?Substance and Sexual Activity  ? Alcohol use: Yes  ?  Comment: rarely when does is liquor   ? Drug use: Yes  ?  Types: Marijuana  ?  Comment: 1/  every 3 months   ? Sexual activity: Not on file  ?Lifestyle  ? Physical activity  ?  Days per week: Not on file  ?  Minutes per session: Not on file  ? Stress: Not on file  ?Relationships  ? Social connections  ?  Talks on phone: Not on file  ?  Gets together: Not on file  ?  Attends religious service: Not on file  ?  Active member of club or organization: Not on file  ?  Attends meetings of clubs or organizations: Not on file  ?  Relationship status: Not on file  ? Intimate partner violence  ?  Fear of current or ex partner: Not on file  ?  Emotionally abused: Not on file  ?  Physically abused: Not on file  ?  Forced sexual activity: Not on file  ?Other Topics Concern  ? Not on file  ?Social History Narrative  ? Left handed  ? 2 cups of caffeine daily   ? Lives at home alone   ? ?Current Outpatient Medications on File Prior to Visit  ?Medication Sig Dispense Refill  ? busPIRone (BUSPAR) 10 MG tablet Take 10 mg by mouth 2 (two) times daily.    ? DULoxetine (CYMBALTA) 30 MG capsule Take 40 mg by mouth daily.     ? glipiZIDE (GLUCOTROL XL) 5 MG 24 hr tablet TAKE 1 TABLET BY MOUTH 2 TIMES DAILY BEFORE A MEAL. TAKE WITH FOOD 180 tablet 3  ? LORazepam (ATIVAN) 0.5 MG tablet Take 0.5 mg by mouth every 8 (eight) hours.    ? losartan (COZAAR) 25 MG tablet Take 25 mg by mouth daily.    ? metFORMIN (GLUCOPHAGE-XR) 500 MG 24 hr tablet TAKE 2 TABLETS BY MOUTH 2  TIMES DAILY 360 tablet 3  ? Multiple Vitamin (MULTI-VITAMINS) TABS Take 1 tablet by mouth 2 (two) times daily.     ? rosuvastatin (CRESTOR) 10 MG tablet Take 20 mg by mouth daily.     ? Semaglutide, 1 MG

## 2021-07-31 NOTE — Patient Instructions (Addendum)
Please continue: ?- Metformin 1000 mg 2x a day with meals ?- Ozempic 1 mg weekly  ? ?Please decrease: ?- Glipizide XL 5 mg in am  ? ?If sugars remain controlled >> stop Glipizide.  ? ?Please return in 6 months with your sugar log.  ?

## 2021-11-17 ENCOUNTER — Other Ambulatory Visit (HOSPITAL_COMMUNITY)
Admission: RE | Admit: 2021-11-17 | Discharge: 2021-11-17 | Disposition: A | Discharge status: Home / Self Care | Payer: Medicaid Other | Source: Ambulatory Visit | Attending: Obstetrics and Gynecology | Admitting: Obstetrics and Gynecology

## 2021-11-17 DIAGNOSIS — Z01419 Encounter for gynecological examination (general) (routine) without abnormal findings: Secondary | ICD-10-CM | POA: Diagnosis present

## 2021-11-20 LAB — CYTOLOGY - PAP
Comment: NEGATIVE
Diagnosis: NEGATIVE
High risk HPV: NEGATIVE

## 2021-12-09 ENCOUNTER — Other Ambulatory Visit: Payer: Self-pay | Admitting: Internal Medicine

## 2021-12-15 ENCOUNTER — Other Ambulatory Visit: Payer: Self-pay | Admitting: Internal Medicine

## 2022-02-02 ENCOUNTER — Encounter: Payer: Self-pay | Admitting: Internal Medicine

## 2022-02-02 ENCOUNTER — Ambulatory Visit: Payer: Medicaid Other | Admitting: Internal Medicine

## 2022-02-02 VITALS — BP 130/72 | HR 72 | Ht 69.0 in | Wt 279.4 lb

## 2022-02-02 DIAGNOSIS — E782 Mixed hyperlipidemia: Secondary | ICD-10-CM

## 2022-02-02 DIAGNOSIS — E1165 Type 2 diabetes mellitus with hyperglycemia: Secondary | ICD-10-CM | POA: Diagnosis not present

## 2022-02-02 LAB — POCT GLYCOSYLATED HEMOGLOBIN (HGB A1C): Hemoglobin A1C: 5.8 % — AB (ref 4.0–5.6)

## 2022-02-02 MED ORDER — METFORMIN HCL ER 500 MG PO TB24: 1000.0000 mg | ORAL_TABLET | Freq: Two times a day (BID) | ORAL | 3 refills | Status: DC

## 2022-02-02 NOTE — Progress Notes (Signed)
Patient ID: Alice Simpson, female   DOB: 1966-01-03, 56 y.o.   MRN: [id number]   HPI: Alice Simpson is a 56 y.o.-year-old female, returning for follow-up for DM2, dx in 2007, non-insulin-dependent, now controlled, without long-term complications.  Last visit 6 months ago.  Interim history: No increased urination, blurry vision, nausea, chest pain.  She does have hot flashes. She continues to have hip pain.  She also continues to have knee pain for which she gets steroid injections. She sees Ortho.  She needs to have R TKR, she needs a BMI <40.  She has AP 2/2 diverticulitis. She sees GI. May need to have a new colonoscopy. Over the summer, she traveled to [place] and also to [place] to house sit.  Reviewed HbA1c levels: Lab Results  Component Value Date   HGBA1C 5.8 (A) 07/31/2021   HGBA1C 6.7 (A) 03/28/2021   HGBA1C 6.2 (A) 05/28/2020   HGBA1C 6.5 (A) 01/29/2020   HGBA1C 7.4 (A) 07/25/2019   HGBA1C 8.6 (A) 04/25/2019   HGBA1C 6.9 (H) 09/24/2014   HGBA1C 6.9 (H) 09/21/2014   HGBA1C 7.5 (H) 02/22/2014   HGBA1C 7.6 (H) 11/02/2013  03/20/2019: HbA1c 9.2% 11/16/2018: HbA1c 8.3% 08/09/2018: HbA1c 7.4%  Pt is on a regimen of: - Metformin ER 1500 mg with dinner >> 1000 mg 2x a day with meals - Glipizide XL 5 mg before breakfast >> 5 mg 2x a day with meals >> 5 mg in am  - Ozempic 0.5 mg weekly >> not covered anymore >> sent  Bydureon BCise 2 mg weekly (05/2020) - was not approved... >> restarted Ozempic 0.5 mg weekly >> 1 mg weekly We had to stop Jardiance due to enuresis and GERD.  She is not checking sugars now. From last OV: - am: 200-240 >> 121-152 >> 115 >> 81, 114-119, 150 >> 76-103 - 2h after b'fast: n/c >> 143 >> n/c - before lunch: n/c >> 93 >> n/c >> 113 - 2h after lunch: <180 >> n/c - before dinner: n/c >> 80 - 2h after dinner: <180 >> n/c >> 139 - bedtime: n/c - nighttime: n/c Lowest sugar was 90 >> 80s >> 81 >> 76 >> ?; she has hypoglycemia  awareness in the 90s. Highest sugar was 450 (steroid inj) >> 380 (steroid inj) >> 150 >> 139 >> ?.  Glucometer: AccuChek x2  Pt's meals are: - Breakfast: bagel, cereal, fruit - Lunch: sandwich - Dinner: chicken, veggies She continues to see nutrition. She has a history of binge eating disorder.  -No CKD, last BUN/creatinine:  06/26/2021: 9/0.72, GFR 98, glucose 85, ACR 5.5 05/29/2020: 11/0.69 11/16/2018: 13/0.64, GFR 97, glucose 206 08/09/2018: 11/0.67, GFR 92, glucose 122 Lab Results  Component Value Date   BUN 8 07/06/2018   BUN 8 12/16/2017   CREATININE 0.69 07/06/2018   CREATININE 0.70 12/16/2017  01/13/2018: ACR 11.2 10/13/2017: ACR 19.8 On Cozaar 60.  -+ HL; last set of lipids: 06/26/2021: 211/207/67/109 05/29/2020: 214/207/77/107  Lab Results  Component Value Date   CHOL 220 (H) 07/25/2019   HDL 58.40 07/25/2019   LDLDIRECT 132.0 07/25/2019   TRIG 306.0 (H) 07/25/2019   CHOLHDL 4 07/25/2019  08/09/2018: 224/192/68/118 08/08/2013: 185/151/69/86 On Crestor 20.  - last eye exam was on 01/2022: No DR, no macular edema reportedly.  She also has vitreous detachment OU and cataracts. She is seeing Dr. Prudencio Burly.  -No numbness and tingling in her feet.  She saw podiatry for hammertoe.  Last foot exam 08/19/2020.  Pt has FH of DM in M, F, other siblings.  She also has a history of breast nodules and had radioactive seed implant.  The nodules were noncancerous.  She tells me that she now again developed pain in the left breast.  She has an appointment with OB/GYN soon. Patient has a history of PCOS diagnosed in 2007.  She was on Ortho-micronor (norethindrone) and has withdrawal bleedings every month.  Tried another OCP >> did not feel well on it.  She was on progesterone only OCP.  She tried to stop these but she developed mood changes and she restarted. Now off again. She had gastric sleeve surgery 09/2014 and she did well afterwards, losing weight and improving her diabetes.  However,  she lost insurance afterwards and started to gain the weight back. She has a history of hip replacement in 2016. She had stress-related alopecia. She also has a history of diverticulitis.  06/26/2021: TSH 2.34  ROS: + see HPI  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   Vertigo    Past Surgical History:  Procedure Laterality Date   BUNIONECTOMY     CESAREAN SECTION     LAPAROSCOPIC GASTRIC SLEEVE RESECTION N/A 09/24/2014   Procedure: LAPAROSCOPIC GASTRIC SLEEVE RESECTION upper endoscopy;  Surgeon: Johnathan Hausen, MD;  Location: [facility];  Service: General;  Laterality: N/A;   RADIOACTIVE SEED GUIDED EXCISIONAL BREAST BIOPSY Left 03/31/2019   Procedure: RADIOACTIVE SEED GUIDED EXCISIONAL LEFT BREAST BIOPSY X2;  Surgeon: Johnathan Hausen, MD;  Location: [facility];  Service: General;  Laterality: Left;   TOTAL HIP ARTHROPLASTY Right 11/20/2014   TOTAL HIP ARTHROPLASTY Right 11/20/2014   Procedure: TOTAL HIP ARTHROPLASTY ANTERIOR APPROACH;  Surgeon: Melrose Nakayama, MD;  Location: [facility];  Service: Orthopedics;  Laterality: Right;   wisdom     WISDOM TOOTH EXTRACTION     Social History   Socioeconomic History   Marital status: Single    Spouse name: Not on file   Number of children: 2   Years of education: Not on file   Highest education level: Some college, no degree  Occupational History   Occupation: Disability  Scientist, product/process development strain: Not on file   Food insecurity    Worry: Not on file    Inability: Not on file   Transportation needs    Medical: Not on file    Non-medical: Not on file  Tobacco Use   Smoking status: Current Some Day Smoker, 1 pack per month    Packs/day:      Years:     Pack years:     Types: Cigarettes    Last attempt to quit: 05/18/2004    Years since quitting: 14.9   Smokeless tobacco: Never Used  Substance and Sexual Activity   Alcohol use: Yes    Comment: rarely when does is liquor    Drug use: Yes    Types: Marijuana    Comment: 1/ every 3 months    Sexual activity: Not on file  Lifestyle   Physical activity    Days per week: Not on file    Minutes per session: Not  on file   Stress: Not on file  Relationships   Social connections    Talks on phone: Not on file    Gets together: Not on file    Attends religious service: Not on file    Active member of club or organization: Not on file    Attends meetings of clubs or organizations: Not on file    Relationship status: Not on file   Intimate partner violence    Fear of current or ex partner: Not on file    Emotionally abused: Not on file    Physically abused: Not on file    Forced sexual activity: Not on file  Other Topics Concern   Not on file  Social History Narrative   Left handed   2 cups of caffeine daily    Lives at home alone    Current Outpatient Medications on File Prior to Visit  Medication Sig Dispense Refill   busPIRone (BUSPAR) 10 MG tablet Take 10 mg by mouth 2 (two) times daily.     DULoxetine (CYMBALTA) 30 MG capsule Take 40 mg by mouth daily.      GLIPIZIDE XL 5 MG 24 hr tablet TAKE 1 TABLET BY MOUTH 2 TIMES DAILY BEFORE MEALS 180 tablet 0   LORazepam (ATIVAN) 0.5 MG tablet Take 0.5 mg by mouth every 8 (eight) hours.     losartan (COZAAR) 25 MG tablet Take 25 mg by mouth daily.     metFORMIN (GLUCOPHAGE-XR) 500 MG 24 hr tablet TAKE 2 TABLETS BY MOUTH 2 TIMES DAILY 360 tablet 0   Multiple Vitamin (MULTI-VITAMINS) TABS Take 1 tablet by mouth 2 (two) times daily.      OZEMPIC, 1 MG/DOSE, 4 MG/3ML SOPN INJECT '1mg'$  into THE SKIN ONCE WEEKLY 9 mL 3   rosuvastatin (CRESTOR) 10 MG tablet Take 20 mg by mouth daily.      No current facility-administered medications  on file prior to visit.   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[place] due to frequently coughing   Lactose Intolerance (Gi)    Penicillin G Hives    hives   Prochlorperazine Itching and Anxiety   Family History  Problem Relation Age of Onset   Diabetes Other    Depression Other    Anxiety disorder Other    High blood pressure Mother    High Cholesterol Mother    Diabetes Mother    Diabetes Father    High blood pressure Father    High Cholesterol Father    Breast cancer Neg Hx     PE: BP 130/72 (BP Location: Right Wrist, Patient Position: Sitting, Cuff Size: Normal)   Pulse 72   Ht '5\' 9"'$  (1.753 m)   Wt 279 lb 6.4 oz (126.7 kg)   SpO2 96%   BMI 41.26 kg/m  Wt Readings from Last 3 Encounters:  02/02/22 279 lb 6.4 oz (126.7 kg)  07/31/21 295 lb (133.8 kg)  03/28/21 (!) 311 lb 6.4 oz (141.3 kg)   Constitutional: overweight, in NAD Eyes: PERRLA, EOMI, no exophthalmos ENT: moist mucous membranes, no thyromegaly, no cervical lymphadenopathy Cardiovascular: Tachycardia, RR, No MRG Respiratory: CTA B Musculoskeletal: no deformities, strength intact in all 4 Skin: moist, warm, no rashes, + calluses L foot and deformity of second toe: hammer toe Neurological: no tremor with outstretched hands, DTR normal in all 4 Diabetic Foot Exam - Simple   Simple Foot Form Diabetic Foot exam  was performed with the following findings: Yes 02/02/2022  1:15 PM  Visual Inspection See comments: Yes Sensation Testing Intact to touch and monofilament testing bilaterally: Yes Pulse Check Posterior Tibialis and Dorsalis pulse intact bilaterally: Yes Comments + hammer toe L  2nd toe     ASSESSMENT: 1. DM2, non-insulin-dependent, uncontrolled, without long-term complications but with hyperglycemia  She does not have a family history of medullary thyroid cancer or personal history of pancreatitis.   2. HL  3.  Obesity class III  PLAN:   1. Patient with longstanding, previously uncontrolled diabetes, but with much better control lately, especially after being able to start weekly GLP-1 receptor agonist Ozempic.  She also continues on metformin and sulfonylurea.  We can not use an SGLT2 inhibitor.  Her because she developed increased GERD and enuresis on Jardiance. -At last visit, sugars were all at goal, excellent, mostly lower than 100 in the morning.  We discussed about stopping glipizide before dinner and continuing only with the morning tablet.  However, I did advise her that if the sugars remain well controlled also stop this.  She had initially nausea with Ozempic but now tolerated well.  She does have lower abdominal pain, which she feels is related to her history of diverticulitis.  This is relieved by bowel movements.  She is seeing GI and need to have a colonoscopy soon. -At today's visit, she is not checking blood sugars.  I again advised her to start.  However, based on the excellent HbA1c, I suggested to continue to de-escalate her regimen, for now by stopping glipizide.  We will continue the rest of the regimen.  - I suggested to:  Patient Instructions  Please continue: - Metformin 1000 mg 2x a day with meals - Glipizide XL 5 mg in am - Ozempic 1 mg weekly   Please return in 6 months with your sugar log.   - we checked her HbA1c: 5.8% (stable, at goal) - advised to check sugars at different times of the day - 1x a day, rotating check times - advised for yearly eye exams >> she is UTD - return to clinic in 6 months  2. HL -Reviewed lipid panel from 06/26/2021: LDL above target, triglycerides high, but improved: 211/207/67/109 -She continues on Crestor 20 mg daily without side effects  3.  Obesity class III -Patient has a history of gastric bypass surgery in 2016, after which her sugars improved significantly but she was not able to follow-up with bariatrics due to loss of insurance.  She started to gain weight  afterwards, especially after her hip replacement surgery. -We now have her on a GLP-1 receptor agonist which should also help with weight loss -She lost approximately 16 pounds before last visit and 16 more since then. She needs to weigh <260 lbs for the sx.  Philemon Kingdom, MD PhD [facility] Endocrinology

## 2022-02-02 NOTE — Patient Instructions (Addendum)
Please continue: - Metformin 1000 mg 2x a day with meals - Ozempic 1 mg weekly   Stop Glipizide.  Please return in 6 months with your sugar log.

## 2022-02-17 ENCOUNTER — Other Ambulatory Visit: Payer: Self-pay | Admitting: Family Medicine

## 2022-02-17 DIAGNOSIS — Z1231 Encounter for screening mammogram for malignant neoplasm of breast: Secondary | ICD-10-CM

## 2022-03-13 ENCOUNTER — Ambulatory Visit: Payer: Medicaid Other

## 2022-04-21 ENCOUNTER — Ambulatory Visit: Payer: Medicaid Other

## 2022-06-17 ENCOUNTER — Ambulatory Visit
Admission: RE | Admit: 2022-06-17 | Discharge: 2022-06-17 | Disposition: A | Discharge status: Home / Self Care | Payer: Medicaid Other | Source: Ambulatory Visit | Attending: Family Medicine | Admitting: Family Medicine

## 2022-06-17 DIAGNOSIS — Z1231 Encounter for screening mammogram for malignant neoplasm of breast: Secondary | ICD-10-CM

## 2022-07-16 ENCOUNTER — Ambulatory Visit: Payer: Medicaid Other | Admitting: Internal Medicine

## 2022-07-16 ENCOUNTER — Encounter: Payer: Self-pay | Admitting: Internal Medicine

## 2022-07-16 VITALS — BP 128/84 | HR 97 | Ht 69.0 in | Wt 273.6 lb

## 2022-07-16 DIAGNOSIS — E782 Mixed hyperlipidemia: Secondary | ICD-10-CM

## 2022-07-16 DIAGNOSIS — E1165 Type 2 diabetes mellitus with hyperglycemia: Secondary | ICD-10-CM | POA: Diagnosis not present

## 2022-07-16 LAB — POCT GLYCOSYLATED HEMOGLOBIN (HGB A1C): Hemoglobin A1C: 6 % — AB (ref 4.0–5.6)

## 2022-07-16 MED ORDER — ACCU-CHEK GUIDE VI STRP: ORAL_STRIP | 3 refills | Status: AC

## 2022-07-16 MED ORDER — ACCU-CHEK GUIDE W/DEVICE KIT: PACK | 0 refills | Status: AC

## 2022-07-16 MED ORDER — ACCU-CHEK SOFTCLIX LANCETS MISC: 3 refills | Status: AC

## 2022-07-16 NOTE — Patient Instructions (Addendum)
Please continue: - Metformin 1000 mg 2x a day with meals - Ozempic 1 mg weekly   Please return in 6 months with your sugar log.

## 2022-07-16 NOTE — Progress Notes (Signed)
Patient ID: Alice Simpson, female   DOB: 11-17-1965, 57 y.o.   MRN: [id number]   HPI: [place] is a 57 y.o.-year-old female, returning for follow-up for DM2, dx in 2007, non-insulin-dependent, now controlled, without long-term complications.  Last visit 6 months ago.  Interim history: No increased urination, blurry vision, nausea, chest pain.   She continues to have hip pain.  She also continues to have knee pain for which she gets steroid injections. She sees Ortho.  She needs to have R TKR, she needs a BMI <40.  She has AP 2/2 suspected diverticulitis.  She sees gastroenterology. She is on ABx prn. On Benefiber. She had a Lp (a) checked recently before starting the study and this was in the 300s.  Reviewed HbA1c levels: Lab Results  Component Value Date   HGBA1C 5.8 (A) 02/02/2022   HGBA1C 5.8 (A) 07/31/2021   HGBA1C 6.7 (A) 03/28/2021   HGBA1C 6.2 (A) 05/28/2020   HGBA1C 6.5 (A) 01/29/2020   HGBA1C 7.4 (A) 07/25/2019   HGBA1C 8.6 (A) 04/25/2019   HGBA1C 6.9 (H) 09/24/2014   HGBA1C 6.9 (H) 09/21/2014   HGBA1C 7.5 (H) 02/22/2014  03/20/2019: HbA1c 9.2% 11/16/2018: HbA1c 8.3% 08/09/2018: HbA1c 7.4%  Pt is on a regimen of: - Metformin ER 1500 mg with dinner >> 1000 mg 2x a day with meals - Ozempic 0.5 mg weekly >> not covered anymore >> sent  Bydureon BCise 2 mg weekly (05/2020) - was not approved... >> restarted Ozempic 0.5 mg weekly >> 1 mg weekly We had to stop Jardiance due to enuresis and GERD. We stopped glipizide XL 01/2022.  She is not checking sugars now. From  before: - am: 200-240 >> ... 81, 114-119, 150 >> 76-103 - 2h after b'fast: n/c >> 143 >> n/c - before lunch: n/c >> 93 >> n/c >> 113 - 2h after lunch: <180 >> n/c - before dinner: n/c >> 80 - 2h after dinner: <180 >> n/c >> 139 - bedtime: n/c - nighttime: n/c Lowest sugar was 90 >> 80s >> 81 >> 76 >> ?; she has hypoglycemia awareness in the 90s. Highest sugar was 450 (steroid inj) >> 380  (steroid inj) >> 150 >> 139 >> ?.  Glucometer: AccuChek x2  Pt's meals are: - Breakfast: bagel, cereal, fruit - Lunch: sandwich - Dinner: chicken, veggies She continues to see nutrition. She has a history of binge eating disorder.  -No CKD, last BUN/creatinine:  07/16/2022: Glucose 97 70-99 mg/dL  BUN 16 6-26 mg/dL  Creatinine 0.72 0.60-1.30 mg/dl  eGFR2021 98 >60 calc  Sodium 140 136-145 mmol/L  Potassium 4.4 3.5-5.5 mmol/L  Chloride 104 98-107 mmol/L  CO2 30 22-32 mmol/L  Anion Gap 9.9 6.0-20.0 mmol/L  Calcium 10.1 8.6-10.3 mg/dL  CA-corrected 9.81 8.60-10.30 mg/dL  Protein, Total 7.0 6.0-8.3 g/dL  Albumin 4.4 3.4-4.8 g/dL  TBIL 1.0 0.3-1.0 mg/dL  ALP 76 38-126 U/L  AST 17 0-39 U/L  ALT 13 0-52 U/L   MA/CR ratio 6.1 0.0-30.0 mg/G    UMA 0.89 0.00-1.90 mg/dL    UCR 146      06/26/2021: 9/0.72, GFR 98, glucose 85, ACR 5.5 05/29/2020: 11/0.69 11/16/2018: 13/0.64, GFR 97, glucose 206 08/09/2018: 11/0.67, GFR 92, glucose 122 Lab Results  Component Value Date   BUN 8 07/06/2018   BUN 8 12/16/2017   CREATININE 0.69 07/06/2018   CREATININE 0.70 12/16/2017  01/13/2018: ACR 11.2 10/13/2017: ACR 19.8 On Cozaar 60.  -+ HL; last set of lipids: 07/16/2022:  Cholesterol 212 <200 mg/dL    CHOL/HDL 2.7 2.0-4.0 Ratio    HDLD 80 30-85 mg/dL Values below 40 mg/dL indicate increased risk factor  Triglyceride 132 0-199 mg/dL    NHDL 133 0-129 mg/dL Range dependent upon risk factors.  LDL Chol Calc (NIH) 110 0-99 mg/dL    06/26/2021: 211/207/67/109 05/29/2020: 214/207/77/107  Lab Results  Component Value Date   CHOL 220 (H) 07/25/2019   HDL 58.40 07/25/2019   LDLDIRECT 132.0 07/25/2019   TRIG 306.0 (H) 07/25/2019   CHOLHDL 4 07/25/2019  08/09/2018: 224/192/68/118 08/08/2013: 185/151/69/86 On Crestor 20.  - last eye exam was on 01/2022: No DR, no macular edema reportedly.  She also has vitreous detachment OU and cataracts. She is seeing Dr. Prudencio Burly.  -No numbness and tingling in her  feet.  She saw podiatry for hammertoe.  Last foot exam 02/02/2022.  Pt has FH of DM in M, F, other siblings.  She also has a history of breast nodules and had radioactive seed implant.  The nodules were noncancerous.  She tells me that she now again developed pain in the left breast.  She has an appointment with OB/GYN soon. Patient has a history of PCOS diagnosed in 2007.  She was on Ortho-micronor (norethindrone) and has withdrawal bleedings every month.  Tried another OCP >> did not feel well on it.  She was on progesterone only OCP.  She tried to stop these but she developed mood changes and she restarted. Now off again. She had gastric sleeve surgery 09/2014 and she did well afterwards, losing weight and improving her diabetes.  However, she lost insurance afterwards and started to gain the weight back. She has a history of hip replacement in 2016. She had stress-related alopecia.  06/26/2021: TSH 2.34  ROS: + see HPI  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   Vertigo    Past Surgical History:  Procedure Laterality Date   BUNIONECTOMY     CESAREAN SECTION     LAPAROSCOPIC GASTRIC SLEEVE RESECTION N/A 09/24/2014   Procedure: LAPAROSCOPIC GASTRIC SLEEVE RESECTION upper endoscopy;  Surgeon: Johnathan Hausen, MD;  Location: [facility];  Service: General;  Laterality: N/A;   RADIOACTIVE SEED GUIDED EXCISIONAL BREAST BIOPSY Left 03/31/2019   Procedure: RADIOACTIVE SEED GUIDED EXCISIONAL LEFT BREAST BIOPSY X2;  Surgeon: Johnathan Hausen, MD;  Location: [place];  Service: General;  Laterality: Left;   TOTAL HIP ARTHROPLASTY Right 11/20/2014   TOTAL HIP ARTHROPLASTY Right 11/20/2014   Procedure: TOTAL HIP  ARTHROPLASTY ANTERIOR APPROACH;  Surgeon: Melrose Nakayama, MD;  Location: [place];  Service: Orthopedics;  Laterality: Right;   wisdom     WISDOM TOOTH EXTRACTION     Social History   Socioeconomic History   Marital status: Single    Spouse name: Not on file   Number of children: 2   Years of education: Not on file   Highest education level: Some college, no degree  Occupational History   Occupation: Disability  Social Designer, fashion/clothing strain: Not on file   Food insecurity    Worry: Not on file    Inability: Not on file  Transportation needs    Medical: Not on file    Non-medical: Not on file  Tobacco Use   Smoking status: Current Some Day Smoker, 1 pack per month    Packs/day:     Years:     Pack years:     Types: Cigarettes    Last attempt to quit: 05/18/2004    Years since quitting: 14.9   Smokeless tobacco: Never Used  Substance and Sexual Activity   Alcohol use: Yes    Comment: rarely when does is liquor    Drug use: Yes    Types: Marijuana    Comment: 1/ every 3 months    Sexual activity: Not on file  Lifestyle   Physical activity    Days per week: Not on file    Minutes per session: Not on file   Stress: Not on file  Relationships   Social connections    Talks on phone: Not on file    Gets together: Not on file    Attends religious service: Not on file    Active member of club or organization: Not on file    Attends meetings of clubs or organizations: Not on file    Relationship status: Not on file   Intimate partner violence    Fear of current or ex partner: Not on file    Emotionally abused: Not on file    Physically abused: Not on file    Forced sexual activity: Not on file  Other Topics Concern   Not on file  Social History Narrative   Left handed   2 cups of caffeine daily    Lives at home alone    Current Outpatient Medications on File Prior to Visit  Medication Sig Dispense Refill   busPIRone (BUSPAR) 10 MG tablet Take 10 mg by  mouth 2 (two) times daily.     DULoxetine (CYMBALTA) 30 MG capsule Take 40 mg by mouth daily.      LORazepam (ATIVAN) 0.5 MG tablet Take 0.5 mg by mouth every 8 (eight) hours.     losartan (COZAAR) 25 MG tablet Take 25 mg by mouth daily.     metFORMIN (GLUCOPHAGE-XR) 500 MG 24 hr tablet Take 2 tablets (1,000 mg total) by mouth 2 (two) times daily. 360 tablet 3   Multiple Vitamin (MULTI-VITAMINS) TABS Take 1 tablet by mouth 2 (two) times daily.      OZEMPIC, 1 MG/DOSE, 4 MG/3ML SOPN INJECT '1mg'$  into THE SKIN ONCE WEEKLY 9 mL 3   rosuvastatin (CRESTOR) 10 MG tablet Take 20 mg by mouth daily.      No current facility-administered medications on file prior to visit.   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   Lactose Intolerance (Gi)    Penicillin G Hives    hives   Prochlorperazine Itching and Anxiety   Family History  Problem Relation Age of Onset   Diabetes Other    Depression Other    Anxiety disorder Other    High blood pressure Mother    High Cholesterol Mother    Diabetes Mother    Diabetes Father    High blood pressure Father    High Cholesterol Father    Breast cancer Neg Hx     PE: BP 128/84 (BP Location: Right Wrist, Patient Position: Sitting, Cuff Size: Normal)   Pulse 97   Ht '5\' 9"'$  (1.753 m)  Wt 273 lb 9.6 oz (124.1 kg)   SpO2 98%   BMI 40.40 kg/m  Wt Readings from Last 3 Encounters:  07/16/22 273 lb 9.6 oz (124.1 kg)  02/02/22 279 lb 6.4 oz (126.7 kg)  07/31/21 295 lb (133.8 kg)   Constitutional: overweight, in NAD Eyes: EOMI, no exophthalmos ENT: no thyromegaly, no cervical lymphadenopathy Cardiovascular: Tachycardia, RR, No MRG Respiratory: CTA B Musculoskeletal: no deformities Skin: no rashes, + calluses L foot and deformity of second toe: hammer toe Neurological: no tremor with outstretched hands  ASSESSMENT: 1. DM2, non-insulin-dependent, uncontrolled, without  long-term complications but with hyperglycemia  She does not have a family history of medullary thyroid cancer or personal history of pancreatitis.   2. HL  3.  Obesity class III  PLAN:  1. Patient with longstanding, previously uncontrolled type 2 diabetes, but with better control lately especially after being able to start a GLP-1 receptor agonist.  She continues on metformin and weekly GLP-1 receptor agonist.  We tried an SGLT2 inhibitor but she developed increased GERD and enuresis on Jardiance.  At last visit, she was not checking blood sugars and I advised her to start.  Based on the excellent HbA1c, 5.8%, I recommended to stop glipizide but we continue the rest of the regimen. -At today's visit, she is not checking blood sugars.  We sent a prescription for a new meter to her pharmacy.  I advised her to start checking once a day along rotating check times.  She agrees to do so.  For now, especially in the light of the very good HbA1c, we can continue the current regimen. - I suggested to:  Patient Instructions  Please continue: - Metformin 1000 mg 2x a day with meals - Ozempic 1 mg weekly   Please return in 6 months with your sugar log.   - we checked her HbA1c: 6% (sightly higher) - advised to check sugars at different times of the day - 1x a day, rotating check times - advised for yearly eye exams >> she is UTD - return to clinic in 6 months  2. HL -Reviewed latest lipid panel from 06/2022 (from this morning): LDL above goal. -She continues on Crestor 20 mg daily without side effects, however, she discussed with PCP that she may need to increase the dose of Crestor to 40 mg daily.  She will discussed with him.   -She has a very high Lp(a).  She is wondering whether she should see cardiology.  I feel that this could be a good idea especially since she does have diabetes and also family h/o heart ds.  3.  Obesity class III -She has a history of gastric bypass in 2016, after which  her sugars improved significantly but she was not able to follow-up with bariatrics due to loss of insurance.  She started to gain weight afterwards, especially after her hip replacement surgery. -We now have her on Ozempic, which should also help with weight loss -She lost 32 pounds before the last 2 visits combined. Since then, lost 6 more lbs. - She needs to weigh <260 lbs for right TKR.  Philemon Kingdom, MD PhD [facility] Endocrinology

## 2022-08-03 ENCOUNTER — Ambulatory Visit: Payer: Medicaid Other | Admitting: Internal Medicine

## 2022-08-03 ENCOUNTER — Encounter: Payer: Self-pay | Admitting: Internal Medicine

## 2022-08-03 VITALS — BP 129/84 | HR 71 | Resp 16 | Ht 69.0 in | Wt 274.2 lb

## 2022-08-03 DIAGNOSIS — I10 Essential (primary) hypertension: Secondary | ICD-10-CM

## 2022-08-03 DIAGNOSIS — R0789 Other chest pain: Secondary | ICD-10-CM

## 2022-08-03 DIAGNOSIS — E782 Mixed hyperlipidemia: Secondary | ICD-10-CM

## 2022-08-03 NOTE — Progress Notes (Signed)
Primary Physician/Referring:  London Pepper, MD  Patient ID: Alice Simpson, female    DOB: Oct 01, 1965, 57 y.o.   MRN: [id number]  Chief Complaint  Patient presents with   Hyperlipidemia   Hypertension   New Patient (Initial Visit)    Referred by London Pepper, MD   HPI:    Alice Simpson  is a 57 y.o. with a significant PMH but not limited to, DM II, hyperlipidemia, HTN, obesity-binge eating disorder s/p gastric sleeve 2015, POCS, GERD, gallstones, and diverticulosis. She is currently still smoking, did quit for 13 years. She presents today to establish care and evaluation/management of statin therapy.   Latest Lipid panel Chol 212, TG 132, HDL 80, LDL 110. She was previously on Rosuvastatin 20 mg PO daily and it was increased to 40 mg PO daily. Pt does have a significant family PMH including valve replacement, afib, and other "heart issues". She is was also on Losartan 25 mg but stopped taking the medication as she felt that she did not need to continue. Since seeing her PCP, she has restarted taking her Losartan 25 mg PO HS. She is just concerned about her heart health and would like to complete a work up to make sure she is on top of her healt. Today she shortness of breath, palpitations, leg edema, orthopnea, PND, TIA/syncope. She admits chest pain that is intermittent, sharp in nature that last seconds and then goes away. She says this has only happened a few times. She admits to a sedentary lifestyle, secondary to chronic back and knee pain. She is working with a PT to get her BMI goal for a knee replacement. She endorses one episode of SBP 100 after waking up and feeling tired and weak. She does not check BP prior to taking her medications. She has had a sleep study done before and she states she was "negative for sleep apnea".    Past Medical History:  Diagnosis Date   ADD (attention deficit disorder)    Anxiety    Arthritis    Depression    Diabetes mellitus  without complication (HCC)    Hyperlipidemia    Migraine    Neuropathy    Neuropathy    Osteoarthritis    PCOS (polycystic ovarian syndrome)    Pulsatile tinnitus 07/06/2018   Vertigo    Past Surgical History:  Procedure Laterality Date   BUNIONECTOMY     CESAREAN SECTION     LAPAROSCOPIC GASTRIC SLEEVE RESECTION N/A 09/24/2014   Procedure: LAPAROSCOPIC GASTRIC SLEEVE RESECTION upper endoscopy;  Surgeon: Johnathan Hausen, MD;  Location: [facility];  Service: General;  Laterality: N/A;   RADIOACTIVE SEED GUIDED EXCISIONAL BREAST BIOPSY Left 03/31/2019   Procedure: RADIOACTIVE SEED GUIDED EXCISIONAL LEFT BREAST BIOPSY X2;  Surgeon: Johnathan Hausen, MD;  Location: [place];  Service: General;  Laterality: Left;   TOTAL HIP ARTHROPLASTY Right 11/20/2014   TOTAL HIP ARTHROPLASTY Right 11/20/2014   Procedure: TOTAL HIP ARTHROPLASTY ANTERIOR APPROACH;  Surgeon: Melrose Nakayama, MD;  Location: [place];  Service: Orthopedics;  Laterality: Right;   wisdom     WISDOM TOOTH EXTRACTION     Family History  Problem Relation Age of Onset   Heart disease Mother    High blood pressure Mother    High Cholesterol Mother    Diabetes Mother    Atrial fibrillation Mother    Diabetes Father    High blood pressure Father    High Cholesterol Father  Diabetes Other    Depression Other    Anxiety disorder Other    Heart disease Half-Sister    Heart attack Half-Sister    Breast cancer Neg Hx     Social History   Tobacco Use   Smoking status: Some Days    Packs/day: 1.00    Years: 25.00    Additional pack years: 0.00    Total pack years: 25.00    Types: Cigarettes    Last attempt to quit: 05/18/2004    Years since quitting: 18.2   Smokeless tobacco: Never   Tobacco comments:    Patient had quit smoking for 13 years and then started back.   Substance Use Topics   Alcohol use: Yes    Comment: rarely when does is liquor    Marital Status: Single  ROS   Review of Systems   Constitutional: Negative.   Respiratory: Negative.  Negative for shortness of breath.   Cardiovascular:  Negative for chest pain, palpitations, orthopnea, claudication, leg swelling and PND.  Musculoskeletal:  Negative for myalgias.  Neurological: Negative.   Psychiatric/Behavioral: Negative.      Objective  Blood pressure 129/84, pulse 71, resp. rate 16, height 5\' 9"  (1.753 m), weight 274 lb 3.2 oz (124.4 kg), SpO2 97 %. Body mass index is 40.49 kg/m.     08/03/2022    9:59 AM 07/16/2022    2:20 PM 02/02/2022    1:04 PM  Vitals with BMI  Height 5\' 9"  5\' 9"  5\' 9"   Weight 274 lbs 3 oz 273 lbs 10 oz 279 lbs 6 oz  BMI 40.47 0000000 [id number]  Systolic [id number]  Diastolic 84 84 72  Pulse 71 97 72    Physical Exam Cardiovascular:     Rate and Rhythm: Normal rate and regular rhythm.     Pulses: Normal pulses.     Heart sounds: No murmur heard.    No friction rub. No gallop.  Pulmonary:     Effort: Pulmonary effort is normal.     Breath sounds: Normal breath sounds.  Abdominal:     Palpations: Abdomen is soft.  Musculoskeletal:     Right lower leg: No edema.     Left lower leg: No edema.  Skin:    General: Skin is warm and dry.  Neurological:     Mental Status: She is alert. Mental status is at baseline.  Psychiatric:        Mood and Affect: Mood normal.     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   Lactose Intolerance (Gi)    Penicillin G Hives    hives   Prochlorperazine Itching and Anxiety     Medication list after today's encounter   Current Outpatient Medications:    Accu-Chek Softclix Lancets lancets, Use as instructed to check blood sugar 1X daily, Disp: 100 each, Rfl: 3   Blood Glucose Monitoring Suppl (ACCU-CHEK GUIDE) w/Device KIT, Use as instructed to check blood sugar 1X daily, Disp: 1 kit, Rfl: 0   busPIRone (BUSPAR) 15 MG tablet, Take 15 mg by  mouth 2 (two) times daily., Disp: , Rfl:    DULoxetine HCl 40 MG CPEP, Take 40 mg by mouth daily., Disp: , Rfl:    glucose blood (ACCU-CHEK GUIDE) test strip, Use as instructed to check blood sugar 1X daily, Disp: 100 each, Rfl: 3  LORazepam (ATIVAN) 0.5 MG tablet, Take 0.5 mg by mouth every 8 (eight) hours., Disp: , Rfl:    losartan (COZAAR) 25 MG tablet, Take 25 mg by mouth daily., Disp: , Rfl:    metFORMIN (GLUCOPHAGE-XR) 500 MG 24 hr tablet, Take 2 tablets (1,000 mg total) by mouth 2 (two) times daily., Disp: 360 tablet, Rfl: 3   Multiple Vitamin (MULTI-VITAMINS) TABS, Take 1 tablet by mouth 2 (two) times daily. Prenatal, Disp: , Rfl:    OZEMPIC, 1 MG/DOSE, 4 MG/3ML SOPN, INJECT 1mg  into THE SKIN ONCE WEEKLY, Disp: 9 mL, Rfl: 3   rosuvastatin (CRESTOR) 20 MG tablet, Take 20 mg by mouth daily., Disp: , Rfl:   Laboratory examination:   Lab Results  Component Value Date   NA 140 07/06/2018   K 4.6 07/06/2018   CO2 23 07/06/2018   GLUCOSE 145 (H) 07/06/2018   BUN 8 07/06/2018   CREATININE 0.69 07/06/2018   CALCIUM 9.8 07/06/2018   GFRNONAA 100 07/06/2018       Latest Ref Rng & Units 07/06/2018    9:12 AM 12/16/2017    2:25 PM 11/21/2014    6:04 AM  CMP  Glucose 65 - 99 mg/dL 145  142  150   BUN 6 - 24 mg/dL 8  8  <5   Creatinine 0.57 - 1.00 mg/dL 0.69  0.70  0.70   Sodium 134 - 144 mmol/L 140  138  135   Potassium 3.5 - 5.2 mmol/L 4.6  4.0  4.2   Chloride 96 - 106 mmol/L 102  100  100   CO2 20 - 29 mmol/L 23  26  28    Calcium 8.7 - 10.2 mg/dL 9.8  9.3  8.7   Total Protein 6.0 - 8.5 g/dL 7.1  8.9    Total Bilirubin 0.0 - 1.2 mg/dL 0.4  1.1    Alkaline Phos 39 - 117 IU/L 94  65    AST 0 - 40 IU/L 27  15    ALT 0 - 32 IU/L 25  15        Latest Ref Rng & Units 12/16/2017    2:25 PM 11/23/2014    4:50 AM 11/22/2014    4:10 AM  CBC  WBC 4.0 - 10.5 K/uL 16.2  12.0  13.6   Hemoglobin 12.0 - 15.0 g/dL 13.2  9.2  9.6   Hematocrit 36.0 - 46.0 % 42.2  28.1  29.1   Platelets 150 - 400  K/uL 367  275  273     Lipid Panel 07/16/2022: Chol 212, TG 132, HDL 80, LDL 110  HEMOGLOBIN A1C Lab Results  Component Value Date   HGBA1C 6.0 (A) 07/16/2022   MPG 151 09/24/2014   TSH No results for input(s): "TSH" in the last 8760 hours.  External labs:  07/16/2022 Glucose 97, BUN/Cr 16/0.72. EGFR 98. Na/K 140/4.4. Rest of the CMP normal H/H 13.7/41.3. MCV 83.4. Platelets 320  Radiology:    Cardiac Studies:   No results found for this or any previous visit from the past 1095 days.     No results found for this or any previous visit from the past 1095 days.   EKG:   Sinus Rhythm, rate 70 bpm. Normal R wave progression, normal axis. No ischemia.  Assessment     ICD-10-CM   1. Mixed hyperlipidemia  E78.2 Lipid Panel With LDL/HDL Ratio    2. Primary hypertension  I10 EKG 12-Lead    Lipid Panel With LDL/HDL  Ratio    3. Chest pain, atypical  R07.89 CT CARDIAC SCORING ([facility] LOCATIONS ONLY)    Lipid Panel With LDL/HDL Ratio       Orders Placed This Encounter  Procedures   CT CARDIAC SCORING ([facility] LOCATIONS ONLY)    Standing Status:   Future    Standing Expiration Date:   10/03/2022    Order Specific Question:   Is patient pregnant?    Answer:   No    Order Specific Question:   Preferred imaging location?    Answer:   [facility]   Lipid Panel With LDL/HDL Ratio   EKG 12-Lead    No orders of the defined types were placed in this encounter.   Medications Discontinued During This Encounter  Medication Reason   busPIRone (BUSPAR) 10 MG tablet Dose change     Recommendations:   [place]  is a 57 y.o. with a significant PMH but not limited to, DM II, hyperlipidemia, HTN, obesity-binge eating disorder s/p gastric sleeve 2015, POCS, GERD, gallstones, and diverticulosis. She is currently still smoking, did quit for 13 years. She presents today to establish care and evaluation/management of statin therapy.   1. Mixed hyperlipidemia Plan  - Continue  Crestor 40 mg PO HS - Check Lipid Labs prior to next visit - Schedule CT Calcium Score   2. Primary hypertension Blood pressures are well controlled now that she restarted her Losartan. I encouraged her to start monitoring her blood pressures at home as she is actively trying to loose weight.  Plan - Continue taking Losartan 25 mg PO HS - Continue to exercise as tolerated  3. Chest pain, atypical I believe the chest pain described today is more musculoskeletal at origin. Though she has risk factors for CAD, we will continue to evaluate her risk stratification. Plan - Schedule CT Calcium Score   Patient seen with Erma Heritage, NP-s. I independently reviewed the chart and examined the patient. I agree with assessment & plan as documented above. Please see my comments below.   Will obtain CACS for risk stratification. Chest pain does not sound anginal. Given family history and risk factors, will proceed with CACS.     Floydene Flock, DO, [facility]  08/03/2022, 12:49 PM Office: [phone] Pager: [phone]

## 2022-09-10 LAB — LIPID PANEL WITH LDL/HDL RATIO
Cholesterol, Total: 224 mg/dL — ABNORMAL HIGH (ref 100–199)
HDL: 75 mg/dL (ref >39)
LDL Chol Calc (NIH): 107 mg/dL — ABNORMAL HIGH (ref 0–99)
LDL/HDL Ratio: 1.4 ratio (ref 0.0–3.2)
Triglycerides: 249 mg/dL — ABNORMAL HIGH (ref 0–149)
VLDL Cholesterol Cal: 42 mg/dL — ABNORMAL HIGH (ref 5–40)

## 2022-09-14 ENCOUNTER — Ambulatory Visit: Payer: Medicaid Other | Admitting: Internal Medicine

## 2022-09-14 ENCOUNTER — Encounter: Payer: Self-pay | Admitting: Internal Medicine

## 2022-09-14 VITALS — BP 130/80 | HR 72 | Resp 16 | Ht 69.0 in | Wt 278.0 lb

## 2022-09-14 DIAGNOSIS — E782 Mixed hyperlipidemia: Secondary | ICD-10-CM

## 2022-09-14 DIAGNOSIS — I10 Essential (primary) hypertension: Secondary | ICD-10-CM

## 2022-09-14 DIAGNOSIS — R0789 Other chest pain: Secondary | ICD-10-CM

## 2022-09-14 NOTE — Progress Notes (Signed)
Primary Physician/Referring:  Farris Has, MD  Patient ID: Alice Simpson, female    DOB: [birth year], 57 y.o.   MRN: [id number]  Chief Complaint  Patient presents with   Mixed hyperlipidemia   Follow-up   Results   HPI:    Alice Simpson  is a 57 y.o. with a significant PMH but not limited to, DM II, hyperlipidemia, HTN, obesity-binge eating disorder s/p gastric sleeve 2015, POCS, GERD, gallstones, and diverticulosis. She is currently still smoking, did quit for 13 years.   Patient presents for follow-up visit. Her blood pressure is very well controlled. She is starting to exercise more now that summer is approaching. She will try to eat less meat and watch her cholesterol more closely. She is happy her CACS was zero. Denies chest pain, shortness of breath, palpitations, diaphoresis, syncope, edema, PND, orthopnea.    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   Vertigo    Past Surgical History:  Procedure Laterality Date   BUNIONECTOMY     CESAREAN SECTION     LAPAROSCOPIC GASTRIC SLEEVE RESECTION N/A 09/24/2014   Procedure: LAPAROSCOPIC GASTRIC SLEEVE RESECTION upper endoscopy;  Surgeon: Luretha Murphy, MD;  Location: [facility];  Service: General;  Laterality: N/A;   RADIOACTIVE SEED GUIDED EXCISIONAL BREAST BIOPSY Left 03/31/2019   Procedure: RADIOACTIVE SEED GUIDED EXCISIONAL LEFT BREAST BIOPSY X2;  Surgeon: Luretha Murphy, MD;  Location: [facility];  Service: General;  Laterality: Left;   TOTAL HIP ARTHROPLASTY Right 11/20/2014   TOTAL HIP ARTHROPLASTY Right 11/20/2014   Procedure: TOTAL HIP ARTHROPLASTY ANTERIOR APPROACH;  Surgeon: Marcene Corning, MD;  Location: [facility];  Service:  Orthopedics;  Laterality: Right;   wisdom     WISDOM TOOTH EXTRACTION     Family History  Problem Relation Age of Onset   Heart disease Mother    High blood pressure Mother    High Cholesterol Mother    Diabetes Mother    Atrial fibrillation Mother    Diabetes Father    High blood pressure Father    High Cholesterol Father    Diabetes Other    Depression Other    Anxiety disorder Other    Heart disease Half-Sister    Heart attack Half-Sister    Breast cancer Neg Hx     Social History   Tobacco Use   Smoking status: Some Days    Packs/day: 1.00    Years: 25.00    Additional pack years: 0.00    Total pack years: 25.00    Types: Cigarettes    Last attempt to quit: 05/18/2004    Years since quitting: 18.3   Smokeless tobacco: Never   Tobacco comments:    Patient had quit smoking for 13 years and then started back.   Substance Use Topics   Alcohol use: Yes    Comment: rarely when does is liquor    Marital Status: Single  ROS   Review of Systems  Constitutional: Negative.   Respiratory: Negative.  Negative for shortness of breath.   Cardiovascular:  Negative for chest pain, palpitations, orthopnea, claudication, leg swelling and PND.  Musculoskeletal:  Negative for  myalgias.  Neurological: Negative.   Psychiatric/Behavioral: Negative.      Objective  Blood pressure 130/80, pulse 72, resp. rate 16, height 5\' 9"  (1.753 m), weight 278 lb (126.1 kg), SpO2 91 %. Body mass index is 41.05 kg/m.     09/14/2022   10:33 AM 08/03/2022    9:59 AM 07/16/2022    2:20 PM  Vitals with BMI  Height 5\' 9"  5\' 9"  5\' 9"   Weight 278 lbs 274 lbs 3 oz 273 lbs 10 oz  BMI 41.03 40.47 40.39  Systolic 130 129 213  Diastolic 80 84 84  Pulse 72 71 97    Physical Exam Cardiovascular:     Rate and Rhythm: Normal rate and regular rhythm.     Pulses: Normal pulses.     Heart sounds: No murmur heard.    No friction rub. No gallop.  Pulmonary:     Effort: Pulmonary effort is normal.      Breath sounds: Normal breath sounds.  Abdominal:     Palpations: Abdomen is soft.  Musculoskeletal:     Right lower leg: No edema.     Left lower leg: No edema.  Skin:    General: Skin is warm and dry.  Neurological:     Mental Status: She is alert. Mental status is at baseline.  Psychiatric:        Mood and Affect: Mood normal.     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   Lactose Intolerance (Gi)    Penicillin G Hives    hives   Prochlorperazine Itching and Anxiety     Medication list after today's encounter   Current Outpatient Medications:    Accu-Chek Softclix Lancets lancets, Use as instructed to check blood sugar 1X daily, Disp: 100 each, Rfl: 3   Blood Glucose Monitoring Suppl (ACCU-CHEK GUIDE) w/Device KIT, Use as instructed to check blood sugar 1X daily, Disp: 1 kit, Rfl: 0   busPIRone (BUSPAR) 15 MG tablet, Take 15 mg by mouth 2 (two) times daily., Disp: , Rfl:    DULoxetine HCl 40 MG CPEP, Take 40 mg by mouth daily., Disp: , Rfl:    glucose blood (ACCU-CHEK GUIDE) test strip, Use as instructed to check blood sugar 1X daily, Disp: 100 each, Rfl: 3   LORazepam (ATIVAN) 0.5 MG tablet, Take 0.5 mg by mouth every 8 (eight) hours., Disp: , Rfl:    losartan (COZAAR) 25 MG tablet, Take 25 mg by mouth daily., Disp: , Rfl:    metFORMIN (GLUCOPHAGE-XR) 500 MG 24 hr tablet, Take 2 tablets (1,000 mg total) by mouth 2 (two) times daily., Disp: 360 tablet, Rfl: 3   Multiple Vitamin (MULTI-VITAMINS) TABS, Take 1 tablet by mouth 2 (two) times daily. Prenatal, Disp: , Rfl:    OZEMPIC, 1 MG/DOSE, 4 MG/3ML SOPN, INJECT 1mg  into THE SKIN ONCE WEEKLY, Disp: 9 mL, Rfl: 3   prenatal vitamin w/FE, FA (PRENATAL 1 + 1) 27-1 MG TABS tablet, Take 1 tablet by mouth daily at 12 noon., Disp: , Rfl:    rosuvastatin (CRESTOR) 20 MG tablet, Take 20 mg by mouth daily., Disp: , Rfl:    Laboratory examination:   Lab Results  Component Value Date   NA 140 07/06/2018   K 4.6 07/06/2018   CO2 23 07/06/2018   GLUCOSE 145 (H) 07/06/2018   BUN 8 07/06/2018   CREATININE  0.69 07/06/2018   CALCIUM 9.8 07/06/2018   GFRNONAA 100 07/06/2018       Latest Ref Rng & Units 07/06/2018    9:12 AM 12/16/2017    2:25 PM 11/21/2014    6:04 AM  CMP  Glucose 65 - 99 mg/dL 811  914  782   BUN 6 - 24 mg/dL 8  8  <5   Creatinine 9.56 - 1.00 mg/dL 2.13  0.86  5.78   Sodium 134 - 144 mmol/L 140  138  135   Potassium 3.5 - 5.2 mmol/L 4.6  4.0  4.2   Chloride 96 - 106 mmol/L 102  100  100   CO2 20 - 29 mmol/L 23  26  28    Calcium 8.7 - 10.2 mg/dL 9.8  9.3  8.7   Total Protein 6.0 - 8.5 g/dL 7.1  8.9    Total Bilirubin 0.0 - 1.2 mg/dL 0.4  1.1    Alkaline Phos 39 - 117 IU/L 94  65    AST 0 - 40 IU/L 27  15    ALT 0 - 32 IU/L 25  15        Latest Ref Rng & Units 12/16/2017    2:25 PM 11/23/2014    4:50 AM 11/22/2014    4:10 AM  CBC  WBC 4.0 - 10.5 K/uL 16.2  12.0  13.6   Hemoglobin 12.0 - 15.0 g/dL 46.9  9.2  9.6   Hematocrit 36.0 - 46.0 % 42.2  28.1  29.1   Platelets 150 - 400 K/uL 367  275  273     Lipid Panel 07/16/2022: Chol 212, TG 132, HDL 80, LDL 110  HEMOGLOBIN A1C Lab Results  Component Value Date   HGBA1C 6.0 (A) 07/16/2022   MPG 151 09/24/2014   TSH No results for input(s): "TSH" in the last 8760 hours.  External labs:  07/16/2022 Glucose 97, BUN/Cr 16/0.72. EGFR 98. Na/K 140/4.4. Rest of the CMP normal H/H 13.7/41.3. MCV 83.4. Platelets 320  Radiology:    Cardiac Studies:   08/10/2022 CACS FINDINGS:  - Total Calcium Score: 0.  -- Left Main: 0.  -- Left Anterior Descending: 0.  -- Circumflex: 0.  -- Right Coronary: 0  -- Posterior Descending Artery: 0    EKG:   Sinus Rhythm, rate 70 bpm. Normal R wave progression, normal axis. No ischemia.  Assessment     ICD-10-CM   1. Mixed hyperlipidemia  E78.2     2. Primary hypertension  I10     3.  Chest pain, atypical  R07.89        No orders of the defined types were placed in this encounter.   No orders of the defined types were placed in this encounter.   There are no discontinued medications.    Recommendations:   Alice Simpson  is a 57 y.o. with a significant PMH but not limited to, DM II, hyperlipidemia, HTN, obesity-binge eating disorder s/p gastric sleeve 2015, POCS, GERD, gallstones, and diverticulosis. She is currently still smoking, did quit for 13 years. She presents today to establish care and evaluation/management of statin therapy.   1. Mixed hyperlipidemia Plan  - Continue Crestor 20 mg PO HS - CACS was 0 - she will work on changing her diet and start exercising   2. Primary hypertension Blood pressures are well controlled now that she restarted her Losartan. I encouraged her to start monitoring her blood pressures at home as she is  actively trying to loose weight.  Plan - Continue taking Losartan 25 mg PO HS - Continue to exercise as tolerated  3. Chest pain, atypical I believe the chest pain described last visit was more musculoskeletal at origin.  - CACS is 0 - discussed results with patient, all questions answered    Clotilde Dieter, DO  09/14/2022, 10:49 AM Office: [phone] Pager: [phone]

## 2022-12-11 ENCOUNTER — Inpatient Hospital Stay (HOSPITAL_BASED_OUTPATIENT_CLINIC_OR_DEPARTMENT_OTHER)
Admission: EM | Admit: 2022-12-11 | Discharge: 2022-12-13 | DRG: 153 | Disposition: A | Discharge status: Home / Self Care | Payer: Medicaid Other | Attending: Internal Medicine | Admitting: Internal Medicine

## 2022-12-11 ENCOUNTER — Encounter (HOSPITAL_BASED_OUTPATIENT_CLINIC_OR_DEPARTMENT_OTHER): Payer: Self-pay

## 2022-12-11 ENCOUNTER — Other Ambulatory Visit: Payer: Self-pay

## 2022-12-11 ENCOUNTER — Emergency Department (HOSPITAL_BASED_OUTPATIENT_CLINIC_OR_DEPARTMENT_OTHER): Payer: Medicaid Other

## 2022-12-11 DIAGNOSIS — Z83438 Family history of other disorder of lipoprotein metabolism and other lipidemia: Secondary | ICD-10-CM

## 2022-12-11 DIAGNOSIS — E1142 Type 2 diabetes mellitus with diabetic polyneuropathy: Secondary | ICD-10-CM | POA: Diagnosis present

## 2022-12-11 DIAGNOSIS — Z87891 Personal history of nicotine dependence: Secondary | ICD-10-CM | POA: Diagnosis not present

## 2022-12-11 DIAGNOSIS — F32A Depression, unspecified: Secondary | ICD-10-CM | POA: Diagnosis present

## 2022-12-11 DIAGNOSIS — Z818 Family history of other mental and behavioral disorders: Secondary | ICD-10-CM

## 2022-12-11 DIAGNOSIS — J029 Acute pharyngitis, unspecified: Secondary | ICD-10-CM | POA: Diagnosis present

## 2022-12-11 DIAGNOSIS — F988 Other specified behavioral and emotional disorders with onset usually occurring in childhood and adolescence: Secondary | ICD-10-CM | POA: Diagnosis present

## 2022-12-11 DIAGNOSIS — E119 Type 2 diabetes mellitus without complications: Secondary | ICD-10-CM

## 2022-12-11 DIAGNOSIS — G4700 Insomnia, unspecified: Secondary | ICD-10-CM | POA: Diagnosis present

## 2022-12-11 DIAGNOSIS — Z7984 Long term (current) use of oral hypoglycemic drugs: Secondary | ICD-10-CM

## 2022-12-11 DIAGNOSIS — E785 Hyperlipidemia, unspecified: Secondary | ICD-10-CM | POA: Diagnosis present

## 2022-12-11 DIAGNOSIS — E782 Mixed hyperlipidemia: Secondary | ICD-10-CM | POA: Diagnosis not present

## 2022-12-11 DIAGNOSIS — F411 Generalized anxiety disorder: Secondary | ICD-10-CM | POA: Diagnosis present

## 2022-12-11 DIAGNOSIS — J36 Peritonsillar abscess: Secondary | ICD-10-CM | POA: Diagnosis present

## 2022-12-11 DIAGNOSIS — Z8249 Family history of ischemic heart disease and other diseases of the circulatory system: Secondary | ICD-10-CM | POA: Diagnosis not present

## 2022-12-11 DIAGNOSIS — Z96641 Presence of right artificial hip joint: Secondary | ICD-10-CM | POA: Diagnosis present

## 2022-12-11 DIAGNOSIS — Z6841 Body Mass Index (BMI) 40.0 and over, adult: Secondary | ICD-10-CM

## 2022-12-11 DIAGNOSIS — Z79899 Other long term (current) drug therapy: Secondary | ICD-10-CM | POA: Diagnosis not present

## 2022-12-11 DIAGNOSIS — Z794 Long term (current) use of insulin: Secondary | ICD-10-CM | POA: Diagnosis not present

## 2022-12-11 DIAGNOSIS — Z880 Allergy status to penicillin: Secondary | ICD-10-CM

## 2022-12-11 DIAGNOSIS — I10 Essential (primary) hypertension: Secondary | ICD-10-CM | POA: Diagnosis present

## 2022-12-11 DIAGNOSIS — B950 Streptococcus, group A, as the cause of diseases classified elsewhere: Secondary | ICD-10-CM | POA: Diagnosis present

## 2022-12-11 DIAGNOSIS — T380X5A Adverse effect of glucocorticoids and synthetic analogues, initial encounter: Secondary | ICD-10-CM | POA: Diagnosis not present

## 2022-12-11 DIAGNOSIS — Z888 Allergy status to other drugs, medicaments and biological substances status: Secondary | ICD-10-CM | POA: Diagnosis not present

## 2022-12-11 DIAGNOSIS — E739 Lactose intolerance, unspecified: Secondary | ICD-10-CM | POA: Diagnosis present

## 2022-12-11 DIAGNOSIS — Z7985 Long-term (current) use of injectable non-insulin antidiabetic drugs: Secondary | ICD-10-CM

## 2022-12-11 DIAGNOSIS — E1165 Type 2 diabetes mellitus with hyperglycemia: Secondary | ICD-10-CM | POA: Diagnosis not present

## 2022-12-11 DIAGNOSIS — Z833 Family history of diabetes mellitus: Secondary | ICD-10-CM | POA: Diagnosis not present

## 2022-12-11 LAB — BASIC METABOLIC PANEL
Anion gap: 11 (ref 5–15)
BUN: 12 mg/dL (ref 6–20)
CO2: 25 mmol/L (ref 22–32)
Calcium: 9.2 mg/dL (ref 8.9–10.3)
Chloride: 104 mmol/L (ref 98–111)
Creatinine, Ser: 0.61 mg/dL (ref 0.44–1.00)
GFR, Estimated: >60 mL/min (ref >60)
Glucose, Bld: 237 mg/dL — ABNORMAL HIGH (ref 70–99)
Potassium: 3.7 mmol/L (ref 3.5–5.1)
Sodium: 140 mmol/L (ref 135–145)

## 2022-12-11 LAB — CBC WITH DIFFERENTIAL/PLATELET
Abs Immature Granulocytes: 0.03 10*3/uL (ref 0.00–0.07)
Basophils Absolute: 0.1 10*3/uL (ref 0.0–0.1)
Basophils Relative: 1 %
Eosinophils Absolute: 0.2 10*3/uL (ref 0.0–0.5)
Eosinophils Relative: 3 %
HCT: 39.3 % (ref 36.0–46.0)
Hemoglobin: 12.9 g/dL (ref 12.0–15.0)
Immature Granulocytes: 0 %
Lymphocytes Relative: 30 %
Lymphs Abs: 2.7 10*3/uL (ref 0.7–4.0)
MCH: 27.7 pg (ref 26.0–34.0)
MCHC: 32.8 g/dL (ref 30.0–36.0)
MCV: 84.3 fL (ref 80.0–100.0)
Monocytes Absolute: 0.8 10*3/uL (ref 0.1–1.0)
Monocytes Relative: 9 %
Neutro Abs: 5.1 10*3/uL (ref 1.7–7.7)
Neutrophils Relative %: 57 %
Platelets: 261 10*3/uL (ref 150–400)
RBC: 4.66 MIL/uL (ref 3.87–5.11)
RDW: 13 % (ref 11.5–15.5)
WBC: 9 10*3/uL (ref 4.0–10.5)
nRBC: 0 % (ref 0.0–0.2)

## 2022-12-11 LAB — GLUCOSE, CAPILLARY: Glucose-Capillary: 301 mg/dL — ABNORMAL HIGH (ref 70–99)

## 2022-12-11 MED ORDER — SODIUM CHLORIDE 0.9 % IV SOLN
1.0000 g | Freq: Once | INTRAVENOUS | Status: AC
  Administered 2022-12-11: 1 g via INTRAVENOUS

## 2022-12-11 MED ORDER — DEXAMETHASONE SODIUM PHOSPHATE 10 MG/ML IJ SOLN
10.0000 mg | Freq: Once | INTRAMUSCULAR | Status: AC
  Administered 2022-12-11: 10 mg via INTRAVENOUS
  Filled 2022-12-11: qty 1

## 2022-12-11 MED ORDER — LORAZEPAM 0.5 MG PO TABS: 0.5000 mg | ORAL_TABLET | Freq: Three times a day (TID) | ORAL | Status: DC | PRN

## 2022-12-11 MED ORDER — PHENOL 1.4 % MT LIQD
1.0000 | OROMUCOSAL | Status: DC | PRN
  Administered 2022-12-11: 1 via OROMUCOSAL
  Filled 2022-12-11: qty 177

## 2022-12-11 MED ORDER — ONDANSETRON HCL 4 MG/2ML IJ SOLN: 4.0000 mg | Freq: Four times a day (QID) | INTRAMUSCULAR | Status: DC | PRN

## 2022-12-11 MED ORDER — NALOXONE HCL 0.4 MG/ML IJ SOLN: 0.4000 mg | INTRAMUSCULAR | Status: DC | PRN

## 2022-12-11 MED ORDER — DULOXETINE HCL 60 MG PO CPEP
60.0000 mg | ORAL_CAPSULE | Freq: Every day | ORAL | Status: DC
  Administered 2022-12-12 – 2022-12-13 (×2): 60 mg via ORAL
  Filled 2022-12-11 (×2): qty 1

## 2022-12-11 MED ORDER — LOSARTAN POTASSIUM 25 MG PO TABS
25.0000 mg | ORAL_TABLET | Freq: Every day | ORAL | Status: DC
  Administered 2022-12-12 – 2022-12-13 (×2): 25 mg via ORAL
  Filled 2022-12-11 (×2): qty 1

## 2022-12-11 MED ORDER — IOHEXOL 300 MG/ML  SOLN
100.0000 mL | Freq: Once | INTRAMUSCULAR | Status: AC | PRN
  Administered 2022-12-11: 75 mL via INTRAVENOUS

## 2022-12-11 MED ORDER — BUSPIRONE HCL 5 MG PO TABS
10.0000 mg | ORAL_TABLET | Freq: Two times a day (BID) | ORAL | Status: DC
  Administered 2022-12-11 – 2022-12-13 (×4): 10 mg via ORAL
  Filled 2022-12-11 (×4): qty 2

## 2022-12-11 MED ORDER — ACETAMINOPHEN 650 MG RE SUPP: 650.0000 mg | Freq: Four times a day (QID) | RECTAL | Status: DC | PRN

## 2022-12-11 MED ORDER — INSULIN ASPART 100 UNIT/ML IJ SOLN
3.0000 [IU] | Freq: Once | INTRAMUSCULAR | Status: AC
  Administered 2022-12-11: 3 [IU] via SUBCUTANEOUS

## 2022-12-11 MED ORDER — SODIUM CHLORIDE 0.9 % IV SOLN
1.0000 g | Freq: Three times a day (TID) | INTRAVENOUS | Status: DC
  Administered 2022-12-11 – 2022-12-12 (×3): 1 g via INTRAVENOUS
  Filled 2022-12-11 (×6): qty 20

## 2022-12-11 MED ORDER — MENTHOL 3 MG MT LOZG
1.0000 | LOZENGE | OROMUCOSAL | Status: DC | PRN
  Administered 2022-12-11: 3 mg via ORAL
  Filled 2022-12-11: qty 9

## 2022-12-11 MED ORDER — KETOROLAC TROMETHAMINE 15 MG/ML IJ SOLN
15.0000 mg | Freq: Four times a day (QID) | INTRAMUSCULAR | Status: DC | PRN
  Administered 2022-12-12: 15 mg via INTRAVENOUS
  Filled 2022-12-11: qty 1

## 2022-12-11 MED ORDER — MELATONIN 3 MG PO TABS
3.0000 mg | ORAL_TABLET | Freq: Every evening | ORAL | Status: DC | PRN
  Administered 2022-12-11: 3 mg via ORAL
  Filled 2022-12-11: qty 1

## 2022-12-11 MED ORDER — ACETAMINOPHEN 325 MG PO TABS
650.0000 mg | ORAL_TABLET | Freq: Four times a day (QID) | ORAL | Status: DC | PRN
  Administered 2022-12-12 – 2022-12-13 (×2): 650 mg via ORAL
  Filled 2022-12-11 (×2): qty 2

## 2022-12-11 MED ORDER — ROSUVASTATIN CALCIUM 20 MG PO TABS
40.0000 mg | ORAL_TABLET | Freq: Every day | ORAL | Status: DC
  Administered 2022-12-12 – 2022-12-13 (×2): 40 mg via ORAL
  Filled 2022-12-11 (×2): qty 2

## 2022-12-11 MED ORDER — INSULIN ASPART 100 UNIT/ML IJ SOLN
0.0000 [IU] | Freq: Three times a day (TID) | INTRAMUSCULAR | Status: DC
  Administered 2022-12-12 (×2): 5 [IU] via SUBCUTANEOUS
  Administered 2022-12-12: 1 [IU] via SUBCUTANEOUS
  Administered 2022-12-13: 3 [IU] via SUBCUTANEOUS

## 2022-12-11 MED ORDER — DEXAMETHASONE SODIUM PHOSPHATE 10 MG/ML IJ SOLN
10.0000 mg | INTRAMUSCULAR | Status: DC
  Administered 2022-12-12: 10 mg via INTRAVENOUS
  Filled 2022-12-11: qty 1

## 2022-12-11 MED ORDER — LACTATED RINGERS IV SOLN: INTRAVENOUS | Status: DC

## 2022-12-11 MED ORDER — FENTANYL CITRATE PF 50 MCG/ML IJ SOSY: 50.0000 ug | PREFILLED_SYRINGE | INTRAMUSCULAR | Status: DC | PRN

## 2022-12-11 NOTE — Progress Notes (Signed)
Pharmacy Antibiotic Note  Alice Simpson is a 57 y.o. female admitted on 12/11/2022 with peritonsilar abscess.  WBC wnl Cr0.6 afebrile. Pharmacy has been consulted for meropenem dosing.  Plan: Meropenem 1gm IV q8h     Temp (24hrs), Avg:98.2 F (36.8 C), Min:98 F (36.7 C), Max:98.3 F (36.8 C)  Recent Labs  Lab 12/11/22 1229  WBC 9.0  CREATININE 0.61    CrCl cannot be calculated (Unknown ideal weight.).    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 CPP, BCPS Clinical Pharmacist [phone] 12/11/2022 8:39 PM

## 2022-12-11 NOTE — H&P (Signed)
History and Physical      Alice Simpson GLO:[id number] DOB: June 27, 1965 DOA: 12/11/2022; DOS: 12/11/2022  PCP: Farris Has, MD  Patient coming from: home   I have personally briefly reviewed patient's old medical records in [facility]  Chief Complaint: Sore throat  HPI: Alice Simpson is a 57 y.o. female with medical history significant for type 2 diabetes mellitus, essential hypertension, GAD, who is admitted to [facility] on 12/11/2022 by way of transfer from [facility] with right-sided peritonsillar abscess after presenting from home to the latter facility complaining of sore throat.  In the setting of sore throat, she had presented to [facility] on 12/09/2022, at which time she was diagnosed with strep pharyngitis in the setting of positive rapid strep a test.  In the context of a history of allergy to penicillin, she was started on clindamycin.  However, in spite of good interval compliance on the latter antibiotic, she notes further progression in her odynophagia, prompting her to contact her PCP, recommended that she go to the emergency room for further evaluation and management thereof.  Denies any associated subjective fever, chills or rigors, or generalized myalgias.  No rash.  No associated any dysuria or gross hematuria.  No associated any stridor, wheezing, shortness of breath.  She notes a slight interval decrease in oral intake as consequence of associated odynophagia.  No significant headache nor any associated neck stiffness.  Medical history notable for type 2 diabetes mellitus, with most recent hemoglobin A1c noted to be 6.0% when checked in February 2024.    [facility] ED Course:  Vital signs in the ED were notable for the following: Afebrile; heart rate in the 60s to 80s; stop blood pressures in the 1 teens 140s; respiratory rate 15-18, oxygen saturation 97 to 100% on room air.  Labs were notable for the following: BMP notable for  the following: Sodium 140, bicarbonate 25, anion gap 11, creatinine 0.61, glucose 237.  CBC notable for will with cell count 9000.  Imaging in the ED, per corresponding formal radiology read, was notable for the following: CT soft tissue neck with contrast showed mild prominence of the palatine tonsils compatible with acute tonsillitis/pharyngitis, also showing 6 mm focus of hypodensity in the region of the inferior right palatine tonsil, suggestive of small peritonsillar abscess, without significant airway effacement.  EDP at [facility] discussed patient's case and imaging with on-call ENT, Dr. Jearld Fenton, who conveyed that there does not appear to be an indication for surgical intervention for the patient's peritonsillar abscess or other recommends conservative management, including antibiotics.  Dr. Jearld Fenton conveyed that he is available as needed for formal consult if no improvement with interval antibiotics.  While in the ED, the following were administered: Decadron 10 mg IV x 1, meropenem.  Subsequently, the patient was admitted to [facility] for further evaluation management of right-sided peritonsillar abscess in the setting of acute tonsillitis/pharyngitis as consequence of acute strep A infection.     Review of Systems: As per HPI otherwise 10 point review of systems negative.   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   Vertigo     Past  Surgical History:  Procedure Laterality Date   BUNIONECTOMY     CESAREAN SECTION     LAPAROSCOPIC GASTRIC SLEEVE RESECTION N/A 09/24/2014   Procedure: LAPAROSCOPIC GASTRIC SLEEVE RESECTION upper endoscopy;  Surgeon: Luretha Murphy, MD;  Location: [facility];  Service: General;  Laterality:  N/A;   RADIOACTIVE SEED GUIDED EXCISIONAL BREAST BIOPSY Left 03/31/2019   Procedure: RADIOACTIVE SEED GUIDED EXCISIONAL LEFT BREAST BIOPSY X2;  Surgeon: Luretha Murphy, MD;  Location: [facility];  Service: General;  Laterality: Left;   TOTAL HIP ARTHROPLASTY Right 11/20/2014   TOTAL HIP ARTHROPLASTY Right 11/20/2014   Procedure: TOTAL HIP ARTHROPLASTY ANTERIOR APPROACH;  Surgeon: Marcene Corning, MD;  Location: [facility] OR;  Service: Orthopedics;  Laterality: Right;   wisdom     WISDOM TOOTH EXTRACTION      Social History:  reports that she quit smoking about 18 years ago. Her smoking use included cigarettes. She started smoking about 43 years ago. She has a 25 pack-year smoking history. She has never used smokeless tobacco. She reports current alcohol use. She reports current drug use. Drug: Marijuana.   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   Lactose Intolerance (Gi)    Penicillin G Hives    hives   Prochlorperazine Itching and Anxiety    Family History  Problem Relation Age of Onset   Heart disease Mother    High blood pressure Mother    High Cholesterol Mother    Diabetes Mother    Atrial fibrillation Mother    Diabetes Father    High blood pressure Father    High Cholesterol Father    Diabetes Other    Depression Other    Anxiety disorder Other    Heart disease Half-Sister    Heart attack Half-Sister    Breast cancer Neg Hx     Family history reviewed and not pertinent    Prior to Admission medications   Medication Sig Start Date End Date Taking? Authorizing Provider  busPIRone (BUSPAR) 10 MG tablet Take 10 mg by mouth 2 (two) times daily.   Yes [provider]  DULoxetine (CYMBALTA) 60 MG capsule Take 60 mg by mouth daily.   Yes [provider]  LORazepam (ATIVAN) 0.5 MG tablet Take 0.5 mg by mouth every 8 (eight) hours as needed for anxiety.   Yes  [provider]  losartan (COZAAR) 25 MG tablet Take 25 mg by mouth daily.   Yes [provider]  metFORMIN (GLUCOPHAGE-XR) 500 MG 24 hr tablet Take 2 tablets (1,000 mg total) by mouth 2 (two) times daily. 02/02/22  Yes Carlus Pavlov, MD  Multiple Vitamin (MULTI-VITAMINS) TABS Take 1 tablet by mouth 2 (two) times daily. Prenatal   Yes [provider]  OZEMPIC, 1 MG/DOSE, 4 MG/3ML SOPN INJECT 1mg  into THE SKIN ONCE WEEKLY Patient taking differently: Inject 1 mg into the skin once a week. 12/15/21  Yes Carlus Pavlov, MD  prenatal vitamin w/FE, FA (PRENATAL 1 + 1) 27-1 MG TABS tablet Take 1 tablet by mouth daily at 12 noon.   Yes [provider]  rosuvastatin (CRESTOR) 40 MG tablet Take 40 mg by mouth daily.   Yes [provider]  Accu-Chek Softclix Lancets lancets Use as instructed to check blood sugar 1X daily 07/16/22   Carlus Pavlov, MD  Blood Glucose Monitoring Suppl (ACCU-CHEK GUIDE) w/Device KIT Use as instructed to check blood sugar  1X daily 07/16/22   Carlus Pavlov, MD  glucose blood (ACCU-CHEK GUIDE) test strip Use as instructed to check blood sugar 1X daily 07/16/22   Carlus Pavlov, MD     Objective    Physical Exam: Vitals:   12/11/22 1500 12/11/22 1530 12/11/22 1612 12/11/22 2025  BP: 117/83 127/75  (!) 140/79  Pulse: 71 68  72  Resp:    16  Temp:   98.3 F (36.8 C) 98 F (36.7 C)  TempSrc:   Oral Oral  SpO2: 98% 97%  99%    General: appears to be stated age; alert, oriented Skin: warm, dry, no rash Head:  AT/[place] Mouth:  Oral mucosa membranes appear dry, normal dentition Neck: supple; mild cervical lymphadenopathy; trachea midline Heart:  RRR; did not appreciate any M/R/G Lungs: CTAB, did not appreciate any wheezes, rales, or rhonchi Abdomen: + BS; soft, ND, NT Vascular: 2+ pedal pulses b/l; 2+ radial pulses b/l Extremities: no peripheral edema, no muscle wasting Neuro: strength and sensation intact in upper  and lower extremities b/l     Labs on Admission: I have personally reviewed following labs and imaging studies  CBC: Recent Labs  Lab 12/11/22 1229  WBC 9.0  NEUTROABS 5.1  HGB 12.9  HCT 39.3  MCV 84.3  PLT 261   Basic Metabolic Panel: Recent Labs  Lab 12/11/22 1229  NA 140  K 3.7  CL 104  CO2 25  GLUCOSE 237*  BUN 12  CREATININE 0.61  CALCIUM 9.2   GFR: CrCl cannot be calculated (Unknown ideal weight.). Liver Function Tests: No results for input(s): "AST", "ALT", "ALKPHOS", "BILITOT", "PROT", "ALBUMIN" in the last 168 hours. No results for input(s): "LIPASE", "AMYLASE" in the last 168 hours. No results for input(s): "AMMONIA" in the last 168 hours. Coagulation Profile: No results for input(s): "INR", "PROTIME" in the last 168 hours. Cardiac Enzymes: No results for input(s): "CKTOTAL", "CKMB", "CKMBINDEX", "TROPONINI" in the last 168 hours. BNP (last 3 results) No results for input(s): "PROBNP" in the last 8760 hours. HbA1C: No results for input(s): "HGBA1C" in the last 72 hours. CBG: No results for input(s): "GLUCAP" in the last 168 hours. Lipid Profile: No results for input(s): "CHOL", "HDL", "LDLCALC", "TRIG", "CHOLHDL", "LDLDIRECT" in the last 72 hours. Thyroid Function Tests: No results for input(s): "TSH", "T4TOTAL", "FREET4", "T3FREE", "THYROIDAB" in the last 72 hours. Anemia Panel: No results for input(s): "VITAMINB12", "FOLATE", "FERRITIN", "TIBC", "IRON", "RETICCTPCT" in the last 72 hours. Urine analysis:    Component Value Date/Time   COLORURINE AMBER (A) 11/08/2014 1044   APPEARANCEUR CLOUDY (A) 11/08/2014 1044   LABSPEC 1.037 (H) 11/08/2014 1044   PHURINE 5.0 11/08/2014 1044   GLUCOSEU NEGATIVE 11/08/2014 1044   HGBUR NEGATIVE 11/08/2014 1044   BILIRUBINUR MODERATE (A) 11/08/2014 1044   KETONESUR 15 (A) 11/08/2014 1044   PROTEINUR NEGATIVE 11/08/2014 1044   UROBILINOGEN 1.0 11/08/2014 1044   NITRITE NEGATIVE 11/08/2014 1044    LEUKOCYTESUR SMALL (A) 11/08/2014 1044    Radiological Exams on Admission: CT Soft Tissue Neck W Contrast  Result Date: 12/11/2022 CLINICAL DATA:  Provided history: Epiglottitis or tonsillitis suspected. Sore throat. Neck pain. The patient reports being diagnosed with strep on Tuesday, no relief with antibiotics. EXAM: CT NECK WITH CONTRAST TECHNIQUE: Multidetector CT imaging of the neck was performed using the standard protocol following the bolus administration of intravenous contrast. RADIATION DOSE REDUCTION: This exam was performed according to the departmental dose-optimization program which includes automated exposure control, adjustment of the mA and/or  kV according to patient size and/or use of iterative reconstruction technique. CONTRAST:  75mL OMNIPAQUE IOHEXOL 300 MG/ML  SOLN COMPARISON:  CT angiogram neck 08/02/2018. FINDINGS: Pharynx and larynx: Streak/beam hardening artifact arising from dental restoration partially obscures the oral cavity. Mild prominence of the palatine tonsils (right greater than left). 6 mm focus of hypodensity in the region of the inferior right palatine tonsil (best appreciated on series 5, images 54 and 55). No appreciable swelling elsewhere within the oral cavity, pharynx or larynx. No retropharyngeal collection. No significant airway effacement. Salivary glands: No inflammation, mass, or stone. Thyroid: Unremarkable. Lymph nodes: Bilateral level II lymphadenopathy. An index right level 2 lymph node measures 2.2 cm in short axis (series 4, image 69). Vascular: The major vascular structures of the neck are patent. Mild atherosclerotic plaque about the carotid bifurcations, bilaterally. Limited intracranial: No evidence of an acute intracranial abnormality within the field of view. Visualized orbits: No orbital mass or acute orbital finding. Mastoids and visualized paranasal sinuses: No significant paranasal sinus disease or mastoid effusion. Skeleton: Cervical  spondylosis. Disc space narrowing is advanced at C5-C6. No acute fracture or aggressive osseous lesion. Upper chest: No consolidation within the imaged lung apices. IMPRESSION: 1. Mild prominence of the palatine tonsils, nonspecific but compatible with acute tonsillitis/pharyngitis in the appropriate clinical setting. There is a 6 mm focus of hypodensity in the region of the inferior right palatine tonsil, which may reflect a small tonsillar/peritonsillar abscess. No significant airway effacement. 2. Bilateral cervical lymphadenopathy as described. This may be reactive in the setting of an acute infection. However, recommend close clinical follow-up (with imaging follow-up as warranted) to ensure resolution and to exclude alternative etiologies. 3. Cervical spondylosis. Disc degeneration is advanced at C5-C6. Electronically Signed   By: Jackey Loge D.O.   On: 12/11/2022 14:30      Assessment/Plan   Principal Problem:   Peritonsillar abscess Active Problems:   Depression   Hyperlipidemia   Streptococcal infection group A   Acute pharyngitis   DM2 (diabetes mellitus, type 2) (HCC)   Essential hypertension   GAD (generalized anxiety disorder)      #) Right peritonsillar abscess: In the setting of recent diagnosis of strep a  pharyngitis, with progression of sore throat in spite of 2 days of clindamycin, CT soft tissue neck performed today is suggestive of a 6 mm right-sided peritonsillar abscess, without evidence of airway effacement radiographically, and without clinical evidence to suggest airway compromise at this time.  EDP at [facility] discussed patient's case and imaging with on-call ENT, Dr. Jearld Fenton, who conveyed that there does not appear to be an indication for surgical intervention for the patient's peritonsillar abscess or other recommends conservative management, including antibiotics.  Dr. Jearld Fenton conveyed that he is available as needed for formal consult if no improvement with  interval antibiotics.  In the context of her history of allergies to penicillin and given no significant outpatient ferment with good interval compliance while on clindamycin for 2 days, the patient was started on meropenem in the emergency room this evening.  Will continue this antibiotic for now.  She also received a dose of IV Decadron at [facility].  In the absence of objective fever or leukocytosis, SIRS criteria for sepsis not currently met and the patient appears hemodynamically stable.   Plan: Continue meropenem, as above.  Decadron 10 mg IV daily.  Prn IV Toradol.  As needed acetaminophen for fever.  Lactated Ringer's at will 100 cc/h x 12 hours.  CMP, CBC  in the morning.  Monitor strict I's and O's and daily weights.  Prn Cepacol throat lozenges.  As needed Chloraseptic throat spray.  Prn formally MD consultation if no improvement with IV antibiotics, per EDP's discussion with ENT, as above.              #) Acute tonsillitis/pharyngitis as a consequence of acute strep a infection: In the setting of recent diagnosis of group A strep via rapid strep a test performed as an outpatient on 12/09/2022, the patient presents with progression of sore throat with CT soft tissue neck showing evidence of inflammation of the palatine tonsils compatible with acute tonsillitis/pharyngitis.  No associated any rash or change in renal function.  Was started on meropenem at [facility] earlier this evening after no improvement with 2 days of outpatient clindamycin, all in the context of a documented history of allergy to penicillin no evidence of airway compromise, as further detailed above.  Plan: Continue meropenem, as above.  Further evaluation management of right-sided quinsy, as above.  Prn typical with her last dose, prn Chloraseptic Spray.  As needed IV Toradol.  CMP, CBC in the morning.  Gentle IV fluids overnight, as above.                   #) Type 2 Diabetes Mellitus:  documented history of such. Home insulin regimen: None. Home oral hypoglycemic agents: Metformin.  Additionally, she is on Ozempic as an outpatient.  Presenting blood sugar: 237.  Appears to be well-controlled as outpatient, with most recent A1c noted to be 6.0% when checked in February 2024.  Plan: accuchecks QAC and HS with low dose SSI. hold home oral hypoglycemic agents during this hospitalization.  Hold Ozempic while in the hospital.                 #) Essential Hypertension: documented h/o such, with outpatient antihypertensive regimen including losartan.  SBP's in the ED today: 1 teens to 140s mmHg.   Plan: Close monitoring of subsequent BP via routine VS. resume home losartan.  CMP in the morning.                   #) Hyperlipidemia: documented h/o such. On high intensity rosuvastatin as outpatient.   Plan: continue home statin.                   #) Generalized anxiety disorder: documented h/o such. On scheduled BuSpar as well as as needed Ativan as outpatient.    Plan: Continue home scheduled BuSpar and as needed Ativan.                   #) Depression: documented h/o such. On Cymbalta as outpatient.    Plan: Continue home Cymbalta.      DVT prophylaxis: SCD's   Code Status: Full code Family Communication: none Disposition Plan: Per Rounding Team Consults called: EDP at [facility] d/w on-call ENT, Dr. Jearld Fenton, as further detailed above;  Admission status: Accepted for inpatient to MedSurg     I SPENT GREATER THAN 75  MINUTES IN CLINICAL CARE TIME/MEDICAL DECISION-MAKING IN COMPLETING THIS ADMISSION.      Chaney Born Edmundo Tedesco DO [facility]  From 7PM - 7AM   12/11/2022, 9:20 PM

## 2022-12-11 NOTE — ED Notes (Signed)
[facility] called for transport.  

## 2022-12-11 NOTE — ED Notes (Signed)
Report given to the next RN... 

## 2022-12-11 NOTE — ED Notes (Signed)
Alice Simpson with cl called for transport

## 2022-12-11 NOTE — Plan of Care (Signed)

## 2022-12-11 NOTE — ED Provider Notes (Signed)
[place] EMERGENCY DEPARTMENT AT [facility] Provider Note   CSN: [id number] Arrival date & time: 12/11/22  1143     History  Chief Complaint  Patient presents with   Sore Throat    Alice Simpson is a 57 y.o. female with a PMHx of DM, HLD, who presents to the ED with concerns for sore throat onset 4 days.  Has associated right anterior neck pain, rhinorrhea, cough x last night, mild fever (99.6 last night). Was seen again by UC today and told to come into the ED to rule out PTA with a CT scan. Has been compliant with the Clindamycin as prescribed to her. Denies trouble swallowing, shortness of breath. She notes negative at home COVID test.   Per pt chart review: Pt was evaluated at [facility] on 12/08/22 and diagnosed with strep throat. Pt was Rx Clindamycin TID x 10 days.   The history is provided by the patient. No language interpreter was used.       Home Medications Prior to Admission medications   Medication Sig Start Date End Date Taking? Authorizing Provider  Accu-Chek Softclix Lancets lancets Use as instructed to check blood sugar 1X daily 07/16/22   Carlus Pavlov, MD  Blood Glucose Monitoring Suppl (ACCU-CHEK GUIDE) w/Device KIT Use as instructed to check blood sugar 1X daily 07/16/22   Carlus Pavlov, MD  busPIRone (BUSPAR) 15 MG tablet Take 15 mg by mouth 2 (two) times daily. 07/16/22   [provider]  DULoxetine HCl 40 MG CPEP Take 40 mg by mouth daily.    [provider]  glucose blood (ACCU-CHEK GUIDE) test strip Use as instructed to check blood sugar 1X daily 07/16/22   Carlus Pavlov, MD  LORazepam (ATIVAN) 0.5 MG tablet Take 0.5 mg by mouth every 8 (eight) hours.    [provider]  losartan (COZAAR) 25 MG tablet Take 25 mg by mouth daily.    [provider]  metFORMIN (GLUCOPHAGE-XR) 500 MG 24 hr tablet Take 2 tablets (1,000 mg total) by mouth 2 (two) times daily. 02/02/22   Carlus Pavlov, MD  Multiple  Vitamin (MULTI-VITAMINS) TABS Take 1 tablet by mouth 2 (two) times daily. Prenatal    [provider]  OZEMPIC, 1 MG/DOSE, 4 MG/3ML SOPN INJECT 1mg  into THE SKIN ONCE WEEKLY 12/15/21   Carlus Pavlov, MD  prenatal vitamin w/FE, FA (PRENATAL 1 + 1) 27-1 MG TABS tablet Take 1 tablet by mouth daily at 12 noon.    [provider]  rosuvastatin (CRESTOR) 20 MG tablet Take 20 mg by mouth daily.    [provider]      Allergies    Lisinopril, Lactose intolerance (gi), Penicillin g, and Prochlorperazine    Review of Systems   Review of Systems  All other systems reviewed and are negative.   Physical Exam Updated Vital Signs BP (!) 142/86 (BP Location: Right Wrist)   Pulse 89   Temp 98.2 F (36.8 C) (Oral)   Resp 18   SpO2 100%  Physical Exam Vitals and nursing note reviewed.  Constitutional:      General: She is not in acute distress.    Appearance: Normal appearance.  HENT:     Mouth/Throat:     Comments: Uvula midline without swelling. No posterior pharyngeal erythema or tonsillar exudate noted. Patent airway. Pt able to speak in clear complete sentences. Tolerating oral secretions. Eyes:     General: No scleral icterus.    Extraocular Movements: Extraocular movements intact.  Neck:     Comments: TTP noted to anterior cervical and submandibular lymph nodes without appreciable LAD noted.  Cardiovascular:     Rate and Rhythm: Normal rate.  Pulmonary:     Effort: Pulmonary effort is normal. No respiratory distress.  Abdominal:     Palpations: Abdomen is soft. There is no mass.     Tenderness: There is no abdominal tenderness.  Musculoskeletal:        General: Normal range of motion.     Cervical back: Neck supple.  Skin:    General: Skin is warm and dry.     Findings: No rash.  Neurological:     Mental Status: She is alert.     Sensory: Sensation is intact.     Motor: Motor function is intact.  Psychiatric:        Behavior: Behavior normal.      ED Results / Procedures / Treatments   Labs (all labs ordered are listed, but only abnormal results are displayed) Labs Reviewed  BASIC METABOLIC PANEL - Abnormal; Notable for the following components:      Result Value   Glucose, Bld 237 (*)    All other components within normal limits  CBC WITH DIFFERENTIAL/PLATELET    EKG None  Radiology CT Soft Tissue Neck W Contrast  Result Date: 12/11/2022 CLINICAL DATA:  Provided history: Epiglottitis or tonsillitis suspected. Sore throat. Neck pain. The patient reports being diagnosed with strep on Tuesday, no relief with antibiotics. EXAM: CT NECK WITH CONTRAST TECHNIQUE: Multidetector CT imaging of the neck was performed using the standard protocol following the bolus administration of intravenous contrast. RADIATION DOSE REDUCTION: This exam was performed according to the departmental dose-optimization program which includes automated exposure control, adjustment of the mA and/or kV according to patient size and/or use of iterative reconstruction technique. CONTRAST:  75mL OMNIPAQUE IOHEXOL 300 MG/ML  SOLN COMPARISON:  CT angiogram neck 08/02/2018. FINDINGS: Pharynx and larynx: Streak/beam hardening artifact arising from dental restoration partially obscures the oral cavity. Mild prominence of the palatine tonsils (right greater than left). 6 mm focus of hypodensity in the region of the inferior right palatine tonsil (best appreciated on series 5, images 54 and 55). No appreciable swelling elsewhere within the oral cavity, pharynx or larynx. No retropharyngeal collection. No significant airway effacement. Salivary glands: No inflammation, mass, or stone. Thyroid: Unremarkable. Lymph nodes: Bilateral level II lymphadenopathy. An index right level 2 lymph node measures 2.2 cm in short axis (series 4, image 69). Vascular: The major vascular structures of the neck are patent. Mild atherosclerotic plaque about the carotid bifurcations, bilaterally.  Limited intracranial: No evidence of an acute intracranial abnormality within the field of view. Visualized orbits: No orbital mass or acute orbital finding. Mastoids and visualized paranasal sinuses: No significant paranasal sinus disease or mastoid effusion. Skeleton: Cervical spondylosis. Disc space narrowing is advanced at C5-C6. No acute fracture or aggressive osseous lesion. Upper chest: No consolidation within the imaged lung apices. IMPRESSION: 1. Mild prominence of the palatine tonsils, nonspecific but compatible with acute tonsillitis/pharyngitis in the appropriate clinical setting. There is a 6 mm focus of hypodensity in the region of the inferior right palatine tonsil, which may reflect a small tonsillar/peritonsillar abscess. No significant airway effacement. 2. Bilateral cervical lymphadenopathy as described. This may be reactive in the setting of an acute infection. However, recommend close clinical follow-up (with imaging follow-up as warranted) to ensure resolution and to exclude alternative etiologies. 3. Cervical spondylosis. Disc degeneration is advanced at C5-C6.  Electronically Signed   By: Jackey Loge D.O.   On: 12/11/2022 14:30    Procedures Procedures    Medications Ordered in ED Medications  meropenem (MERREM) 1 g in sodium chloride 0.9 % 100 mL IVPB (has no administration in time range)  dexamethasone (DECADRON) injection 10 mg (10 mg Intravenous Given 12/11/22 1325)  iohexol (OMNIPAQUE) 300 MG/ML solution 100 mL (75 mLs Intravenous Contrast Given 12/11/22 1340)    ED Course/ Medical Decision Making/ A&P Clinical Course as of 12/11/22 1702  Fri Dec 11, 2022  1230 During initial physical exam during anterior cervical lymph node palpation, patient grabbed this provider multiple times during examination noting pain. This provider politely removed the patients hand from my person and completed the physical exam. Discussed with patient that we will rule out any emergent findings  such as PTA or retropharyngeal abscess and if there are no emergent findings, then it is likely that this is a really bad strep that we may need to change antibiotics for. Pt agreeable. [SB]  1307 Notified by Paramedic Josh that patient was upset due to the palpation of her cervical lymph nodes and no longer wanted to be evaluated by myself. Pt re-evaluated and patient noted that there was pain on the palpation of her cervical lymph nodes. Also reports that this provider moved her hand away from her during the physical exam. Pt stated regarding this provider "She said, you probably have strep and I don't know why you came into the ED." Pt then stated "But she didn't say that at all, that's just what I got from the conversation." Pt reports that she wouldn't come into the ED unless it was pertinent and her symptoms have persisted. Offered pain meds, patient declines at this time.  [SB]  1504 Consult with ENT specialist, Dr. Jearld Fenton who notes that patient can be discharged home with doxycyline or admitted to [facility] for IV antibiotics and re-evaluation if symptoms persist.  [SB]  1512 Discussed with patient and son at bedside regarding recommendations as per ENT specialist.  At this time patient is agreeable with admission to the hospital for IV antibiotics. [SB]  1602 Consult with Hospitalist, Dr. Ella Jubilee who agrees with admission at this time.  [SB]  1620 Discussion with ED Pharmacist who recommends either Cipro 400 iv, ertapenem 1 g, merapenam 1 g, or moxifloxacin for her probable PTA treatment.   [SB]    Clinical Course User Index [SB] Maycen Degregory A, PA-C                             Medical Decision Making Amount and/or Complexity of Data Reviewed Labs: ordered. Radiology: ordered.  Risk Prescription drug management. Decision regarding hospitalization.   Patient presents to the ED with concerns for worsening sore throat. Initial vital signs patient afebrile. On exam patient with  Uvula midline without swelling. No posterior pharyngeal erythema or tonsillar exudate noted. Patent airway. Pt able to speak in clear complete sentences. Tolerating oral secretions.  No acute cardiovascular pr respiratory exam findings. Differential diagnosis includes strep, peritonsillar abscess, or retropharyngeal abscess.    Co morbidities that complicate the patient evaluation: DM, anxiety, HLD  Additional history obtained:  External records from outside source obtained and reviewed including: Pt was evaluated at [facility] on 12/08/22 and diagnosed with strep throat. Pt was Rx Clindamycin TID x 10 days.   Labs:  I ordered, and personally interpreted labs.  The pertinent results  include:   CBC without leukocytosis BMP with glucose elevated at 237 otherwise unremarkable  Imaging: I ordered imaging studies including CT neck w contrast I independently visualized and interpreted imaging which showed:  1. Mild prominence of the palatine tonsils, nonspecific but  compatible with acute tonsillitis/pharyngitis in the appropriate  clinical setting. There is a 6 mm focus of hypodensity in the region  of the inferior right palatine tonsil, which may reflect a small  tonsillar/peritonsillar abscess. No significant airway effacement.  2. Bilateral cervical lymphadenopathy as described. This may be  reactive in the setting of an acute infection. However, recommend  close clinical follow-up (with imaging follow-up as warranted) to  ensure resolution and to exclude alternative etiologies.  3. Cervical spondylosis. Disc degeneration is advanced at C5-C6.   I agree with the radiologist interpretation  Medications:  I ordered medication including Decadron, meropenem for symptom management antibiotic treatment Reevaluation of the patient after these medicines and interventions, I reevaluated the patient and found that they have improved I have reviewed the patients home medicines and have made adjustments as  needed   Consultations: I requested consultation with the ENT, Dr. Jearld Fenton and discussed lab and imaging findings as well as pertinent plan - they recommend: patient can be discharged home with doxycyline or admitted to [facility] for IV antibiotics and re-evaluation if symptoms persist.  Consult with hospitalist, Dr. Ella Jubilee who agrees with admission at this time   Disposition: Presenting suspicious for probable peritonsillar abscess.  Doubt concerns at this time for retropharyngeal abscess.  Patient with a positive strep test on 12/08/2022.  Patient with a negative at-home COVID test yesterday. After consideration of the diagnostic results and the patients response to treatment, I feel that the patient would benefit from Admission to the hospital.  Discussed with patient and son at bedside regarding plans for admission.  Patient and son agreeable at this time for plans for admission.  Patient appears safe for admission at this time.   This chart was dictated using voice recognition software, Dragon. Despite the best efforts of this provider to proofread and correct errors, errors may still occur which can change documentation meaning.   Final Clinical Impression(s) / ED Diagnoses Final diagnoses:  Peritonsillar abscess    Rx / DC Orders ED Discharge Orders     None         Peta Peachey A, PA-C 12/11/22 1831    Alvira Monday, MD 12/12/22 [phone]

## 2022-12-11 NOTE — ED Triage Notes (Signed)
Pt presents from urgent care with complaints of sore throat and neck pain. Pt reports she was diagnose with strep and tuesday and placed on antibiotics but no relief. When back today and told too come to to ER for Ct to rule out tonsillar abcess
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
## 2022-12-11 NOTE — Progress Notes (Signed)
Arrived to 6n30 by PTAR at this time. Ambulated to BR- gait steady. C/O throat/neck pain 7/10. Bed in low position, bed alarm on, instructed on usage of bed.

## 2022-12-12 DIAGNOSIS — J36 Peritonsillar abscess: Secondary | ICD-10-CM | POA: Diagnosis not present

## 2022-12-12 LAB — GLUCOSE, CAPILLARY
Glucose-Capillary: 140 mg/dL — ABNORMAL HIGH (ref 70–99)
Glucose-Capillary: 297 mg/dL — ABNORMAL HIGH (ref 70–99)
Glucose-Capillary: 288 mg/dL — ABNORMAL HIGH (ref 70–99)
Glucose-Capillary: 158 mg/dL — ABNORMAL HIGH (ref 70–99)

## 2022-12-12 MED ORDER — SODIUM CHLORIDE 0.9 % IV SOLN
2.0000 g | INTRAVENOUS | Status: DC
  Administered 2022-12-12: 2 g via INTRAVENOUS
  Filled 2022-12-12: qty 20

## 2022-12-12 MED ORDER — METHOCARBAMOL 500 MG PO TABS
500.0000 mg | ORAL_TABLET | Freq: Three times a day (TID) | ORAL | Status: DC | PRN
  Administered 2022-12-12: 500 mg via ORAL
  Filled 2022-12-12 (×2): qty 1

## 2022-12-12 MED ORDER — ZOLPIDEM TARTRATE 5 MG PO TABS
5.0000 mg | ORAL_TABLET | Freq: Every evening | ORAL | Status: DC | PRN
  Administered 2022-12-12: 5 mg via ORAL
  Filled 2022-12-12: qty 1

## 2022-12-12 MED ORDER — METRONIDAZOLE 500 MG PO TABS
500.0000 mg | ORAL_TABLET | Freq: Two times a day (BID) | ORAL | Status: AC
  Administered 2022-12-12 (×2): 500 mg via ORAL
  Filled 2022-12-12 (×2): qty 1

## 2022-12-12 MED ORDER — FAMOTIDINE 20 MG PO TABS
20.0000 mg | ORAL_TABLET | Freq: Every day | ORAL | Status: DC
  Administered 2022-12-12 – 2022-12-13 (×2): 20 mg via ORAL
  Filled 2022-12-12 (×2): qty 1

## 2022-12-12 MED ORDER — KETOROLAC TROMETHAMINE 15 MG/ML IJ SOLN
15.0000 mg | Freq: Four times a day (QID) | INTRAMUSCULAR | Status: DC
  Administered 2022-12-12 – 2022-12-13 (×4): 15 mg via INTRAVENOUS
  Filled 2022-12-12 (×4): qty 1

## 2022-12-12 MED ORDER — DIPHENHYDRAMINE HCL 25 MG PO CAPS: 25.0000 mg | ORAL_CAPSULE | Freq: Four times a day (QID) | ORAL | Status: DC | PRN

## 2022-12-12 NOTE — Hospital Course (Addendum)
Brief hospital course: PMH type II DM, HTN, GAD, obesity presented to the hospital with complaints of sore throat. 7/24 was seen in the urgent care was found to have group A strep pharyngitis. Was discharged on clindamycin 300 mg 3 times daily. Presents to ER on 7/26 with persistent fever, worsening pain and swelling around her right facial area. Found to have peritonsillar abscess.  ENT Dr. Jearld Fenton recommended conservative management with IV antibiotics. Assessment and Plan: Right peritonsillar abscess. Group A strep infection. Patient was on clindamycin for 2 days prior to admission CT neck shows evidence of 6 mm right-sided peritonsillar abscess. Started on IV meropenem with IV Decadron. Currently swelling significantly improving.  Pain improving as well. Due to diabetes will stop Decadron and switch to Toradol. Check MRSA PCR.  Switch meropenem to ceftriaxone Flagyl and vancomycin. Most likely will continue Zyvox and Ceftin on discharge. Outpatient follow-up with ENT recommended.  Penicillin allergy. Around 57 years of age patient had reaction with penicillin with hives and lip swelling. Recommend considering amoxicillin challenge once she is clinically better.  Type 2 diabetes mellitus, uncontrolled with hyperglycemia secondary to steroids with long-term insulin use without complication. Continue sliding scale insulin. Holding Ozempic. Recent A1c 6.0.  HTN. Continue losartan.  HLD. Continue statin.  Anxiety.  And depression Continue BuSpar as well as Cymbalta.  Class III obesity. BMI 40+.  Last from 41.03. On Ozempic. Placing the patient at high risk of poor outcome.

## 2022-12-12 NOTE — Progress Notes (Addendum)
[facility] Progress Note Patient: Alice Simpson Brosh WGN:[id number] DOB: 04/03/1966 DOA: 12/11/2022  DOS: the patient was seen and examined on 12/12/2022  Brief hospital course: PMH type II DM, HTN, GAD, obesity presented to the hospital with complaints of sore throat. 7/24 was seen in the urgent care was found to have group A strep pharyngitis. Was discharged on clindamycin 300 mg 3 times daily. Presents to ER on 7/26 with persistent fever, worsening pain and swelling around her right facial area. Found to have peritonsillar abscess.  ENT Dr. Jearld Fenton recommended conservative management with IV antibiotics. Assessment and Plan: Right peritonsillar abscess. Group A strep infection. Patient was on clindamycin for 2 days prior to admission CT neck shows evidence of 6 mm right-sided peritonsillar abscess. Started on IV meropenem with IV Decadron. Currently swelling significantly improving.  Pain improving as well. Due to diabetes will stop Decadron and switch to Toradol. Check MRSA PCR.  Switch meropenem to ceftriaxone Flagyl and vancomycin. Most likely will continue Zyvox and Ceftin on discharge. Outpatient follow-up with ENT recommended.  Penicillin allergy. Around 57 years of age patient had reaction with penicillin with hives and lip swelling. Recommend considering amoxicillin challenge once she is clinically better.  Type 2 diabetes mellitus, uncontrolled with hyperglycemia secondary to steroids with long-term insulin use without complication. Continue sliding scale insulin. Holding Ozempic. Recent A1c 6.0.  HTN. Continue losartan.  HLD. Continue statin.  Anxiety.  And depression Continue BuSpar as well as Cymbalta.  Class III obesity. BMI 40+.  Last from 41.03. On Ozempic. Placing the patient at high risk of poor outcome.   Addendum: Pt reported that her jaw feels locked up and warm, face feels flushed and has some itching on her tongue after getting ceftraixone.   Examined the pt at bedside. No stridor, no difference in jaw swelling compared to before, no rash on IV site or on face. She is talking in full sentences. No throat or lip swelling.  Less likely allergy.  Keep ceftriaxone  Give robaxin, pepcid. Benadryl PRN.   Asking for stronger medicine for insomnia  Will give her [place] 5:22 PM 12/12/2022     Subjective: Improving throat pain.  No nausea no vomiting no fever no chills.  Physical Exam: General: in Mild distress, No Rash, no stridor.  Neck swelling on right side.  No facial cellulitis. Cardiovascular: S1 and S2 Present, No Murmur Respiratory: Good respiratory effort, Bilateral Air entry present. No Crackles, No wheezes Abdomen: Bowel Sound present, No tenderness Extremities: No edema Neuro: Alert and oriented x3, no new focal deficit  Data Reviewed: I have Reviewed nursing notes, Vitals, and Lab results. Since last encounter, pertinent lab results CBC and BMP   . I have ordered test including CBC BMP MRSA PCR ESR CRP  .   Disposition: Status is: Inpatient Remains inpatient appropriate because: Needing IV antibiotics  SCDs Start: 12/11/22 2027   Family Communication: No one at bedside Level of care: Med-Surg   Vitals:   12/12/22 0042 12/12/22 0447 12/12/22 0722 12/12/22 1535  BP: 134/74 134/77 117/67 (!) 106/49  Pulse: 61 (!) 56 63 65  Resp:   16 16  Temp: 97.9 F (36.6 C) 97.6 F (36.4 C) 98.1 F (36.7 C) 97.7 F (36.5 C)  TempSrc: Oral Oral Oral Oral  SpO2: 96% 97% 99% 98%     Author: Lynden Oxford, MD 12/12/2022 5:00 PM  Please look on [email or web address] to find out who is on call.

## 2022-12-13 DIAGNOSIS — J36 Peritonsillar abscess: Secondary | ICD-10-CM | POA: Diagnosis not present

## 2022-12-13 LAB — CBC WITH DIFFERENTIAL/PLATELET
Abs Immature Granulocytes: 0.08 10*3/uL — ABNORMAL HIGH (ref 0.00–0.07)
Basophils Absolute: 0 10*3/uL (ref 0.0–0.1)
Basophils Relative: 0 %
Eosinophils Absolute: 0 10*3/uL (ref 0.0–0.5)
Eosinophils Relative: 0 %
HCT: 35.6 % — ABNORMAL LOW (ref 36.0–46.0)
Hemoglobin: 11.9 g/dL — ABNORMAL LOW (ref 12.0–15.0)
Immature Granulocytes: 1 %
Lymphocytes Relative: 22 %
Lymphs Abs: 3.3 10*3/uL (ref 0.7–4.0)
MCH: 27.2 pg (ref 26.0–34.0)
MCHC: 33.4 g/dL (ref 30.0–36.0)
MCV: 81.3 fL (ref 80.0–100.0)
Monocytes Absolute: 1 10*3/uL (ref 0.1–1.0)
Monocytes Relative: 7 %
Neutro Abs: 11 10*3/uL — ABNORMAL HIGH (ref 1.7–7.7)
Neutrophils Relative %: 70 %
Platelets: 288 10*3/uL (ref 150–400)
RBC: 4.38 MIL/uL (ref 3.87–5.11)
RDW: 12.3 % (ref 11.5–15.5)
WBC: 15.4 10*3/uL — ABNORMAL HIGH (ref 4.0–10.5)
nRBC: 0 % (ref 0.0–0.2)

## 2022-12-13 LAB — COMPREHENSIVE METABOLIC PANEL WITH GFR
ALT: 19 U/L (ref 0–44)
AST: 20 U/L (ref 15–41)
Albumin: 2.7 g/dL — ABNORMAL LOW (ref 3.5–5.0)
Alkaline Phosphatase: 82 U/L (ref 38–126)
Anion gap: 11 (ref 5–15)
BUN: 20 mg/dL (ref 6–20)
CO2: 22 mmol/L (ref 22–32)
Calcium: 9.1 mg/dL (ref 8.9–10.3)
Chloride: 102 mmol/L (ref 98–111)
Creatinine, Ser: 0.84 mg/dL (ref 0.44–1.00)
GFR, Estimated: >60 mL/min (ref >60)
Glucose, Bld: 354 mg/dL — ABNORMAL HIGH (ref 70–99)
Potassium: 4.3 mmol/L (ref 3.5–5.1)
Sodium: 135 mmol/L (ref 135–145)
Total Bilirubin: 0.5 mg/dL (ref 0.3–1.2)
Total Protein: 6.1 g/dL — ABNORMAL LOW (ref 6.5–8.1)

## 2022-12-13 LAB — C-REACTIVE PROTEIN: CRP: 2.9 mg/dL — ABNORMAL HIGH (ref <1.0)

## 2022-12-13 LAB — GLUCOSE, CAPILLARY
Glucose-Capillary: 107 mg/dL — ABNORMAL HIGH (ref 70–99)
Glucose-Capillary: 213 mg/dL — ABNORMAL HIGH (ref 70–99)

## 2022-12-13 LAB — SEDIMENTATION RATE: Sed Rate: 47 mm/h — ABNORMAL HIGH (ref 0–22)

## 2022-12-13 MED ORDER — CEFDINIR 300 MG PO CAPS: 300.0000 mg | ORAL_CAPSULE | Freq: Two times a day (BID) | ORAL | 0 refills | Status: AC

## 2022-12-13 MED ORDER — VANCOMYCIN HCL IN DEXTROSE 1-5 GM/200ML-% IV SOLN
1000.0000 mg | Freq: Two times a day (BID) | INTRAVENOUS | Status: DC
  Filled 2022-12-13: qty 200

## 2022-12-13 MED ORDER — FAMOTIDINE 20 MG PO TABS: 20.0000 mg | ORAL_TABLET | Freq: Every day | ORAL | 0 refills | Status: DC

## 2022-12-13 MED ORDER — LINEZOLID 600 MG PO TABS
600.0000 mg | ORAL_TABLET | Freq: Two times a day (BID) | ORAL | Status: DC
  Administered 2022-12-13: 600 mg via ORAL
  Filled 2022-12-13 (×2): qty 1

## 2022-12-13 MED ORDER — LINEZOLID 600 MG PO TABS: 600.0000 mg | ORAL_TABLET | Freq: Two times a day (BID) | ORAL | 0 refills | Status: AC

## 2022-12-13 MED ORDER — CEFDINIR 300 MG PO CAPS
300.0000 mg | ORAL_CAPSULE | Freq: Two times a day (BID) | ORAL | Status: DC
  Administered 2022-12-13: 300 mg via ORAL
  Filled 2022-12-13 (×2): qty 1

## 2022-12-13 MED ORDER — NAPROXEN 500 MG PO TBEC: 500.0000 mg | DELAYED_RELEASE_TABLET | Freq: Two times a day (BID) | ORAL | 0 refills | Status: AC

## 2022-12-13 NOTE — Progress Notes (Signed)
Pharmacy Antibiotic Note  Alice Simpson is a 57 y.o. female admitted on 12/11/2022 with  peritonsillar abscess 2/2 strep A infection .  Pharmacy has been consulted for vancomycin dosing.  Patient initially received clindamycin as an outpatient without improvement. On admission she was started on meropenem d/t penicillin allergy (hives, remote). She was then de-escalated to ceftriaxone and metronidazole on 7/27.   WBC count has increased to 15.4, she is afebrile at 98.4 but has been receiving acetaminophen and ketorolac. No culture data available. A nasal MRSA PCR has been sent.   Creatinine is at her baseline <1, today at 0.84.   Expected AUC: 459 SCr used: 0.84 Vd used: 0.5 d/t BMI   Plan: Start vancomycin 1000 mg IV Q12hours Goal AUC 400-550. Continue ceftriaxone 2 grams IV Q24hours  Metronidazole discontinued per MD  Follow up clinical status, renal function  Will likely not need vancomycin levels   Weight: 127.2 kg (280 lb 6.8 oz)  Temp (24hrs), Avg:98.1 F (36.7 C), Min:97.7 F (36.5 C), Max:98.4 F (36.9 C)  Recent Labs  Lab 12/11/22 1229 12/12/22 0047 12/13/22 0204  WBC 9.0 6.4 15.4*  CREATININE 0.61 0.65 0.84    Estimated Creatinine Clearance: 105.7 mL/min (by C-G formula based on SCr of 0.84 mg/dL).    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  Fara Olden, PharmD, BCPS, BCOP Clinical Pharmacist 12/13/2022 9:12 AM

## 2022-12-13 NOTE — Plan of Care (Signed)
  Problem: Education: Goal: Knowledge of General Education information will improve Description Including pain rating scale, medication(s)/side effects and non-pharmacologic comfort measures Outcome: Adequate for Discharge   Problem: Health Behavior/Discharge Planning: Goal: Ability to manage health-related needs will improve Outcome: Adequate for Discharge   

## 2022-12-14 NOTE — Discharge Summary (Signed)
Physician Discharge Summary   Patient: Alice Simpson MRN: [id number] DOB: 1965-12-21  Admit date:     12/11/2022  Discharge date: 12/13/2022  Discharge Physician: Lynden Oxford  PCP: Farris Has, MD  Recommendations at discharge: Follow-up with PCP in 1 week. Follow-up with ENT as recommended.   Follow-up Information     Farris Has, MD. Schedule an appointment as soon as possible for a visit in 1 week(s).   Specialty: Family Medicine Contact information: [address] [place] [address] [phone]         Suzanna Obey, MD. Schedule an appointment as soon as possible for a visit in 1 week(s).   Specialty: Otolaryngology Contact information: [address] [place] [address] [place] [address] [phone]                Discharge Diagnoses: Principal Problem:   Peritonsillar abscess Active Problems:   Depression   Hyperlipidemia   Streptococcal infection group A   Acute pharyngitis   DM2 (diabetes mellitus, type 2) (HCC)   Essential hypertension   GAD (generalized anxiety disorder)  Brief hospital course: PMH type II DM, HTN, GAD, obesity presented to the hospital with complaints of sore throat. 7/24 was seen in the urgent care was found to have group A strep pharyngitis. Was discharged on clindamycin 300 mg 3 times daily. Presents to ER on 7/26 with persistent fever, worsening pain and swelling around her right facial area. Found to have peritonsillar abscess.  ENT Dr. Jearld Fenton recommended conservative management with IV antibiotics. Assessment and Plan: Right peritonsillar abscess. Group A strep infection. Patient was on clindamycin for 2 days prior to admission CT neck shows evidence of 6 mm right-sided peritonsillar abscess. Started on IV meropenem with IV Decadron. Currently swelling significantly improving.  Pain improving as well. Due to diabetes will stop Decadron and switch to Toradol. Meropenem was switched to  ceftriaxone Flagyl and vancomycin. Patient was discharged on oral Zyvox and cefdinir.  With plans to follow-up with ENT outpatient.  Penicillin allergy. Around 57 years of age patient had reaction with penicillin with hives and lip swelling. Recommend considering amoxicillin challenge once she is clinically better.  Type 2 diabetes mellitus, uncontrolled with hyperglycemia secondary to steroids with long-term insulin use without complication. Continue sliding scale insulin. Holding Ozempic. Recent A1c 6.0.  HTN. Continue losartan.  HLD. Continue statin.  Anxiety.  And depression Continue BuSpar as well as Cymbalta.  Class III obesity. BMI 40+.  Last from 41.03. On Ozempic. Placing the patient at high risk of poor outcome.   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   cefdinir 300 MG capsule Commonly known as: OMNICEF Take 1 capsule (300  mg total) by mouth 2 (two) times daily for 5 days.   DULoxetine 60 MG capsule Commonly known as: CYMBALTA Take 60 mg by mouth daily.   famotidine 20 MG tablet Commonly known as: PEPCID Take 1 tablet (20 mg total) by mouth daily for 6 days.   linezolid 600 MG tablet Commonly known as: ZYVOX Take 1 tablet (600 mg total) by mouth 2 (two) times daily for 5  days.   LORazepam 0.5 MG tablet Commonly known as: ATIVAN Take 0.5 mg by mouth every 8 (eight) hours as needed for anxiety.   losartan 25 MG tablet Commonly known as: COZAAR Take 25 mg by mouth daily.   metFORMIN 500 MG 24 hr tablet Commonly known as: GLUCOPHAGE-XR Take 2 tablets (1,000 mg total) by mouth 2 (two) times daily.   Multi-Vitamins Tabs Take 1 tablet by mouth 2 (two) times daily. Prenatal   naproxen 500 MG EC tablet Commonly known as: EC NAPROSYN Take 1 tablet (500 mg total) by mouth 2 (two) times daily with a meal for 7 days.   Ozempic (1 MG/DOSE) 4 MG/3ML Sopn Generic drug: Semaglutide (1 MG/DOSE) INJECT 1mg  into THE SKIN ONCE WEEKLY What changed: See the new instructions.   prenatal vitamin w/FE, FA 27-1 MG Tabs tablet Take 1 tablet by mouth daily at 12 noon.   rosuvastatin 40 MG tablet Commonly known as: CRESTOR Take 40 mg by mouth daily.       Disposition: Home Diet recommendation: Cardiac diet  Discharge Exam: Vitals:   12/12/22 2042 12/13/22 0500 12/13/22 0615 12/13/22 0840  BP: (!) 142/65  122/61 120/77  Pulse: (!) 55  (!) 58 (!) 59  Resp: 18  17 14   Temp: 98 F (36.7 C)  98.4 F (36.9 C) 98.3 F (36.8 C)  TempSrc: Oral   Oral  SpO2: 97%  98% 99%  Weight:  127.2 kg     General: Appear in mild distress; no visible Abnormal Neck Mass Or lumps, Conjunctiva normal, mild swelling of right jaw area Cardiovascular: S1 and S2 Present, no Murmur, Respiratory: good respiratory effort, Bilateral Air entry present and CTA, no Crackles, no wheezes, no stridor Abdomen: Bowel Sound present, Non tender  Extremities: no Pedal edema Neurology: alert and oriented to time, place, and person  Filed Weights   12/13/22 0500  Weight: 127.2 kg   Condition at discharge: stable  The results of significant diagnostics from this hospitalization (including imaging, microbiology, ancillary and laboratory) are listed below for reference.   Imaging Studies: CT  Soft Tissue Neck W Contrast  Result Date: 12/11/2022 CLINICAL DATA:  Provided history: Epiglottitis or tonsillitis suspected. Sore throat. Neck pain. The patient reports being diagnosed with strep on Tuesday, no relief with antibiotics. EXAM: CT NECK WITH CONTRAST TECHNIQUE: Multidetector CT imaging of the neck was performed using the standard protocol following the bolus administration of intravenous contrast. RADIATION DOSE REDUCTION: This exam was performed according to the departmental dose-optimization program which includes automated exposure control, adjustment of the mA and/or kV according to patient size and/or use of iterative reconstruction technique. CONTRAST:  75mL OMNIPAQUE IOHEXOL 300 MG/ML  SOLN COMPARISON:  CT angiogram neck 08/02/2018. FINDINGS: Pharynx and larynx: Streak/beam hardening artifact arising from dental restoration partially obscures the oral cavity. Mild prominence of the palatine tonsils (right greater than left). 6 mm focus of hypodensity in the region of the inferior right palatine tonsil (best appreciated on series 5, images 54 and 55). No appreciable swelling elsewhere within the oral cavity, pharynx  or larynx. No retropharyngeal collection. No significant airway effacement. Salivary glands: No inflammation, mass, or stone. Thyroid: Unremarkable. Lymph nodes: Bilateral level II lymphadenopathy. An index right level 2 lymph node measures 2.2 cm in short axis (series 4, image 69). Vascular: The major vascular structures of the neck are patent. Mild atherosclerotic plaque about the carotid bifurcations, bilaterally. Limited intracranial: No evidence of an acute intracranial abnormality within the field of view. Visualized orbits: No orbital mass or acute orbital finding. Mastoids and visualized paranasal sinuses: No significant paranasal sinus disease or mastoid effusion. Skeleton: Cervical spondylosis. Disc space narrowing is advanced at C5-C6. No acute fracture or aggressive  osseous lesion. Upper chest: No consolidation within the imaged lung apices. IMPRESSION: 1. Mild prominence of the palatine tonsils, nonspecific but compatible with acute tonsillitis/pharyngitis in the appropriate clinical setting. There is a 6 mm focus of hypodensity in the region of the inferior right palatine tonsil, which may reflect a small tonsillar/peritonsillar abscess. No significant airway effacement. 2. Bilateral cervical lymphadenopathy as described. This may be reactive in the setting of an acute infection. However, recommend close clinical follow-up (with imaging follow-up as warranted) to ensure resolution and to exclude alternative etiologies. 3. Cervical spondylosis. Disc degeneration is advanced at C5-C6. Electronically Signed   By: Jackey Loge D.O.   On: 12/11/2022 14:30    Microbiology: Results for orders placed or performed during the hospital encounter of 03/28/19  SARS CORONAVIRUS 2 (TAT 6-24 HRS) Nasopharyngeal Nasopharyngeal Swab     Status: None   Collection Time: 03/28/19  9:42 AM   Specimen: Nasopharyngeal Swab  Result Value Ref Range Status   SARS Coronavirus 2 NEGATIVE NEGATIVE Final    Comment: (NOTE) SARS-CoV-2 target nucleic acids are NOT DETECTED. The SARS-CoV-2 RNA is generally detectable in upper and lower respiratory specimens during the acute phase of infection. Negative results do not preclude SARS-CoV-2 infection, do not rule out co-infections with other pathogens, and should not be used as the sole basis for treatment or other patient management decisions. Negative results must be combined with clinical observations, patient history, and epidemiological information. The expected result is Negative. Fact Sheet for Patients: HairSlick.no Fact Sheet for Healthcare Providers: quierodirigir.com This test is not yet approved or cleared by the [place] FDA and  has been authorized for detection and/or  diagnosis of SARS-CoV-2 by FDA under an Emergency Use Authorization (EUA). This EUA will remain  in effect (meaning this test can be used) for the duration of the COVID-19 declaration under Section 56 4(b)(1) of the Act, 21 U.S.C. section 360bbb-3(b)(1), unless the authorization is terminated or revoked sooner. Performed at [facility], [address], [place], [place] [address]    Labs: CBC: Recent Labs  Lab 12/11/22 1229 12/12/22 0047 12/13/22 0204  WBC 9.0 6.4 15.4*  NEUTROABS 5.1 4.8 11.0*  HGB 12.9 12.5 11.9*  HCT 39.3 37.8 35.6*  MCV 84.3 82.4 81.3  PLT 261 275 288   Basic Metabolic Panel: Recent Labs  Lab 12/11/22 1229 12/12/22 0047 12/13/22 0204  NA 140 135 135  K 3.7 4.5 4.3  CL 104 102 102  CO2 25 24 22   GLUCOSE 237* 320* 354*  BUN 12 9 20   CREATININE 0.61 0.65 0.84  CALCIUM 9.2 9.0 9.1  MG  --  1.7  --    Liver Function Tests: Recent Labs  Lab 12/12/22 0047 12/13/22 0204  AST 22 20  ALT 22 19  ALKPHOS 92 82  BILITOT 0.5 0.5  PROT 6.7 6.1*  ALBUMIN 2.9* 2.7*   CBG: Recent Labs  Lab 12/12/22 1220 12/12/22 1703 12/12/22 2056 12/13/22 0850 12/13/22 1214  GLUCAP 297* 288* 158* 107* 213*    Discharge time spent: greater than 30 minutes.  Author: Lynden Oxford, MD  [facility] 12/13/2022

## 2022-12-15 ENCOUNTER — Telehealth: Payer: Self-pay | Admitting: Otolaryngology

## 2022-12-15 NOTE — Telephone Encounter (Signed)
Pt was in ED this weekend for Peritonsillar abscess and was told to follow up with Dr. Jearld Fenton next week.  Please advise where you would like her to be scheduled.

## 2022-12-21 ENCOUNTER — Ambulatory Visit (INDEPENDENT_AMBULATORY_CARE_PROVIDER_SITE_OTHER): Payer: Medicaid Other | Admitting: Otolaryngology

## 2022-12-21 ENCOUNTER — Encounter (INDEPENDENT_AMBULATORY_CARE_PROVIDER_SITE_OTHER): Payer: Self-pay | Admitting: Otolaryngology

## 2022-12-21 VITALS — BP 133/73 | HR 104 | Ht 68.0 in | Wt 270.0 lb

## 2022-12-21 DIAGNOSIS — M47812 Spondylosis without myelopathy or radiculopathy, cervical region: Secondary | ICD-10-CM

## 2022-12-21 DIAGNOSIS — J029 Acute pharyngitis, unspecified: Secondary | ICD-10-CM

## 2022-12-21 DIAGNOSIS — M542 Cervicalgia: Secondary | ICD-10-CM

## 2022-12-21 DIAGNOSIS — J3501 Chronic tonsillitis: Secondary | ICD-10-CM

## 2022-12-21 DIAGNOSIS — J351 Hypertrophy of tonsils: Secondary | ICD-10-CM | POA: Diagnosis not present

## 2022-12-21 DIAGNOSIS — J36 Peritonsillar abscess: Secondary | ICD-10-CM

## 2022-12-21 NOTE — Progress Notes (Signed)
ENT CONSULT:  Reason for Consult: strep throat and 6 mm R PTA   HPI: Alice Simpson is an 57 y.o. female with hx of recurrent strep throat infections in the past, here for f/u after ED visit for tonsillitis and 6 mm PTA. She reports she had a fever (101.6) and sore throat, had (+) Strep and saw PCP, who gave her Clindamycin. Then she had neck pain with head turning and continued to have fevers, and went to PCP again, and was told she had an abscess in her throat and was sent to ED. She was started on Meropenem/IV abx and was admitted to the hospital x 2 days. Her sx improved. She was discharged on Linezolid. She is allergic to [facility]. On admission her WBC was normal. After steroids on 12/13/22 WBC was 15.4.   Records Reviewed:  Epic records and ED visit and admission  Hx of T2DM not on insulin   Pt was evaluated at [facility] on 12/08/22 and diagnosed with strep throat. Pt was Rx Clindamycin TID x 10 days     Patient presents to the ED with concerns for worsening sore throat. Initial vital signs patient afebrile. On exam patient with Uvula midline without swelling. No posterior pharyngeal erythema or tonsillar exudate noted. Patent airway. Pt able to speak in clear complete sentences. Tolerating oral secretions.  No acute cardiovascular pr respiratory exam findings. Differential diagnosis includes strep, peritonsillar abscess, or retropharyngeal abscess.  I requested consultation with the ENT, Dr. Jearld Fenton and discussed lab and imaging findings as well as pertinent plan - they recommend: patient can be discharged home with doxycyline or admitted to [facility] for IV antibiotics and re-evaluation if symptoms persist.  Consult with hospitalist, Dr. 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   Vertigo     Past Surgical History:  Procedure Laterality Date   BUNIONECTOMY     CESAREAN SECTION     LAPAROSCOPIC GASTRIC SLEEVE RESECTION N/A 09/24/2014   Procedure: LAPAROSCOPIC GASTRIC SLEEVE RESECTION upper endoscopy;  Surgeon: Luretha Murphy, MD;  Location: [facility];  Service: General;  Laterality: N/A;   RADIOACTIVE SEED GUIDED EXCISIONAL BREAST BIOPSY Left 03/31/2019   Procedure: RADIOACTIVE SEED GUIDED EXCISIONAL LEFT BREAST BIOPSY X2;  Surgeon: Luretha Murphy, MD;  Location: [facility];  Service: General;  Laterality: Left;   TOTAL HIP ARTHROPLASTY Right 11/20/2014   TOTAL HIP ARTHROPLASTY Right 11/20/2014   Procedure: TOTAL HIP ARTHROPLASTY ANTERIOR APPROACH;  Surgeon: Marcene Corning, MD;  Location: [facility];  Service: Orthopedics;  Laterality: Right;   wisdom     WISDOM TOOTH EXTRACTION      Family History  Problem Relation Age of Onset   Heart disease Mother    High blood pressure Mother    High Cholesterol Mother    Diabetes Mother    Atrial fibrillation Mother    Diabetes Father    High blood pressure Father    High Cholesterol Father    Diabetes Other    Depression Other    Anxiety disorder Other    Heart disease Half-Sister  Heart attack Half-Sister    Breast cancer Neg Hx     Social History:  reports that she quit smoking about 18 years ago. Her smoking use included cigarettes. She started smoking about 43 years ago. She has a 25 pack-year smoking history. She has never used smokeless tobacco. She reports current alcohol use. She reports current drug use. Drug: Marijuana.  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  The PMH, PSH, Medications, Allergies, and SH were reviewed and updated.  ROS: Constitutional: Negative for fever, weight loss and weight gain. Cardiovascular: Negative for chest pain and dyspnea on exertion. Respiratory: Is not experiencing shortness of breath at rest. Gastrointestinal: Negative for nausea and vomiting. Neurological: Negative for headaches. Psychiatric: The patient is not nervous/anxious  Blood pressure 133/73, pulse (!) 104, height 5\' 8"  (1.727 m), weight 270 lb (122.5 kg), SpO2 95%.  PHYSICAL EXAM:  Exam: General: Well-developed, well-nourished Communication and Voice: Clear pitch and clarity Respiratory Respiratory effort: Equal inspiration and expiration without stridor Cardiovascular Peripheral Vascular: Warm extremities with equal color/perfusion Eyes: No nystagmus with equal extraocular motion bilaterally Neuro/Psych/Balance: Patient oriented to person, place, and time; Appropriate mood and affect; Gait is intact with no imbalance; Cranial nerves I-XII are intact Head and Face Inspection: Normocephalic and atraumatic without mass or lesion Palpation: Facial skeleton intact without bony stepoffs Salivary Glands: No mass or tenderness Facial Strength: Facial motility symmetric and full bilaterally ENT Pinna: External ear intact and fully developed External canal: Canal is patent with intact skin Tympanic Membrane: Clear and mobile External Nose: No scar or anatomic deformity Internal Nose: Septum is straight on anterior rhinoscopy. No polyp, or purulence. Mucosal edema and erythema present.  Bilateral inferior turbinate hypertrophy.  Lips, Teeth, and gums: Mucosa and teeth intact and viable TMJ: No pain to palpation with full mobility Oral cavity/oropharynx: No erythema or exudate, no lesions present. 1+ tonsillar hypertrophy. No trismus. No soft palate bulging, no uvular deviation.  Neck Neck and Trachea:  Midline trachea without mass or lesion Thyroid: No mass or nodularity Lymphatics: No lymphadenopathy  Procedure: none  Studies Reviewed: CT neck with contrast 12/11/22 EXAM: CT NECK WITH CONTRAST   TECHNIQUE: Multidetector CT imaging of the neck was performed using the standard protocol following the bolus administration of intravenous contrast.   RADIATION DOSE REDUCTION: This exam was performed according to the departmental dose-optimization program which includes automated exposure control, adjustment of the mA and/or kV according to patient size and/or use of iterative reconstruction technique.   CONTRAST:  75mL OMNIPAQUE IOHEXOL 300 MG/ML  SOLN   COMPARISON:  CT angiogram neck 08/02/2018.   FINDINGS: Pharynx and larynx: Streak/beam hardening artifact arising from dental restoration partially obscures the oral cavity. Mild prominence of the palatine tonsils (right greater than left). 6 mm focus of hypodensity in the region of the inferior right palatine tonsil (best appreciated on series 5, images 54 and 55). No appreciable swelling elsewhere within the oral cavity, pharynx or larynx. No retropharyngeal collection. No significant airway effacement.   Salivary glands: No inflammation, mass, or stone.   Thyroid: Unremarkable.   Lymph nodes: Bilateral level II lymphadenopathy. An index right level 2 lymph node measures 2.2 cm in short axis (series  4, image 69).   Vascular: The major vascular structures of the neck are patent. Mild atherosclerotic plaque about the carotid bifurcations, bilaterally.   Limited intracranial: No evidence of an acute intracranial abnormality within the field of view.   Visualized orbits: No orbital mass or acute orbital finding.   Mastoids and visualized paranasal sinuses: No significant paranasal sinus disease or mastoid effusion.   Skeleton: Cervical spondylosis. Disc space narrowing is advanced at C5-C6. No acute fracture or  aggressive osseous lesion.   Upper chest: No consolidation within the imaged lung apices.   IMPRESSION: 1. Mild prominence of the palatine tonsils, nonspecific but compatible with acute tonsillitis/pharyngitis in the appropriate clinical setting. There is a 6 mm focus of hypodensity in the region of the inferior right palatine tonsil, which may reflect a small tonsillar/peritonsillar abscess. No significant airway effacement. 2. Bilateral cervical lymphadenopathy as described. This may be reactive in the setting of an acute infection. However, recommend close clinical follow-up (with imaging follow-up as warranted) to ensure resolution and to exclude alternative etiologies. 3. Cervical spondylosis. Disc degeneration is advanced at C5-C6.      Assessment/Plan: Encounter Diagnoses  Name Primary?   Tonsillar hypertrophy Yes   Peritonsillar abscess    Chronic tonsillitis    Sore throat    Spondylosis of cervical spine    Cervicalgia    57 yoF hx of T2DM, here after an episode of pharyngitis and 6 mm collection in right peritonsillar space on CT soft tissue neck done in ED 12/11/22. She is s/p IV abx and steroids, was admitted to the hospital x 2 days, got IV abx and steroids. Sx nearly completely resolved, has mild throat discomfort still. On exam, no evidence of pharyngitis or PTA. No erythema or exudate, no lesions present. 1+ tonsillar hypertrophy. No trismus. No soft palate bulging, no uvular deviation. I reviewed CT neck results and imaging with the patient and advised that she does not need any interventions for PTA on the right side, due to small size 6 mm, no clinical evidence of persistent collection and sx resolution. She never had PTA in the past. Reports strep throat 4 yrs ago, and this was her last episode before July pharyngitis. No indication for tonsillectomy at this time. She will return as needed, if develops another PTA or recurrent tonsillitis/pharyngitis. I advised she  sees her PCP to get tested for [facility] allergy 2/2 need for alternatives for IV abx she had to receive in ED (unclear if she is really allergic to [facility] but had to be given broad-spectrum abx).   - RTC as needed  Thank you for allowing me to participate in the care of this patient. Please do not hesitate to contact me with any questions or concerns.   Ashok Croon, MD Otolaryngology [facility] Phone: [phone] Fax: [phone]    12/21/2022, 11:58 AM

## 2023-01-13 ENCOUNTER — Other Ambulatory Visit: Payer: Self-pay | Admitting: Internal Medicine

## 2023-01-14 ENCOUNTER — Encounter: Payer: Self-pay | Admitting: Internal Medicine

## 2023-01-14 ENCOUNTER — Ambulatory Visit: Payer: Medicaid Other | Admitting: Internal Medicine

## 2023-01-14 VITALS — BP 120/76 | HR 79 | Ht 68.0 in | Wt 277.8 lb

## 2023-01-14 DIAGNOSIS — E1165 Type 2 diabetes mellitus with hyperglycemia: Secondary | ICD-10-CM

## 2023-01-14 DIAGNOSIS — Z7984 Long term (current) use of oral hypoglycemic drugs: Secondary | ICD-10-CM | POA: Diagnosis not present

## 2023-01-14 DIAGNOSIS — E782 Mixed hyperlipidemia: Secondary | ICD-10-CM

## 2023-01-14 DIAGNOSIS — Z7985 Long-term (current) use of injectable non-insulin antidiabetic drugs: Secondary | ICD-10-CM

## 2023-01-14 LAB — POCT GLYCOSYLATED HEMOGLOBIN (HGB A1C): Hemoglobin A1C: 6.8 % — AB (ref 4.0–5.6)

## 2023-01-14 LAB — MICROALBUMIN / CREATININE URINE RATIO
Creatinine,U: 129.8 mg/dL
Microalb Creat Ratio: 2 mg/g (ref 0.0–30.0)
Microalb, Ur: 2.5 mg/dL — ABNORMAL HIGH (ref 0.0–1.9)

## 2023-01-14 MED ORDER — METFORMIN HCL ER 500 MG PO TB24: 1000.0000 mg | ORAL_TABLET | Freq: Two times a day (BID) | ORAL | 3 refills | Status: DC

## 2023-01-14 NOTE — Addendum Note (Signed)
Addended by: Pollie Meyer on: 01/14/2023 01:05 PM   Modules accepted: Orders

## 2023-01-14 NOTE — Patient Instructions (Signed)
Please continue: - Metformin 1000 mg 2x a day with meals - Ozempic 1 mg weekly   Please stop at the lab.  Please return in 6 months with your sugar log.

## 2023-01-14 NOTE — Progress Notes (Addendum)
Patient ID: Alice Simpson, female   DOB: 1966-04-30, 57 y.o.   MRN: [id number]   HPI: Alice Simpson is a 57 y.o.-year-old female, returning for follow-up for DM2, dx in 2007, non-insulin-dependent, now controlled, without long-term complications.  Last visit 6 months ago.  Interim history: No increased urination, blurry vision, nausea, chest pain.   She continues to have hip pain and also knee pain for which she gets steroid injections - not since last OV.  She sees orthopedics.  She needs to have R TKR, she needs a BMI <40.  She had strep throat and had a peritonsillar abscess (got IV steroids while in the hospital). Seeing ENT. Sxs resolved. However, now she has bronchitis. She has been off Ozempic for few weeks recently due to her illness and increased stress.  Reviewed HbA1c levels: Lab Results  Component Value Date   HGBA1C 6.0 (A) 07/16/2022   HGBA1C 5.8 (A) 02/02/2022   HGBA1C 5.8 (A) 07/31/2021   HGBA1C 6.7 (A) 03/28/2021   HGBA1C 6.2 (A) 05/28/2020   HGBA1C 6.5 (A) 01/29/2020   HGBA1C 7.4 (A) 07/25/2019   HGBA1C 8.6 (A) 04/25/2019   HGBA1C 6.9 (H) 09/24/2014   HGBA1C 6.9 (H) 09/21/2014  03/20/2019: HbA1c 9.2% 11/16/2018: HbA1c 8.3% 08/09/2018: HbA1c 7.4%  Pt is on a regimen of: - Metformin ER 1500 mg with dinner >> 1000 mg 2x a day with meals - Ozempic 0.5 mg weekly >> not covered anymore >> sent  Bydureon BCise 2 mg weekly (05/2020) - was not approved... >> restarted Ozempic 0.5 mg weekly >> 1 mg weekly We had to stop Jardiance due to enuresis and GERD. We stopped glipizide XL 01/2022.  She was not checking blood sugars at last visit. Still not checking.... Prev. - am: 200-240 >> ... 81, 114-119, 150 >> 76-103 - 2h after b'fast: n/c >> 143 >> n/c - before lunch: n/c >> 93 >> n/c >> 113 - 2h after lunch: <180 >> n/c - before dinner: n/c >> 80 - 2h after dinner: <180 >> n/c >> 139 - bedtime: n/c - nighttime: n/c Lowest sugar was 90 >> 80s >> 81 >> 76  >> 90s; she has hypoglycemia awareness in the 90s. Highest sugar was 450 (steroid inj) >> 380 (steroid inj) >> 150 >> 139 >> 132.  Glucometer: AccuChek x2  Pt's meals are: - Breakfast: bagel, cereal, fruit - Lunch: sandwich - Dinner: chicken, veggies She continues to see nutrition. She has a history of binge eating disorder.  -No CKD, last BUN/creatinine:   Lab Results  Component Value Date   BUN 20 12/13/2022   BUN 9 12/12/2022   CREATININE 0.84 12/13/2022   CREATININE 0.65 12/12/2022  No results found for: "MICRALBCREAT" 01/13/2018: ACR 11.2 10/13/2017: ACR 19.8 On Cozaar 60.  -+ HL; last set of lipids: Lab Results  Component Value Date   CHOL 224 (H) 09/09/2022   HDL 75 09/09/2022   LDLCALC 107 (H) 09/09/2022   LDLDIRECT 132.0 07/25/2019   TRIG 249 (H) 09/09/2022   CHOLHDL 4 07/25/2019  07/16/2022: 212/132/88/110 06/26/2021: 211/207/67/109 05/29/2020: 214/207/77/107  08/09/2018: 224/192/68/118 08/08/2013: 185/151/69/86 On Crestor 40 << 20.  - last eye exam was on 01/2022: No DR, no macular edema reportedly.  She also has vitreous detachment OU and cataracts. She is seeing Dr. Randon Goldsmith.  -No numbness and tingling in her feet.  She saw podiatry for hammertoe.  Last foot exam 02/02/2022.  She had a Lp (a) checked in the last year and this was  in the 300s.  She saw cardiology.  CAC score (08/10/2022) was 0.  Pt has FH of DM in M, F, other siblings.  She also has a history of breast nodules and had radioactive seed implant.  The nodules were noncancerous. Patient has a history of PCOS diagnosed in 2007.  She was on Ortho-micronor (norethindrone) and has withdrawal bleedings every month.  Tried another OCP >> did not feel well on it.  She was on progesterone only OCP.  She tried to stop these but she developed mood changes and she restarted, then stopped again. She had gastric sleeve surgery 09/2014 and she did well afterwards, losing weight and improving her diabetes.  However,  she lost insurance afterwards and started to gain the weight back. She has a history of hip replacement in 2016. She had stress-related alopecia. She has AP 2/2 suspected diverticulitis.  She sees gastroenterology. She is on ABx prn. On Benefiber.  06/26/2021: TSH 2.34  ROS: + see HPI  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   Vertigo    Past Surgical History:  Procedure Laterality Date   BUNIONECTOMY     CESAREAN SECTION     LAPAROSCOPIC GASTRIC SLEEVE RESECTION N/A 09/24/2014   Procedure: LAPAROSCOPIC GASTRIC SLEEVE RESECTION upper endoscopy;  Surgeon: Luretha Murphy, MD;  Location: [facility];  Service: General;  Laterality: N/A;   RADIOACTIVE SEED GUIDED EXCISIONAL BREAST BIOPSY Left 03/31/2019   Procedure: RADIOACTIVE SEED GUIDED EXCISIONAL LEFT BREAST BIOPSY X2;  Surgeon: Luretha Murphy, MD;  Location: [facility];  Service: General;  Laterality: Left;   TOTAL HIP ARTHROPLASTY Right 11/20/2014   TOTAL HIP ARTHROPLASTY Right 11/20/2014   Procedure: TOTAL HIP ARTHROPLASTY ANTERIOR APPROACH;  Surgeon: Marcene Corning, MD;  Location: [facility];  Service: Orthopedics;  Laterality: Right;   wisdom     WISDOM TOOTH EXTRACTION     Social History   Socioeconomic History   Marital status: Single    Spouse name: Not on file   Number of children: 2   Years of education: Not on file   Highest education level: Some college, no degree  Occupational History   Occupation: Disability  Ecologist strain: Not on file   Food insecurity    Worry: Not on file    Inability: Not on file   Transportation needs    Medical: Not on file    Non-medical: Not on file  Tobacco Use   Smoking status:  Current Some Day Smoker, 1 pack per month    Packs/day:     Years:     Pack years:     Types: Cigarettes    Last attempt to quit: 05/18/2004    Years since quitting: 14.9   Smokeless tobacco: Never Used  Substance and Sexual Activity   Alcohol use: Yes    Comment: rarely when does is liquor    Drug use: Yes    Types: Marijuana    Comment: 1/ every 3 months    Sexual activity: Not on file  Lifestyle   Physical activity    Days per week: Not on file    Minutes per  session: Not on file   Stress: Not on file  Relationships   Social connections    Talks on phone: Not on file    Gets together: Not on file    Attends religious service: Not on file    Active member of club or organization: Not on file    Attends meetings of clubs or organizations: Not on file    Relationship status: Not on file   Intimate partner violence    Fear of current or ex partner: Not on file    Emotionally abused: Not on file    Physically abused: Not on file    Forced sexual activity: Not on file  Other Topics Concern   Not on file  Social History Narrative   Left handed   2 cups of caffeine daily    Lives at home alone    Current Outpatient Medications on File Prior to Visit  Medication Sig Dispense Refill   Accu-Chek Softclix Lancets lancets Use as instructed to check blood sugar 1X daily 100 each 3   Blood Glucose Monitoring Suppl (ACCU-CHEK GUIDE) w/Device KIT Use as instructed to check blood sugar 1X daily 1 kit 0   busPIRone (BUSPAR) 10 MG tablet Take 10 mg by mouth 2 (two) times daily.     DULoxetine (CYMBALTA) 60 MG capsule Take 60 mg by mouth daily.     famotidine (PEPCID) 20 MG tablet Take 1 tablet (20 mg total) by mouth daily for 6 days. 6 tablet 0   glucose blood (ACCU-CHEK GUIDE) test strip Use as instructed to check blood sugar 1X daily 100 each 3   LORazepam (ATIVAN) 0.5 MG tablet Take 0.5 mg by mouth every 8 (eight) hours as needed for anxiety.     losartan (COZAAR) 25 MG tablet Take  25 mg by mouth daily.     metFORMIN (GLUCOPHAGE-XR) 500 MG 24 hr tablet Take 2 tablets (1,000 mg total) by mouth 2 (two) times daily. 360 tablet 3   Multiple Vitamin (MULTI-VITAMINS) TABS Take 1 tablet by mouth 2 (two) times daily. Prenatal     OZEMPIC, 1 MG/DOSE, 4 MG/3ML SOPN INJECT 1mg  into THE SKIN ONCE WEEKLY (Patient taking differently: Inject 1 mg into the skin once a week.) 9 mL 3   prenatal vitamin w/FE, FA (PRENATAL 1 + 1) 27-1 MG TABS tablet Take 1 tablet by mouth daily at 12 noon.     rosuvastatin (CRESTOR) 40 MG tablet Take 40 mg by mouth daily.     No current facility-administered medications on file prior to visit.   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   Lactose Intolerance (Gi)    Penicillin G Hives    hives   Prochlorperazine Itching and Anxiety   Family History  Problem Relation Age of Onset   Heart disease Mother    High blood pressure Mother    High Cholesterol Mother    Diabetes Mother    Atrial fibrillation Mother    Diabetes Father    High blood pressure Father    High Cholesterol Father    Diabetes Other    Depression Other    Anxiety disorder Other    Heart disease Half-Sister    Heart attack Half-Sister    Breast cancer Neg Hx    PE: BP 120/76   Pulse 79   Ht 5\' 8"  (1.727 m)   Wt 277 lb 12.8  oz (126 kg)   SpO2 97%   BMI 42.24 kg/m  Wt Readings from Last 3 Encounters:  01/14/23 277 lb 12.8 oz (126 kg)  12/21/22 270 lb (122.5 kg)  12/13/22 280 lb 6.8 oz (127.2 kg)   Constitutional: overweight, in NAD Eyes: EOMI, no exophthalmos ENT: no thyromegaly, no cervical lymphadenopathy Cardiovascular: Tachycardia, RR, No MRG Respiratory: CTA B Musculoskeletal: no deformities Skin: no rashes Neurological: no tremor with outstretched hands  ASSESSMENT: 1. DM2, non-insulin-dependent, now controlled, without long-term complications but with hyperglycemia  She does  not have a family history of medullary thyroid cancer or personal history of pancreatitis.   2. HL  3.  Obesity class III  PLAN:  1. Patient with longstanding, uncontrolled type 2 diabetes, with better control lately specially after being able to start a GLP-1 receptor agonist.  She is also on metformin.  Latest HbA1c was slightly higher, but still excellent, at 6.0%, 6 months ago.  She was not checking blood sugars at that time and I sent a prescription for new meter to her pharmacy.  Discussed about taking once a day and rotate checked times.  We did not change her regimen at that time. -At today's visit, she is not checking blood sugars.  She did have some checks prior to her getting sick and they appeared to be at goal.  We again discussed about the importance of checking every day or at least every other day, but for now, I did not suggest to change her regimen.  Based on the HbA1c sugars appear to be higher, most likely due to her recent illness and hospitalization. - I suggested to:  Patient Instructions  Please continue: - Metformin 1000 mg 2x a day with meals - Ozempic 1 mg weekly   Please return in 6 months with your sugar log.   - we checked her HbA1c: 6.8% (higher) - advised to check sugars at different times of the day - 1x a day, rotating check times - advised for yearly eye exams >> she is UTD - will check an ACR today - return to clinic in 6 months  2. HL -Reviewed latest lipid panel from 06/2022: LDL above goal -She is on Crestor 40 mg daily without side effects, dose increased after the above results returned -She has a very high Lp(a) >> sees cardiology.  She does have family history of cardiovascular disease.  3.  Obesity class III -She has a history of gastric bypass in 2016, after which her sugars improved significantly but she was not able to follow-up with bariatrics due to loss of insurance.  She started to gain weight afterwards, especially after her hip  replacement surgery, but she then started to lose weight. -Will continue Ozempic which should also help with weight loss -She lost 8 pounds before the last 3 visits combined and gained 4 lbs since then -For right TKR, weight target is less than 260 pounds   Component     Latest Ref Rng 01/14/2023  Hemoglobin A1C     4.0 - 5.6 % 6.8 !   Microalb, Ur     0.0 - 1.9 mg/dL 2.5 (H)   Creatinine,U     mg/dL 409.8   MICROALB/CREAT RATIO     0.0 - 30.0 mg/g 2.0    Normal ACR.  Carlus Pavlov, MD PhD [facility] Endocr[facility]

## 2023-06-14 ENCOUNTER — Telehealth: Payer: Self-pay | Admitting: Diagnostic Neuroimaging

## 2023-06-14 NOTE — Telephone Encounter (Signed)
Pt needed to r/s due to a conflict

## 2023-07-15 ENCOUNTER — Ambulatory Visit: Payer: Medicaid Other | Admitting: Internal Medicine

## 2023-07-15 ENCOUNTER — Encounter: Payer: Self-pay | Admitting: Internal Medicine

## 2023-07-15 VITALS — BP 118/64 | HR 65 | Ht 68.0 in | Wt 269.8 lb

## 2023-07-15 DIAGNOSIS — E782 Mixed hyperlipidemia: Secondary | ICD-10-CM | POA: Diagnosis not present

## 2023-07-15 DIAGNOSIS — Z7984 Long term (current) use of oral hypoglycemic drugs: Secondary | ICD-10-CM | POA: Diagnosis not present

## 2023-07-15 DIAGNOSIS — E1165 Type 2 diabetes mellitus with hyperglycemia: Secondary | ICD-10-CM | POA: Diagnosis not present

## 2023-07-15 DIAGNOSIS — Z7985 Long-term (current) use of injectable non-insulin antidiabetic drugs: Secondary | ICD-10-CM

## 2023-07-15 LAB — POCT GLYCOSYLATED HEMOGLOBIN (HGB A1C): Hemoglobin A1C: 6.2 % — AB (ref 4.0–5.6)

## 2023-07-15 NOTE — Patient Instructions (Addendum)
 Please continue: - Metformin 1000 mg 2x a day with meals - Ozempic 1 mg weekly   Check sugars 1x a day.  Please return in 6 months with your sugar log.

## 2023-07-15 NOTE — Progress Notes (Signed)
 Patient ID: Alice Simpson, female   DOB: [birth year], 58 y.o.   MRN: [id number]   HPI: Alice Simpson is a 58 y.o.-year-old female, returning for follow-up for DM2, dx in 2007, non-insulin-dependent, now controlled, without long-term complications.  Last visit 6 months ago.  Interim history: No increased urination, blurry vision (but cannot see well when driving), nausea, chest pain.   She continues to have hip pain and also knee pain for which she gets steroid injections.  She sees orthopedics.  She needs to have R TKR, she needs a BMI <40 and the weight lower than 260.  She has head tremors. She will see neurology.  Reviewed HbA1c levels: Lab Results  Component Value Date   HGBA1C 6.8 (A) 01/14/2023   HGBA1C 6.0 (A) 07/16/2022   HGBA1C 5.8 (A) 02/02/2022   HGBA1C 5.8 (A) 07/31/2021   HGBA1C 6.7 (A) 03/28/2021   HGBA1C 6.2 (A) 05/28/2020   HGBA1C 6.5 (A) 01/29/2020   HGBA1C 7.4 (A) 07/25/2019   HGBA1C 8.6 (A) 04/25/2019   HGBA1C 6.9 (H) 09/24/2014  03/20/2019: HbA1c 9.2% 11/16/2018: HbA1c 8.3% 08/09/2018: HbA1c 7.4%  Pt is on a regimen of: - Metformin ER 1500 mg with dinner >> 1000 mg 2x a day with meals - Ozempic 0.5 mg weekly >> not covered anymore >> sent  Bydureon BCise 2 mg weekly (05/2020) - was not approved... >> restarted Ozempic 0.5 mg weekly >> 1 mg weekly We had to stop Jardiance due to enuresis and GERD. We stopped glipizide XL 01/2022.  She was not checking blood sugars at last visits.  Still not checking... - am: 200-240 >> ... 81, 114-119, 150 >> 76-103 - 2h after b'fast: n/c >> 143 >> n/c - before lunch: n/c >> 93 >> n/c >> 113 - 2h after lunch: <180 >> n/c - before dinner: n/c >> 80 - 2h after dinner: <180 >> n/c >> 139 - bedtime: n/c - nighttime: n/c Lowest sugar was 76 >> 90s >> ?; she has hypoglycemia awareness in the 90s. Highest sugar was 450 (steroid inj) >> 380 (steroid inj) >> 132 >> ?Marland Kitchen  Glucometer: AccuChek x2  Pt's meals are: -  Breakfast: bagel, cereal, fruit - Lunch: sandwich - Dinner: chicken, veggies She continues to see nutrition. She has a history of binge eating disorder.  -No CKD, last BUN/creatinine:   Lab Results  Component Value Date   BUN 20 12/13/2022   BUN 9 12/12/2022   CREATININE 0.84 12/13/2022   CREATININE 0.65 12/12/2022   Lab Results  Component Value Date   MICRALBCREAT 2.0 01/14/2023  On Cozaar 60.  -+ HL; last set of lipids: Lab Results  Component Value Date   CHOL 224 (H) 09/09/2022   HDL 75 09/09/2022   LDLCALC 107 (H) 09/09/2022   LDLDIRECT 132.0 07/25/2019   TRIG 249 (H) 09/09/2022   CHOLHDL 4 07/25/2019  On Crestor 40 << 20.  - last eye exam was on 01/2023: No DR, no macular edema reportedly.  She also has vitreous detachment OU and cataracts. She is seeing Dr. Randon Goldsmith.  -No numbness and tingling in her feet.  She saw podiatry for hammertoe.  Last foot exam 01/14/2023.  She had a Lp (a) checked in the last year and this was in the 300s.  She saw cardiology.  CAC score (08/10/2022) was 0.  Pt has FH of DM in M, F, other siblings.  She also has a history of breast nodules and had radioactive seed implant.  The nodules  were noncancerous. Patient has a history of PCOS diagnosed in 2007.  She was on Ortho-micronor (norethindrone) and has withdrawal bleedings every month.  Tried another OCP >> did not feel well on it.  She was on progesterone only OCP.  She tried to stop these but she developed mood changes and she restarted, then stopped again. She had gastric sleeve surgery 09/2014 and she did well afterwards, losing weight and improving her diabetes.  However, she lost insurance afterwards and started to gain the weight back. She has a history of hip replacement in 2016. She had stress-related alopecia. She has AP 2/2 suspected diverticulitis.  She sees gastroenterology. She is on ABx prn. On Benefiber.  06/26/2021: TSH 2.34  ROS: + see HPI  Past Medical History:   Diagnosis Date   ADD (attention deficit disorder)    Anxiety    Arthritis    Depression    Diabetes mellitus without complication (HCC)    Hyperlipidemia    Migraine    Neuropathy    Neuropathy    Osteoarthritis    PCOS (polycystic ovarian syndrome)    Pulsatile tinnitus 07/06/2018   Vertigo    Past Surgical History:  Procedure Laterality Date   BUNIONECTOMY     CESAREAN SECTION     LAPAROSCOPIC GASTRIC SLEEVE RESECTION N/A 09/24/2014   Procedure: LAPAROSCOPIC GASTRIC SLEEVE RESECTION upper endoscopy;  Surgeon: Luretha Murphy, MD;  Location: [facility];  Service: General;  Laterality: N/A;   RADIOACTIVE SEED GUIDED EXCISIONAL BREAST BIOPSY Left 03/31/2019   Procedure: RADIOACTIVE SEED GUIDED EXCISIONAL LEFT BREAST BIOPSY X2;  Surgeon: Luretha Murphy, MD;  Location: [facility];  Service: General;  Laterality: Left;   TOTAL HIP ARTHROPLASTY Right 11/20/2014   TOTAL HIP ARTHROPLASTY Right 11/20/2014   Procedure: TOTAL HIP ARTHROPLASTY ANTERIOR APPROACH;  Surgeon: Marcene Corning, MD;  Location: [facility];  Service: Orthopedics;  Laterality: Right;   wisdom     WISDOM TOOTH EXTRACTION     Social History   Socioeconomic History   Marital status: Single    Spouse name: Not on file   Number of children: 2   Years of education: Not on file   Highest education level: Some college, no degree  Occupational History   Occupation: Disability  Ecologist strain: Not on file   Food insecurity    Worry: Not on file    Inability: Not on file   Transportation needs    Medical: Not on file    Non-medical: Not on file  Tobacco Use   Smoking status: Current Some Day Smoker, 1 pack per month    Packs/day:     Years:     Pack years:     Types: Cigarettes    Last attempt to quit: 05/18/2004    Years since quitting: 14.9   Smokeless tobacco: Never Used  Substance and Sexual Activity   Alcohol use: Yes    Comment: rarely when does is liquor    Drug use: Yes     Types: Marijuana    Comment: 1/ every 3 months    Sexual activity: Not on file  Lifestyle   Physical activity    Days per week: Not on file    Minutes per session: Not on file   Stress: Not on file  Relationships   Social connections    Talks on phone: Not on file    Gets together: Not on file    Attends religious service: Not on  file    Active member of club or organization: Not on file    Attends meetings of clubs or organizations: Not on file    Relationship status: Not on file   Intimate partner violence    Fear of current or ex partner: Not on file    Emotionally abused: Not on file    Physically abused: Not on file    Forced sexual activity: Not on file  Other Topics Concern   Not on file  Social History Narrative   Left handed   2 cups of caffeine daily    Lives at home alone    Current Outpatient Medications on File Prior to Visit  Medication Sig Dispense Refill   Accu-Chek Softclix Lancets lancets Use as instructed to check blood sugar 1X daily 100 each 3   Blood Glucose Monitoring Suppl (ACCU-CHEK GUIDE) w/Device KIT Use as instructed to check blood sugar 1X daily 1 kit 0   busPIRone (BUSPAR) 10 MG tablet Take 10 mg by mouth 2 (two) times daily.     DULoxetine (CYMBALTA) 60 MG capsule Take 60 mg by mouth daily.     glucose blood (ACCU-CHEK GUIDE) test strip Use as instructed to check blood sugar 1X daily 100 each 3   LORazepam (ATIVAN) 0.5 MG tablet Take 0.5 mg by mouth every 8 (eight) hours as needed for anxiety.     losartan (COZAAR) 25 MG tablet Take 25 mg by mouth daily.     metFORMIN (GLUCOPHAGE-XR) 500 MG 24 hr tablet Take 2 tablets (1,000 mg total) by mouth 2 (two) times daily. 360 tablet 3   OZEMPIC, 1 MG/DOSE, 4 MG/3ML SOPN INJECT 1mg  into THE SKIN ONCE WEEKLY 9 mL 3   prenatal vitamin w/FE, FA (PRENATAL 1 + 1) 27-1 MG TABS tablet Take 1 tablet by mouth daily at 12 noon.     rosuvastatin (CRESTOR) 40 MG tablet Take 40 mg by mouth daily.     No current  facility-administered medications on file prior to visit.   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    Penicillin G Hives    hives   Prochlorperazine Itching and Anxiety   Family History  Problem Relation Age of Onset   Heart disease Mother    High blood pressure Mother    High Cholesterol Mother    Diabetes Mother    Atrial fibrillation Mother    Diabetes Father    High blood pressure Father    High Cholesterol Father    Diabetes Other    Depression Other    Anxiety disorder Other    Heart disease Half-Sister    Heart attack Half-Sister    Breast cancer Neg Hx    PE: BP 118/64   Pulse 65   Ht 5\' 8"  (1.727 m)   Wt 269 lb 12.8 oz (122.4 kg)   SpO2 96%   BMI 41.02 kg/m  Wt Readings from Last 3 Encounters:  07/15/23 269 lb 12.8 oz (122.4 kg)  01/14/23 277 lb 12.8 oz (126 kg)  12/21/22 270 lb (122.5 kg)   Constitutional: overweight, in NAD Eyes: EOMI, no exophthalmos ENT: no thyromegaly, no cervical lymphadenopathy Cardiovascular: RRR, No MRG Respiratory: CTA B Musculoskeletal: no deformities Skin: no rashes Neurological: no tremor with outstretched hands  ASSESSMENT: 1. DM2, non-insulin-dependent, now controlled, without  long-term complications but with hyperglycemia  She does not have a family history of medullary thyroid cancer or personal history of pancreatitis.   2. HL  3.  Obesity class III  PLAN:  1. Patient with longstanding, uncontrolled, type 2 diabetes, with better control lately especially after being able to start a GLP-1 receptor agonist.  She also continues metformin.  Last visit HbA1c was higher, at 6.8%, but she was off Ozempic at that time as she was sick.  We discussed about restarting it when she feels better.  She was also not checking blood sugars and I recommended to check every day or at least  every other day but I did not suggest a change in the regimen at that time. -At today's visit she is still not checking any sugars.  I advised her to at least check in the 1 to 2 weeks before coming here but ideally she would check once a day rotating check times.  We will not change her regimen for now.  - I suggested to:  Patient Instructions  Please continue: - Metformin 1000 mg 2x a day with meals - Ozempic 1 mg weekly   Check sugars 1x a day.  Please return in 6 months with your sugar log.   - we checked her HbA1c: 6.2% (lower) - advised to check sugars at different times of the day - 1x a day, rotating check times - advised for yearly eye exams >> she is UTD - return to clinic in 6 months  2. HL -Latest lipid panel from 08/2022: LDL and triglycerides above target -She is on Crestor 40 mg daily without side effects.  The dose was increased after the above results returned. -She has a very high LP(a) and sees cardiology.  She does have a family history of cardiovascular disease.  3.  Obesity class III -She has a history of gastric bypass in 2016, after which her sugars improved significantly but she was not able to follow-up with bariatrics due to loss of insurance.  She started to gain weight afterwards, especially after her hip replacement surgery, but she then started to lose weight. -Will continue Ozempic which should also help with weight loss -She 4 pounds before last visit -To have right TKR, her weight loss target is 260 pounds.  Carlus Pavlov, MD PhD [facility]

## 2023-07-30 ENCOUNTER — Ambulatory Visit: Payer: Medicaid Other | Admitting: Diagnostic Neuroimaging

## 2023-08-25 ENCOUNTER — Encounter: Payer: Self-pay | Admitting: Internal Medicine

## 2023-08-25 LAB — MICROALBUMIN / CREATININE URINE RATIO: Microalb Creat Ratio: 14

## 2023-08-27 MED ORDER — OZEMPIC (2 MG/DOSE) 8 MG/3ML ~~LOC~~ SOPN: 2.0000 mg | PEN_INJECTOR | SUBCUTANEOUS | 3 refills | Status: DC

## 2023-08-30 ENCOUNTER — Other Ambulatory Visit: Payer: Self-pay | Admitting: Family Medicine

## 2023-08-30 DIAGNOSIS — Z1231 Encounter for screening mammogram for malignant neoplasm of breast: Secondary | ICD-10-CM

## 2023-09-15 ENCOUNTER — Ambulatory Visit
Admission: RE | Admit: 2023-09-15 | Discharge: 2023-09-15 | Disposition: A | Discharge status: Home / Self Care | Source: Ambulatory Visit | Attending: Family Medicine | Admitting: Family Medicine

## 2023-09-15 DIAGNOSIS — Z1231 Encounter for screening mammogram for malignant neoplasm of breast: Secondary | ICD-10-CM

## 2023-09-27 ENCOUNTER — Encounter: Payer: Self-pay | Admitting: Diagnostic Neuroimaging

## 2023-09-27 ENCOUNTER — Ambulatory Visit: Payer: Medicaid Other | Admitting: Diagnostic Neuroimaging

## 2023-09-27 VITALS — BP 128/80 | HR 68 | Ht 67.5 in | Wt 264.8 lb

## 2023-09-27 DIAGNOSIS — G250 Essential tremor: Secondary | ICD-10-CM | POA: Diagnosis not present

## 2023-09-27 NOTE — Patient Instructions (Signed)
 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

## 2023-09-27 NOTE — Progress Notes (Signed)
 [facility]  PATIENT: Alice Simpson DOB: Feb 03, 1966  REFERRING CLINICIAN: Ronna Coho, MD HISTORY FROM: patient  REASON FOR VISIT: new consult   HISTORICAL  CHIEF COMPLAINT:  Chief Complaint  Patient presents with   New Patient (Initial Visit)    RM 6, Pt alone. Pt states is here due to essential tremors, referred by PCP. States primarily head tremors, hasn't noticed in either hand. Pt states has had tinnitus for years and has had vertigo.    HISTORY OF PRESENT ILLNESS:   58 year old female here for evaluation of tremor.  Patient has noticed some very mild head tremor over the past 1 year, mainly when she is looking down.  Symptoms worse with stress.  Family history of hand tremor in mother.  Patient denies any tremor in her own hands.  Symptoms are not disabling at this time.   REVIEW OF SYSTEMS: Full 14 system review of systems performed and negative with exception of: as per HPI.  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 1.5 tabs two times daily     DULoxetine  (CYMBALTA ) 60 MG capsule Take 60 mg by mouth daily.     glucose blood (ACCU-CHEK GUIDE) test strip Use as instructed to check blood sugar 1X daily 100 each 3   LORazepam  (ATIVAN ) 0.5 MG tablet Take 0.5 mg by mouth every 8 (eight) hours as needed for anxiety.     losartan  (COZAAR ) 25 MG tablet Take 25 mg by mouth daily.     metFORMIN  (GLUCOPHAGE -XR)  500 MG 24 hr tablet Take 2 tablets (1,000 mg total) by mouth 2 (two) times daily. 360 tablet 3   prenatal vitamin w/FE, FA (PRENATAL 1 + 1) 27-1 MG TABS tablet Take 1 tablet by mouth daily at 12 noon.     rosuvastatin  (CRESTOR ) 40 MG tablet Take 40 mg by mouth daily.     Semaglutide , 2 MG/DOSE, (OZEMPIC , 2 MG/DOSE,) 8 MG/3ML SOPN Inject 2 mg into the skin once a week. 9 mL 3   No facility-administered medications prior to visit.    PAST MEDICAL HISTORY: 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   Vertigo     PAST SURGICAL HISTORY: Past Surgical History:  Procedure Laterality Date   BUNIONECTOMY     CESAREAN SECTION     LAPAROSCOPIC GASTRIC SLEEVE RESECTION N/A 09/24/2014   Procedure: LAPAROSCOPIC GASTRIC SLEEVE RESECTION upper endoscopy;  Surgeon: Jacolyn Matar, MD;  Location: [facility];  Service: General;  Laterality: N/A;   RADIOACTIVE SEED GUIDED EXCISIONAL BREAST BIOPSY Left 03/31/2019   Procedure: RADIOACTIVE  SEED GUIDED EXCISIONAL LEFT BREAST BIOPSY X2;  Surgeon: Jacolyn Matar, MD;  Location:  SURGERY CENTER;  Service: General;  Laterality: Left;   TOTAL HIP ARTHROPLASTY Right 11/20/2014   TOTAL HIP ARTHROPLASTY Right 11/20/2014   Procedure: TOTAL HIP ARTHROPLASTY ANTERIOR APPROACH;  Surgeon: Dayne Even, MD;  Location: [facility];  Service: Orthopedics;  Laterality: Right;   TRIGGER FINGER RELEASE Right    Right thumb   wisdom     WISDOM TOOTH EXTRACTION      FAMILY HISTORY: Family History  Problem Relation Age of Onset   Heart disease Mother    High blood pressure Mother    High Cholesterol Mother    Diabetes Mother    Atrial fibrillation Mother    Diabetes Father    High blood pressure  Father    High Cholesterol Father    Diabetes Other    Depression Other    Anxiety disorder Other    Heart disease Half-Sister    Heart attack Half-Sister    Breast cancer Neg Hx     SOCIAL HISTORY: Social History   Socioeconomic History   Marital status: Single    Spouse name: Not on file   Number of children: 2   Years of education: Not on file   Highest education level: Some college, no degree  Occupational History   Occupation: Disability  Tobacco Use   Smoking status: Every Day    Current packs/day: 0.50    Average packs/day: 0.9 packs/day for 32.4 years (28.7 ttl pk-yrs)    Types: Cigarettes    Start date: 05/19/1979    Last attempt to quit: 05/18/2004   Smokeless tobacco: Never   Tobacco comments:    Patient had quit smoking for 13 years and then started back.   Vaping Use   Vaping status: Never Used  Substance and Sexual Activity   Alcohol use: Not Currently    Comment: rarely when does is liquor    Drug use: Not Currently    Types: Marijuana    Comment: 1/ every 3 months    Sexual activity: Not on file  Other Topics Concern   Not on file  Social History Narrative   Left handed   2 cups of caffeine daily    Lives at home alone    Social Drivers of Health   Financial Resource Strain: Not on file  Food Insecurity: No Food Insecurity (12/11/2022)   Hunger Vital Sign    Worried About Running Out of Food in the Last Year: Never true    Ran Out of Food in the Last Year: Never true  Transportation Needs: No Transportation Needs (12/11/2022)   PRAPARE - Administrator, Civil Service (Medical): No    Lack of Transportation (Non-Medical): No  Physical Activity: Not on file  Stress: Not on file  Social Connections: Unknown (08/04/2022)   Received from [facility]   Social Network    Social Network: Not on file  Intimate Partner Violence: Not At Risk (12/11/2022)   Humiliation, Afraid, Rape, and Kick questionnaire    Fear of Current or  Ex-Partner: No    Emotionally Abused: No    Physically Abused: No    Sexually Abused: No     PHYSICAL EXAM  GENERAL EXAM/CONSTITUTIONAL: Vitals:  Vitals:   09/27/23 0926  BP: 128/80  Pulse: 68  Weight: 264 lb 12.8 oz (120.1 kg)  Height: 5' 7.5" (1.715 m)   Body mass index is 40.86  kg/m. Wt Readings from Last 3 Encounters:  09/27/23 264 lb 12.8 oz (120.1 kg)  07/15/23 269 lb 12.8 oz (122.4 kg)  01/14/23 277 lb 12.8 oz (126 kg)   Patient is in no distress; well developed, nourished and groomed; neck is supple  CARDIOVASCULAR: Examination of carotid arteries is normal; no carotid bruits Regular rate and rhythm, no murmurs Examination of peripheral vascular system by observation and palpation is normal  EYES: Ophthalmoscopic exam of optic discs and posterior segments is normal; no papilledema or hemorrhages No results found.  MUSCULOSKELETAL: Gait, strength, tone, movements noted in Neurologic exam below  NEUROLOGIC: MENTAL STATUS:      No data to display         awake, alert, oriented to person, place and time recent and remote memory intact normal attention and concentration language fluent, comprehension intact, naming intact fund of knowledge appropriate  CRANIAL NERVE:  2nd - no papilledema on fundoscopic exam 2nd, 3rd, 4th, 6th - pupils equal and reactive to light, visual fields full to confrontation, extraocular muscles intact, no nystagmus 5th - facial sensation symmetric 7th - facial strength symmetric 8th - hearing intact 9th - palate elevates symmetrically, uvula midline 11th - shoulder shrug symmetric 12th - tongue protrusion midline MILD POSTURAL HEAD TREMOR  MOTOR:  normal bulk and tone, full strength in the BUE, BLE; EXCEPT LIMITED (4) IN RIGHT LEG DUE TO RIGHT HIP AND KNEE PAIN  SENSORY:  normal and symmetric to light touch, temperature, vibration  COORDINATION:  finger-nose-finger, fine finger movements normal  REFLEXES:  deep  tendon reflexes TRACE and symmetric  GAIT/STATION:  narrow based gait     DIAGNOSTIC DATA (LABS, IMAGING, TESTING) - I reviewed patient records, labs, notes, testing and imaging myself where available.  Lab Results  Component Value Date   WBC 15.4 (H) 12/13/2022   HGB 11.9 (L) 12/13/2022   HCT 35.6 (L) 12/13/2022   MCV 81.3 12/13/2022   PLT 288 12/13/2022      Component Value Date/Time   NA 135 12/13/2022 0204   NA 140 07/06/2018 0912   K 4.3 12/13/2022 0204   CL 102 12/13/2022 0204   CO2 22 12/13/2022 0204   GLUCOSE 354 (H) 12/13/2022 0204   BUN 20 12/13/2022 0204   BUN 8 07/06/2018 0912   CREATININE 0.84 12/13/2022 0204   CALCIUM  9.1 12/13/2022 0204   PROT 6.1 (L) 12/13/2022 0204   PROT 7.1 07/06/2018 0912   ALBUMIN 2.7 (L) 12/13/2022 0204   ALBUMIN 4.5 07/06/2018 0912   AST 20 12/13/2022 0204   ALT 19 12/13/2022 0204   ALKPHOS 82 12/13/2022 0204   BILITOT 0.5 12/13/2022 0204   BILITOT 0.4 07/06/2018 0912   GFRNONAA >60 12/13/2022 0204   GFRAA 116 07/06/2018 0912   Lab Results  Component Value Date   CHOL 224 (H) 09/09/2022   HDL 75 09/09/2022   LDLCALC 107 (H) 09/09/2022   LDLDIRECT 132.0 07/25/2019   TRIG 249 (H) 09/09/2022   CHOLHDL 4 07/25/2019   Lab Results  Component Value Date   HGBA1C 6.2 (A) 07/15/2023   No results found for: "VITAMINB12" No results found for: "TSH"     ASSESSMENT AND PLAN  58 y.o. year old female here with:  Dx:  1. Essential tremor     PLAN:  MILD INTERMITTENT HEAD TREMOR (likely mild essential tremor) - monitor for now; consider propranolol or primidone in future if symptoms significantly worsen  Return for return to PCP, pending if symptoms worsen  or fail to improve.    Omega Bible, MD 09/27/2023, 9:51 AM Certified in Neurology, Neurophysiology and Neuroimaging  [facility] [address] [place], [place] [address] [phone]

## 2023-11-25 ENCOUNTER — Other Ambulatory Visit: Payer: Self-pay | Admitting: Internal Medicine

## 2024-01-12 ENCOUNTER — Encounter: Payer: Self-pay | Admitting: Internal Medicine

## 2024-01-12 ENCOUNTER — Ambulatory Visit: Payer: Medicaid Other | Admitting: Internal Medicine

## 2024-01-12 VITALS — BP 118/64 | HR 75 | Ht 67.5 in | Wt 258.6 lb

## 2024-01-12 DIAGNOSIS — E782 Mixed hyperlipidemia: Secondary | ICD-10-CM

## 2024-01-12 DIAGNOSIS — Z7984 Long term (current) use of oral hypoglycemic drugs: Secondary | ICD-10-CM

## 2024-01-12 DIAGNOSIS — E1165 Type 2 diabetes mellitus with hyperglycemia: Secondary | ICD-10-CM | POA: Diagnosis not present

## 2024-01-12 DIAGNOSIS — Z7985 Long-term (current) use of injectable non-insulin antidiabetic drugs: Secondary | ICD-10-CM | POA: Diagnosis not present

## 2024-01-12 LAB — POCT GLYCOSYLATED HEMOGLOBIN (HGB A1C): Hemoglobin A1C: 5.8 % — AB (ref 4.0–5.6)

## 2024-01-12 NOTE — Patient Instructions (Addendum)
 Please continue: - Metformin  1000 mg 2x a day with meals - Ozempic  2 mg weekly   Please return in 6 months with your sugar log.

## 2024-01-12 NOTE — Progress Notes (Signed)
 Patient ID: Alice Simpson, female   DOB: 07-May-1966, 58 y.o.   MRN: [id number]   HPI: Alice Simpson is a 58 y.o.-year-old female, returning for follow-up for DM2, dx in 2007, non-insulin -dependent, now controlled, without long-term complications.  Last visit 6 months ago.  Interim history: No increased urination, blurry vision (but cannot see well when driving), nausea, chest pain.   She continues to have hip pain and also knee pain for which she gets steroid injections.  She sees orthopedics.  She needs to have R TKR, she needs a BMI <40. Last visit, she saw neurology and was diagnosed with essential tremor.  Reviewed HbA1c levels: Lab Results  Component Value Date   HGBA1C 6.2 (A) 07/15/2023   HGBA1C 6.8 (A) 01/14/2023   HGBA1C 6.0 (A) 07/16/2022   HGBA1C 5.8 (A) 02/02/2022   HGBA1C 5.8 (A) 07/31/2021   HGBA1C 6.7 (A) 03/28/2021   HGBA1C 6.2 (A) 05/28/2020   HGBA1C 6.5 (A) 01/29/2020   HGBA1C 7.4 (A) 07/25/2019   HGBA1C 8.6 (A) 04/25/2019  03/20/2019: HbA1c 9.2% 11/16/2018: HbA1c 8.3% 08/09/2018: HbA1c 7.4%  Pt is on a regimen of: - Metformin  ER 1500 mg with dinner >> 1000 mg 2x a day with meals - Ozempic  0.5 mg weekly >> not covered anymore >> sent  Bydureon  BCise 2 mg weekly (05/2020) - was not approved... >> restarted Ozempic  0.5 mg weekly >> 1 >> 2 mg weekly We had to stop Jardiance due to enuresis and GERD. We stopped glipizide  XL 01/2022.  She was not checking blood sugars at last visits. Now checking  1x a day: - am: 200-240 >> ... 81, 114-119, 150 >> 76-103 >> 76-102, 113 - 2h after b'fast: n/c >> 143 >> n/c - before lunch: n/c >> 93 >> n/c >> 113 >> n/c >> 91-96 - 2h after lunch: <180 >> n/c >> 82, 203 (icecream) - before dinner: n/c >> 80 >> n/c - 2h after dinner: <180 >> n/c >> 139 >> 109-143 - bedtime: n/c - nighttime: n/c Lowest sugar was 76 >> 90s >> 76; she has hypoglycemia awareness in the 90s. Highest sugar was 450 (steroid inj) >> 380  (steroid inj) >> 132 >> 203.  Glucometer: AccuChek x2  She saw nutrition. She has a history of binge eating disorder.  -No CKD, last BUN/creatinine:  08/25/2023: BUN 13, GFR 94, glucose 73 Lab Results  Component Value Date   BUN 20 12/13/2022   BUN 9 12/12/2022   CREATININE 0.84 12/13/2022   CREATININE 0.65 12/12/2022   08/25/2023: ACR 14 No results found for: MICRALBCREAT On Cozaar  60.  -+ HL; last set of lipids: 08/25/2023: 182/127/83/77 Lab Results  Component Value Date   CHOL 224 (H) 09/09/2022   HDL 75 09/09/2022   LDLCALC 107 (H) 09/09/2022   LDLDIRECT 132.0 07/25/2019   TRIG 249 (H) 09/09/2022   CHOLHDL 4 07/25/2019  On Crestor  40 << 20.  - last eye exam was on 01/2023: No DR, no macular edema reportedly.  She also has vitreous detachment OU and cataracts. She is seeing Dr. Charmayne.  -No numbness and tingling in her feet.  She saw podiatry for hammertoe in the past.  Last foot exam 01/14/2023.  She had a Lp (a) checked in the last year and this was in the 300s.  She saw cardiology.  CAC score (08/10/2022) was 0.  Pt has FH of DM in M, F, other siblings.  She also has a history of breast nodules and had radioactive  seed implant.  The nodules were noncancerous. Patient has a history of PCOS diagnosed in 2007.  She was on Ortho-micronor  (norethindrone ) and has withdrawal bleedings every month.  Tried another OCP >> did not feel well on it.  She was on progesterone only OCP.  She tried to stop these but she developed mood changes and she restarted, then stopped again. She had gastric sleeve surgery 09/2014 and she did well afterwards, losing weight and improving her diabetes.  However, she lost insurance afterwards and started to gain the weight back. She has a history of hip replacement in 2016. She had stress-related alopecia. She has AP 2/2 suspected diverticulitis.  She sees gastroenterology. She is on ABx prn. On Benefiber.  06/26/2021: TSH 2.34  ROS: + see HPI  Past  Medical History:  Diagnosis Date   ADD (attention deficit disorder)    Anxiety    Arthritis    Depression    Diabetes mellitus without complication (HCC)    Hyperlipidemia    Migraine    Neuropathy    Neuropathy    Osteoarthritis    PCOS (polycystic ovarian syndrome)    Pulsatile tinnitus 07/06/2018   Vertigo    Past Surgical History:  Procedure Laterality Date   BUNIONECTOMY     CESAREAN SECTION     LAPAROSCOPIC GASTRIC SLEEVE RESECTION N/A 09/24/2014   Procedure: LAPAROSCOPIC GASTRIC SLEEVE RESECTION upper endoscopy;  Surgeon: Donnice Lunger, MD;  Location: [facility];  Service: General;  Laterality: N/A;   RADIOACTIVE SEED GUIDED EXCISIONAL BREAST BIOPSY Left 03/31/2019   Procedure: RADIOACTIVE SEED GUIDED EXCISIONAL LEFT BREAST BIOPSY X2;  Surgeon: Lunger Donnice, MD;  Location: [facility];  Service: General;  Laterality: Left;   TOTAL HIP ARTHROPLASTY Right 11/20/2014   TOTAL HIP ARTHROPLASTY Right 11/20/2014   Procedure: TOTAL HIP ARTHROPLASTY ANTERIOR APPROACH;  Surgeon: Maude Herald, MD;  Location: [facility];  Service: Orthopedics;  Laterality: Right;   TRIGGER FINGER RELEASE Right    Right thumb   wisdom     WISDOM TOOTH EXTRACTION     Social History   Socioeconomic History   Marital status: Single    Spouse name: Not on file   Number of children: 2   Years of education: Not on file   Highest education level: Some college, no degree  Occupational History   Occupation: Disability  Ecologist strain: Not on file   Food insecurity    Worry: Not on file    Inability: Not on file   Transportation needs    Medical: Not on file    Non-medical: Not on file  Tobacco Use   Smoking status: Current Some Day Smoker, 1 pack per month    Packs/day:     Years:     Pack years:     Types: Cigarettes    Last attempt to quit: 05/18/2004    Years since quitting: 14.9   Smokeless tobacco: Never Used  Substance and Sexual Activity   Alcohol  use: Yes    Comment: rarely when does is liquor    Drug use: Yes    Types: Marijuana    Comment: 1/ every 3 months    Sexual activity: Not on file  Lifestyle   Physical activity    Days per week: Not on file    Minutes per session: Not on file   Stress: Not on file  Relationships   Social connections    Talks on phone: Not on file  Gets together: Not on file    Attends religious service: Not on file    Active member of club or organization: Not on file    Attends meetings of clubs or organizations: Not on file    Relationship status: Not on file   Intimate partner violence    Fear of current or ex partner: Not on file    Emotionally abused: Not on file    Physically abused: Not on file    Forced sexual activity: Not on file  Other Topics Concern   Not on file  Social History Narrative   Left handed   2 cups of caffeine daily    Lives at home alone    Current Outpatient Medications on File Prior to Visit  Medication Sig Dispense Refill   Accu-Chek Softclix Lancets lancets Use as instructed to check blood sugar 1X daily 100 each 3   Blood Glucose Monitoring Suppl (ACCU-CHEK GUIDE) w/Device KIT Use as instructed to check blood sugar 1X daily 1 kit 0   busPIRone  (BUSPAR ) 15 MG tablet Take 15 mg by mouth 2 (two) times daily. 1.5 tabs two times daily     DULoxetine  (CYMBALTA ) 60 MG capsule Take 60 mg by mouth daily.     glucose blood (ACCU-CHEK GUIDE) test strip Use as instructed to check blood sugar 1X daily 100 each 3   LORazepam  (ATIVAN ) 0.5 MG tablet Take 0.5 mg by mouth every 8 (eight) hours as needed for anxiety.     losartan  (COZAAR ) 25 MG tablet Take 25 mg by mouth daily.     metFORMIN  (GLUCOPHAGE -XR) 500 MG 24 hr tablet TAKE 2 TABLETS BY MOUTH 2 TIMES DAILY 360 tablet 3   prenatal vitamin w/FE, FA (PRENATAL 1 + 1) 27-1 MG TABS tablet Take 1 tablet by mouth daily at 12 noon.     rosuvastatin  (CRESTOR ) 40 MG tablet Take 40 mg by mouth daily.     Semaglutide , 2 MG/DOSE,  (OZEMPIC , 2 MG/DOSE,) 8 MG/3ML SOPN Inject 2 mg into the skin once a week. 9 mL 3   No current facility-administered medications on file prior to visit.   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   Penicillin G Hives    hives   Prochlorperazine Itching and Anxiety   Family History  Problem Relation Age of Onset   Heart disease Mother    High blood pressure Mother    High Cholesterol Mother    Diabetes Mother    Atrial fibrillation Mother    Diabetes Father    High blood pressure Father    High Cholesterol Father    Diabetes Other    Depression Other    Anxiety disorder Other    Heart disease Half-Sister    Heart attack Half-Sister    Breast cancer Neg Hx    PE: BP 118/64   Pulse 75   Ht 5' 7.5 (1.715 m)   Wt 258 lb 9.6 oz (117.3 kg)   SpO2 96%   BMI 39.90 kg/m  Wt Readings from Last 3 Encounters:  01/12/24 258 lb 9.6 oz (117.3 kg)  09/27/23 264 lb 12.8 oz (120.1 kg)  07/15/23 269 lb 12.8 oz (122.4 kg)   Constitutional: overweight, in NAD Eyes: EOMI, no exophthalmos ENT: no thyromegaly, no cervical lymphadenopathy Cardiovascular: RRR, No MRG Respiratory: CTA B Musculoskeletal: no deformities Skin: no rashes Neurological: no tremor with outstretched hands Diabetic Foot  Exam - Simple   Simple Foot Form Diabetic Foot exam was performed with the following findings: Yes 01/12/2024  9:37 AM  Visual Inspection See comments: Yes Sensation Testing Intact to touch and monofilament testing bilaterally: Yes Pulse Check Posterior Tibialis and Dorsalis pulse intact bilaterally: Yes Comments Hammertoe deformity in 2nd L toe    ASSESSMENT: 1. DM2, non-insulin -dependent, now controlled, without long-term complications but with hyperglycemia  She does not have a family history of medullary thyroid cancer or personal history of pancreatitis.   2. HL  3.  Obesity class III  PLAN:  1.  Patient with longstanding, previously uncontrolled, type 2 diabetes, with better control lately, especially after starting the GLP-1 receptor agonist.  She also continues metformin .  At last visit HbA1c improved to 6.2%.  She was not checking blood sugars and I advised her to try to check every day but at least every other day and getting as many data as possible in the 1 to 2 weeks before our visits we did not change her regimen at that time. -At today's visit, she did not Lyumjev checking blood sugars in the last 2 weeks and she brings her meter.  Sugars are mostly at goal, with 1 exception in the 200s, after ice cream.  For now, I did not recommend a change in regimen especially as her HbA1c is lower than before). - I suggested to:  Patient Instructions  Please continue: - Metformin  1000 mg 2x a day with meals - Ozempic  2 mg weekly   Please return in 6 months with your sugar log.   - we checked her HbA1c:  5.8% (lower) - advised to check sugars at different times of the day - 1x a day, rotating check times - advised for yearly eye exams >> she is UTD - return to clinic in 6 months  2. HL - Reviewed latest lipid panel from 08/25/2023: 182/127/83/77-fractions at goal - she has a very high LP(a) and sees cardiology.  She also has family history of cardiovascular disease - Continues Crestor  40 mg daily without side effects.  3.  Obesity class III -She has a history of gastric bypass in 2016, after which her sugars improved significantly but she was not able to follow-up with bariatrics due to loss of insurance.  She started to gain weight afterwards, especially after her hip replacement surgery, but she then started to lose weight. - Will continue Ozempic  which should also help with weight loss; we increased the dose since last visit to further help with her weight loss - To have right TKR, weight loss target is a BMI of <40.  In the clinic today, her BMI is 39.9. - She lost 7 more pounds since  last visit  Lela Fendt, MD PhD [facility]

## 2024-05-09 ENCOUNTER — Other Ambulatory Visit: Payer: Self-pay | Admitting: Internal Medicine

## 2024-07-14 ENCOUNTER — Ambulatory Visit: Admitting: Internal Medicine

## 2024-07-25 ENCOUNTER — Ambulatory Visit (HOSPITAL_COMMUNITY): Admit: 2024-07-25 | Admitting: Orthopedic Surgery
